# Patient Record
Sex: Female | Born: 1955
Health system: Southern US, Community
[De-identification: ages and names within clinical notes are randomized; demographics above are authoritative.]

## PROBLEM LIST (undated history)

## (undated) DIAGNOSIS — N870 Mild cervical dysplasia: Secondary | ICD-10-CM

## (undated) DIAGNOSIS — I10 Essential (primary) hypertension: Secondary | ICD-10-CM

## (undated) DIAGNOSIS — R7303 Prediabetes: Secondary | ICD-10-CM

## (undated) DIAGNOSIS — M7989 Other specified soft tissue disorders: Secondary | ICD-10-CM

## (undated) DIAGNOSIS — F32A Depression, unspecified: Secondary | ICD-10-CM

## (undated) DIAGNOSIS — M549 Dorsalgia, unspecified: Secondary | ICD-10-CM

## (undated) DIAGNOSIS — E785 Hyperlipidemia, unspecified: Secondary | ICD-10-CM

## (undated) DIAGNOSIS — M2550 Pain in unspecified joint: Secondary | ICD-10-CM

## (undated) DIAGNOSIS — F3281 Premenstrual dysphoric disorder: Secondary | ICD-10-CM

## (undated) DIAGNOSIS — M722 Plantar fascial fibromatosis: Secondary | ICD-10-CM

## (undated) DIAGNOSIS — E669 Obesity, unspecified: Secondary | ICD-10-CM

## (undated) DIAGNOSIS — K219 Gastro-esophageal reflux disease without esophagitis: Secondary | ICD-10-CM

## (undated) DIAGNOSIS — E7800 Pure hypercholesterolemia, unspecified: Secondary | ICD-10-CM

## (undated) DIAGNOSIS — M25569 Pain in unspecified knee: Secondary | ICD-10-CM

## (undated) DIAGNOSIS — M1990 Unspecified osteoarthritis, unspecified site: Secondary | ICD-10-CM

## (undated) DIAGNOSIS — F329 Major depressive disorder, single episode, unspecified: Secondary | ICD-10-CM

## (undated) DIAGNOSIS — K5900 Constipation, unspecified: Secondary | ICD-10-CM

## (undated) DIAGNOSIS — K589 Irritable bowel syndrome without diarrhea: Secondary | ICD-10-CM

## (undated) HISTORY — DX: Other specified soft tissue disorders: M79.89

## (undated) HISTORY — PX: ANAL FISSURE REPAIR: SHX2312

## (undated) HISTORY — DX: Irritable bowel syndrome, unspecified: K58.9

## (undated) HISTORY — DX: Hypercalcemia: E83.52

## (undated) HISTORY — PX: OTHER SURGICAL HISTORY: SHX169

## (undated) HISTORY — DX: Unspecified osteoarthritis, unspecified site: M19.90

## (undated) HISTORY — PX: TUBAL LIGATION: SHX77

## (undated) HISTORY — DX: Essential (primary) hypertension: I10

## (undated) HISTORY — DX: Plantar fascial fibromatosis: M72.2

## (undated) HISTORY — DX: Prediabetes: R73.03

## (undated) HISTORY — DX: Pain in unspecified knee: M25.569

## (undated) HISTORY — PX: ANKLE SURGERY: SHX546

## (undated) HISTORY — DX: Constipation, unspecified: K59.00

## (undated) HISTORY — DX: Mild cervical dysplasia: N87.0

## (undated) HISTORY — DX: Dorsalgia, unspecified: M54.9

## (undated) HISTORY — DX: Obesity, unspecified: E66.9

## (undated) HISTORY — DX: Depression, unspecified: F32.A

## (undated) HISTORY — DX: Pure hypercholesterolemia, unspecified: E78.00

## (undated) HISTORY — DX: Pain in unspecified joint: M25.50

## (undated) HISTORY — DX: Hyperlipidemia, unspecified: E78.5

## (undated) HISTORY — DX: Gastro-esophageal reflux disease without esophagitis: K21.9

## (undated) HISTORY — DX: Premenstrual dysphoric disorder: F32.81

## (undated) HISTORY — PX: COLPOSCOPY: SHX161

---
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
## 1998-03-22 ENCOUNTER — Other Ambulatory Visit: Admission: RE | Admit: 1998-03-22 | Discharge: 1998-03-22 | Payer: Self-pay | Admitting: Obstetrics and Gynecology

## 1999-03-21 ENCOUNTER — Inpatient Hospital Stay (HOSPITAL_COMMUNITY): Admission: AD | Admit: 1999-03-21 | Discharge: 1999-03-26 | Payer: Self-pay | Admitting: *Deleted

## 1999-05-24 ENCOUNTER — Other Ambulatory Visit: Admission: RE | Admit: 1999-05-24 | Discharge: 1999-05-24 | Payer: Self-pay | Admitting: Obstetrics and Gynecology

## 2000-06-04 ENCOUNTER — Other Ambulatory Visit: Admission: RE | Admit: 2000-06-04 | Discharge: 2000-06-04 | Payer: Self-pay | Admitting: Obstetrics and Gynecology

## 2001-08-20 ENCOUNTER — Encounter (INDEPENDENT_AMBULATORY_CARE_PROVIDER_SITE_OTHER): Payer: Self-pay | Admitting: Specialist

## 2001-08-20 ENCOUNTER — Ambulatory Visit (HOSPITAL_COMMUNITY): Admission: RE | Admit: 2001-08-20 | Discharge: 2001-08-20 | Payer: Self-pay | Admitting: Gastroenterology

## 2001-09-02 ENCOUNTER — Encounter: Payer: Self-pay | Admitting: *Deleted

## 2001-09-02 ENCOUNTER — Encounter: Admission: RE | Admit: 2001-09-02 | Discharge: 2001-09-02 | Payer: Self-pay | Admitting: *Deleted

## 2001-09-11 ENCOUNTER — Other Ambulatory Visit: Admission: RE | Admit: 2001-09-11 | Discharge: 2001-09-11 | Payer: Self-pay | Admitting: Obstetrics and Gynecology

## 2003-03-03 ENCOUNTER — Other Ambulatory Visit: Admission: RE | Admit: 2003-03-03 | Discharge: 2003-03-03 | Payer: Self-pay | Admitting: Obstetrics and Gynecology

## 2003-05-04 ENCOUNTER — Encounter: Payer: Self-pay | Admitting: Emergency Medicine

## 2003-05-04 ENCOUNTER — Emergency Department (HOSPITAL_COMMUNITY): Admission: EM | Admit: 2003-05-04 | Discharge: 2003-05-04 | Payer: Self-pay | Admitting: Emergency Medicine

## 2004-04-27 ENCOUNTER — Other Ambulatory Visit: Admission: RE | Admit: 2004-04-27 | Discharge: 2004-04-27 | Payer: Self-pay | Admitting: Obstetrics and Gynecology

## 2004-08-19 HISTORY — PX: CERVICAL BIOPSY  W/ LOOP ELECTRODE EXCISION: SUR135

## 2004-11-02 ENCOUNTER — Ambulatory Visit (HOSPITAL_COMMUNITY): Admission: RE | Admit: 2004-11-02 | Discharge: 2004-11-02 | Payer: Self-pay | Admitting: Obstetrics and Gynecology

## 2004-11-02 ENCOUNTER — Ambulatory Visit (HOSPITAL_BASED_OUTPATIENT_CLINIC_OR_DEPARTMENT_OTHER): Admission: RE | Admit: 2004-11-02 | Discharge: 2004-11-02 | Payer: Self-pay | Admitting: Obstetrics and Gynecology

## 2004-11-02 ENCOUNTER — Encounter (INDEPENDENT_AMBULATORY_CARE_PROVIDER_SITE_OTHER): Payer: Self-pay | Admitting: *Deleted

## 2005-03-04 ENCOUNTER — Other Ambulatory Visit: Admission: RE | Admit: 2005-03-04 | Discharge: 2005-03-04 | Payer: Self-pay | Admitting: Obstetrics and Gynecology

## 2005-05-09 ENCOUNTER — Ambulatory Visit (HOSPITAL_BASED_OUTPATIENT_CLINIC_OR_DEPARTMENT_OTHER): Admission: RE | Admit: 2005-05-09 | Discharge: 2005-05-09 | Payer: Self-pay | Admitting: General Surgery

## 2005-05-09 ENCOUNTER — Ambulatory Visit (HOSPITAL_COMMUNITY): Admission: RE | Admit: 2005-05-09 | Discharge: 2005-05-09 | Payer: Self-pay | Admitting: General Surgery

## 2005-06-20 ENCOUNTER — Other Ambulatory Visit: Admission: RE | Admit: 2005-06-20 | Discharge: 2005-06-20 | Payer: Self-pay | Admitting: Obstetrics and Gynecology

## 2005-12-06 ENCOUNTER — Other Ambulatory Visit: Admission: RE | Admit: 2005-12-06 | Discharge: 2005-12-06 | Payer: Self-pay | Admitting: Obstetrics and Gynecology

## 2006-07-03 ENCOUNTER — Other Ambulatory Visit: Admission: RE | Admit: 2006-07-03 | Discharge: 2006-07-03 | Payer: Self-pay | Admitting: Obstetrics and Gynecology

## 2007-11-02 ENCOUNTER — Other Ambulatory Visit: Admission: RE | Admit: 2007-11-02 | Discharge: 2007-11-02 | Payer: Self-pay | Admitting: Obstetrics and Gynecology

## 2007-12-10 ENCOUNTER — Other Ambulatory Visit: Admission: RE | Admit: 2007-12-10 | Discharge: 2007-12-10 | Payer: Self-pay | Admitting: Obstetrics and Gynecology

## 2008-06-16 ENCOUNTER — Other Ambulatory Visit: Admission: RE | Admit: 2008-06-16 | Discharge: 2008-06-16 | Payer: Self-pay | Admitting: Obstetrics and Gynecology

## 2008-06-16 ENCOUNTER — Encounter: Payer: Self-pay | Admitting: Obstetrics and Gynecology

## 2008-06-16 ENCOUNTER — Ambulatory Visit: Payer: Self-pay | Admitting: Obstetrics and Gynecology

## 2008-11-03 ENCOUNTER — Other Ambulatory Visit: Admission: RE | Admit: 2008-11-03 | Discharge: 2008-11-03 | Payer: Self-pay | Admitting: Obstetrics and Gynecology

## 2008-11-03 ENCOUNTER — Ambulatory Visit: Payer: Self-pay | Admitting: Obstetrics and Gynecology

## 2008-11-03 ENCOUNTER — Encounter: Payer: Self-pay | Admitting: Obstetrics and Gynecology

## 2009-08-07 ENCOUNTER — Ambulatory Visit: Payer: Self-pay | Admitting: Obstetrics and Gynecology

## 2009-08-16 ENCOUNTER — Ambulatory Visit: Payer: Self-pay | Admitting: Obstetrics and Gynecology

## 2009-11-09 ENCOUNTER — Other Ambulatory Visit: Admission: RE | Admit: 2009-11-09 | Discharge: 2009-11-09 | Payer: Self-pay | Admitting: Obstetrics and Gynecology

## 2009-11-09 ENCOUNTER — Ambulatory Visit: Payer: Self-pay | Admitting: Obstetrics and Gynecology

## 2009-12-14 ENCOUNTER — Ambulatory Visit: Payer: Self-pay | Admitting: Obstetrics and Gynecology

## 2010-06-15 ENCOUNTER — Ambulatory Visit: Payer: Self-pay | Admitting: Obstetrics and Gynecology

## 2010-11-14 ENCOUNTER — Other Ambulatory Visit (HOSPITAL_COMMUNITY)
Admission: RE | Admit: 2010-11-14 | Discharge: 2010-11-14 | Disposition: A | Discharge status: Home / Self Care | Payer: Managed Care, Other (non HMO) | Source: Ambulatory Visit | Attending: Obstetrics and Gynecology | Admitting: Obstetrics and Gynecology

## 2010-11-14 ENCOUNTER — Other Ambulatory Visit: Payer: Self-pay | Admitting: Obstetrics and Gynecology

## 2010-11-14 ENCOUNTER — Encounter (INDEPENDENT_AMBULATORY_CARE_PROVIDER_SITE_OTHER): Payer: Managed Care, Other (non HMO) | Admitting: Obstetrics and Gynecology

## 2010-11-14 DIAGNOSIS — Z124 Encounter for screening for malignant neoplasm of cervix: Secondary | ICD-10-CM | POA: Insufficient documentation

## 2010-11-14 DIAGNOSIS — R82998 Other abnormal findings in urine: Secondary | ICD-10-CM

## 2010-11-14 DIAGNOSIS — Z01419 Encounter for gynecological examination (general) (routine) without abnormal findings: Secondary | ICD-10-CM

## 2011-01-04 NOTE — Op Note (Signed)
[name], [name]           ACCOUNT NO.:  [id number]   MEDICAL RECORD NO.:  [id number]          PATIENT TYPE:  AMB   LOCATION:  [facility]                         FACILITY:  [facility], [place]   PHYSICIAN:  Daniel L. Gottsegen, M.D.DATE OF BIRTH:  1956/07/27   DATE OF PROCEDURE:  11/02/2004  DATE OF DISCHARGE:                                 OPERATIVE REPORT   PREOPERATIVE DIAGNOSIS:  CIN.   POSTOPERATIVE DIAGNOSIS:  CIN.   OPERATIONS:  Cervical LEEP.   SURGEON:  Dr. [name].   ANESTHESIA:  General.   INDICATIONS:  The patient is a 54 year old female, who presented to the  office with abnormal Pap smears consistent with CIN; colposcopy and biopsy  confirmed CIN.  We had initially elected to try the LEEP the area of CIN in  the office but because of her close approximation of the vaginal walls to  the cervix and not being able to get adequate separation without anesthesia  and concern about injuring the vaginal wall, it was elected to do it here  with general.   FINDINGS:  At the time of surgery today, the patient underwent a colposcopy  as she had in the office.  Colposcopic findings were the same as in the  office.  There was white epithelium from 9 to 5 o'clock.  It was in a fairly  focal area.  The squamocolumnar junction was well-visualized.  All of the  transformation zone could be visualized.   PROCEDURE:  After adequate general anesthesia, the patient was placed in the  dorsal supine position, prepped and draped in the usual sterile manner.  The  patient was colposcoped with 4% acetic acid, and then a LEEP procedure was  done with a 20 x 12 loop.  Settings were 60 coag, 60 cutting, blend one.  An  adequate loop was done using a coated specimen to keep the loop away from  the vaginal walls, even though it was felt that we had gone lateral enough  on the first swipe, it appeared that we needed to take more tissue at the 9  o'clock position, so a second pass was done there, and  both specimens were  sent to pathology for tissue diagnosis.  The main specimen was cut at 12  o'clock for orientation.  A ball tip was then placed after the LEEP had been  done, and the entire raw area was cauterized.  At the termination of the  procedure, there was no bleeding noted.  The patient tolerated the procedure  well and left the operating room in satisfactory condition.  Estimated blood  loss was 10 mL with none replaced.      DLG/MEDQ  D:  11/02/2004  T:  11/02/2004  Job:  [id number]

## 2011-01-04 NOTE — Op Note (Signed)
Tiffany Norton, Tiffany Norton           ACCOUNT NO.:  [id number]   MEDICAL RECORD NO.:  [id number]          PATIENT TYPE:  AMB   LOCATION:  [facility]                          FACILITY:  [facility]   PHYSICIAN:  Gabrielle Dare. Janee Morn, M.D.DATE OF BIRTH:  1955-10-14   DATE OF PROCEDURE:  05/09/2005  DATE OF DISCHARGE:                                 OPERATIVE REPORT   PREOPERATIVE DIAGNOSIS:  Anal fissure.   POSTOPERATIVE DIAGNOSIS:  Anal fissure.   PROCEDURES:  1.  Lateral internal sphincterotomy.  2.  Repair anal fissure.   SURGEON:  Gabrielle Dare. Janee Morn, M.D.   ANESTHESIA:  General.   HISTORY OF PRESENT ILLNESS:  The patient is a 55 year old female who I saw  in the office for a posterior midline anal fissure. This was initially  treated with diltiazem ointment topically, and it worsened despite this  treatment. She now presents for a lateral internal sphincterotomy and repair  for a fissure.   PROCEDURE IN DETAIL:  Informed consent was obtained; the patient was  identified. She received intravenous antibiotics. She was brought to the  operating room. General anesthesia was administered. She was placed in  lithotomy position. Her perianal area was prepped and draped in sterile  fashion.  Marcaine 0.5% with epinephrine was injected in her perianal region  for local anesthetic.  Inspection revealed a posterior midline anal fissure  that has not healed, and a relatively tight sphincter.  Gentle digital  dilatation was then done up to three fingers, using generous lubricant.  Subsequently her intersphincteric groove was palpated on the left side.  A  15-blade scalpel was inserted into the intersphincteric groove, and then the  blade was turned towards the anal canal.  Using palpation, the fibers of the  internal sphincter were divided.  This resulted in good loosening of the  sphincter area. There was no bleeding from that side. Subsequently the anal  fissure was cleaned off, debrided, and then closed  with a running 3-0  chromic suture.  No other indirect abnormalities were noted.  Some pressure  was held for a minute or so.  There was good hemostasis. A Gelfoam sponge  was placed in the anal canal for postoperative dressing, and an additional  sterile dressing was placed on top of that.  Sponge, needle and instrument  counts were correct. The patient tolerated procedure well without apparent  complication; was taken to the recovery room in stable condition.      Gabrielle Dare Janee Morn, M.D.  Electronically Signed     BET/MEDQ  D:  05/09/2005  T:  05/10/2005  Job:  [id number]

## 2011-01-04 NOTE — Procedures (Signed)
[facility]  Patient:    Tiffany Norton, Tiffany Norton Visit Number: [id number] MRN: [id number]          Service Type: END Location: ENDO Attending Physician:  Nelda Marseille Dictated by:   Petra Kuba, M.D. Proc. Date: 08/20/01 Admit Date:  08/20/2001   CC:         Vanita Panda, M.D.   Procedure Report  PROCEDURE:  Colonoscopy with biopsy.  INDICATIONS FOR PROCEDURE:  Bright red blood per rectum, increasing diarrhea, family history of colon cancer.  Consent was signed after risks, benefits, methods, and options were thoroughly discussed in the office.  MEDICINES USED:  Demerol 100, Versed 10.  DESCRIPTION OF PROCEDURE:  Rectal inspection was pertinent for external hemorrhoids. Digital exam was negative. The pediatric adjustable video colonoscope was inserted, fairly easily advanced around the colon to the cecum. This did not require any position changes but some mild abdominal pressure. No obvious abnormality was seen on insertion. The scope was inserted a short ways into the terminal ileum which was normal. Photo documentation and scattered biopsies were obtained and put in the first container. The scope was slowly withdrawn. On slow withdrawal through the colon, the prep was adequate. There was some liquid stool that required washing and suctioning. Random biopsies were obtained a few from all areas as we slowly withdrew back to the rectum, but no polypoid lesions, masses, or other abnormalities were seen. Once back in the rectum, the scope was retroflexed pertinent for some internal hemorrhoids. The scope was straightened, air was suctioned, scope removed. The patient tolerated the procedure adequately and there was no obvious immediate complication.  ENDOSCOPIC DIAGNOSIS:  1. Internal/external hemorrhoids most likely cause of bleeding.  2. Otherwise within normal limits to the terminal ileum status post random     biopsies  throughout.  PLAN:  Follow-up p.r.n. or in two months. Consider an upper GI small bowel follow-through. Will hold off for now and wait on her biopsies. Recommend repeat colon screening at age 23 and would treat hemorrhoids at the time of follow-up. Have her call me sooner p.r.n. Dictated by:   Petra Kuba, M.D. Attending Physician:  Nelda Marseille DD:  08/20/01 TD:  08/21/01 Job: [id number]

## 2011-04-29 ENCOUNTER — Other Ambulatory Visit: Payer: Self-pay | Admitting: Gastroenterology

## 2011-04-29 DIAGNOSIS — R197 Diarrhea, unspecified: Secondary | ICD-10-CM

## 2011-04-30 ENCOUNTER — Ambulatory Visit
Admission: RE | Admit: 2011-04-30 | Discharge: 2011-04-30 | Disposition: A | Discharge status: Home / Self Care | Payer: Managed Care, Other (non HMO) | Source: Ambulatory Visit | Attending: Gastroenterology | Admitting: Gastroenterology

## 2011-04-30 DIAGNOSIS — R197 Diarrhea, unspecified: Secondary | ICD-10-CM

## 2011-04-30 MED ORDER — IOHEXOL 300 MG/ML  SOLN
125.0000 mL | Freq: Once | INTRAMUSCULAR | Status: AC | PRN
  Administered 2011-04-30: 125 mL via INTRAVENOUS

## 2011-06-07 ENCOUNTER — Other Ambulatory Visit: Payer: Self-pay | Admitting: Gastroenterology

## 2011-11-20 ENCOUNTER — Other Ambulatory Visit: Payer: Self-pay | Admitting: Obstetrics and Gynecology

## 2011-11-25 ENCOUNTER — Encounter: Payer: Self-pay | Admitting: Gynecology

## 2011-11-25 DIAGNOSIS — F3281 Premenstrual dysphoric disorder: Secondary | ICD-10-CM | POA: Insufficient documentation

## 2011-11-25 DIAGNOSIS — N870 Mild cervical dysplasia: Secondary | ICD-10-CM | POA: Insufficient documentation

## 2011-11-25 DIAGNOSIS — K589 Irritable bowel syndrome without diarrhea: Secondary | ICD-10-CM | POA: Insufficient documentation

## 2011-11-28 ENCOUNTER — Encounter: Payer: Managed Care, Other (non HMO) | Admitting: Obstetrics and Gynecology

## 2011-11-28 ENCOUNTER — Other Ambulatory Visit: Payer: Self-pay | Admitting: Obstetrics and Gynecology

## 2011-12-09 ENCOUNTER — Encounter: Payer: Self-pay | Admitting: Obstetrics and Gynecology

## 2011-12-31 ENCOUNTER — Other Ambulatory Visit: Payer: Self-pay | Admitting: Obstetrics and Gynecology

## 2012-01-21 ENCOUNTER — Ambulatory Visit (INDEPENDENT_AMBULATORY_CARE_PROVIDER_SITE_OTHER): Payer: Managed Care, Other (non HMO) | Admitting: Obstetrics and Gynecology

## 2012-01-21 ENCOUNTER — Encounter: Payer: Self-pay | Admitting: Obstetrics and Gynecology

## 2012-01-21 VITALS — BP 120/74 | Ht 66.0 in | Wt 280.0 lb

## 2012-01-21 DIAGNOSIS — E7800 Pure hypercholesterolemia, unspecified: Secondary | ICD-10-CM | POA: Insufficient documentation

## 2012-01-21 DIAGNOSIS — E7849 Other hyperlipidemia: Secondary | ICD-10-CM | POA: Insufficient documentation

## 2012-01-21 DIAGNOSIS — Z01419 Encounter for gynecological examination (general) (routine) without abnormal findings: Secondary | ICD-10-CM

## 2012-01-21 MED ORDER — VENLAFAXINE HCL 75 MG PO TABS: ORAL_TABLET | ORAL | Status: DC

## 2012-01-21 NOTE — Progress Notes (Signed)
Patient came to see me today for her annual GYN exam. She has been off her HRT for one month and so far is having no symptoms. She does take Effexor and I suspect that maybe masking menopausal symptoms. She did have withdrawal bleeding when she was on her HRT but has had none since she stopped it. She has had trouble with depression and this has helped it. She is having no pelvic pain. She had a LEEP in March of 2006 for her persistent CIN. She has had 9 normal Pap smear since then. She did have one Pap smear that showed ASCUS with high-risk HPV typing was negative. She had a normal bone density in 2009. She does her lab work through her PCP. She is having no bladder symptoms. She is up-to-date on her mammograms.  Physical examination: Kennon Portela present. HEENT within normal limits. Neck: Thyroid not large. No masses. Supraclavicular nodes: not enlarged. Breasts: Examined in both sitting and lying  position. No skin changes and no masses. Abdomen: Soft no guarding rebound or masses or hernia. Pelvic: External: Within normal limits. BUS: Within normal limits. Vaginal:within normal limits. Good estrogen effect. No evidence of cystocele rectocele or enterocele. Cervix: clean. Uterus: Normal size and shape. Adnexa: No masses. Rectovaginal exam: Confirmatory and negative. Extremities: Within normal limits.  Assessment: Menopausal symptoms now resolved. Depression-doing well on Effexor. CIN-1.  Plan: Continue yearly mammograms. Continue Effexor. Do not restart estrogen. Assuming she is still off estrogen next year bone density.

## 2013-02-19 ENCOUNTER — Other Ambulatory Visit: Payer: Self-pay | Admitting: Obstetrics and Gynecology

## 2013-03-18 ENCOUNTER — Encounter: Payer: Self-pay | Admitting: Women's Health

## 2013-04-01 ENCOUNTER — Other Ambulatory Visit: Payer: Self-pay | Admitting: Gynecology

## 2013-04-21 ENCOUNTER — Encounter: Payer: Self-pay | Admitting: Women's Health

## 2013-05-06 ENCOUNTER — Encounter: Payer: Self-pay | Admitting: Women's Health

## 2013-05-06 ENCOUNTER — Ambulatory Visit (INDEPENDENT_AMBULATORY_CARE_PROVIDER_SITE_OTHER): Payer: Managed Care, Other (non HMO) | Admitting: Women's Health

## 2013-05-06 ENCOUNTER — Other Ambulatory Visit (HOSPITAL_COMMUNITY)
Admission: RE | Admit: 2013-05-06 | Discharge: 2013-05-06 | Disposition: A | Discharge status: Home / Self Care | Payer: Managed Care, Other (non HMO) | Source: Ambulatory Visit | Attending: Gynecology | Admitting: Gynecology

## 2013-05-06 VITALS — BP 124/78 | Ht 66.0 in | Wt 275.0 lb

## 2013-05-06 DIAGNOSIS — Z01419 Encounter for gynecological examination (general) (routine) without abnormal findings: Secondary | ICD-10-CM | POA: Insufficient documentation

## 2013-05-06 DIAGNOSIS — Z780 Asymptomatic menopausal state: Secondary | ICD-10-CM

## 2013-05-06 NOTE — Patient Instructions (Addendum)

## 2013-05-06 NOTE — Progress Notes (Signed)
Tiffany Norton 1956-04-19 [id number]    History:    The patient presents for annual exam. Postmenopause/no bleeding/stopped HRT one year ago doing fine symptom wise. 2006 LEEP for a persistent CIN1, normal Pap history since. Normal mammogram history, last mammogram 11/2011. Normal 2009 DEXA T score total left forearm -.4. 2013 colonoscopy, negative. Depression stable  on Effexor 75 mg daily. Per primary care, scheduled to see nutritionist regarding diet and weight loss.  Past medical history, past surgical history, family history and social history were all reviewed and documented in the EPIC chart. Depression, hypercholesterolemia, IBS. Mother: Ovarian cancer, colon cancer, hypertension. Father: Aortic aneurysm. 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 One daughter, one grandson 3, both doing well.   ROS:  A  ROS was performed and pertinent positives and negatives are included in the history.  Exam:  Filed Vitals:   05/06/13 1359  BP: 124/78    General appearance:  Normal Head/Neck:  Normal, without cervical or supraclavicular adenopathy. Thyroid:  Symmetrical, normal in size, without palpable masses or nodularity. Respiratory  Effort:  Normal  Auscultation:  Clear without wheezing or rhonchi Cardiovascular  Auscultation:  Regular rate, without rubs, murmurs or gallops  Edema/varicosities:  Not grossly evident Abdominal  Soft,nontender, without masses, guarding or rebound.  Liver/spleen:  No organomegaly noted  Hernia:  None appreciated  Skin  Inspection:  Grossly normal  Palpation:  Grossly normal Neurologic/psychiatric  Orientation:  Normal with appropriate conversation.  Mood/affect:  Normal  Genitourinary    Breasts: Examined lying and sitting.     Right: Without masses, retractions, discharge or axillary adenopathy.     Left: Without masses, retractions, discharge or axillary adenopathy.   Inguinal/mons:  Normal without inguinal adenopathy  External genitalia:   Normal  BUS/Urethra/Skene's glands:  Normal  Bladder:  Normal  Vagina:  Normal  Cervix:  Normal  Uterus: normal in size, shape and contour.  Midline and mobile  Adnexa/parametria:     Rt: Without masses or tenderness.   Lt: Without masses or tenderness.  Anus and perineum: Normal  Digital rectal exam: Normal sphincter tone without palpated masses or tenderness  Assessment/Plan:  57 y.o. G1P1 MWF for annual exam.    Postmenopausal on no HRT/no bleeding History of CIN-1, LEEP 2006, normal Pap history since Hypercholesterolemia/Depression labs and meds managed primary care Morbid obesity   Plan: Pap, 2012 pap normal, new guidelines reviewed, UA, DEXA scan, will schedule here. Schedule and reviewed importance of annual mammogram, SBE's, vitamin D 2000mg  daily. Follow diet  given by nutritionist for weight loss, regular exercise encouraged.       Harrington Challenger [facility], 2:36 PM 05/06/2013

## 2013-05-07 ENCOUNTER — Encounter: Payer: Managed Care, Other (non HMO) | Attending: Family Medicine | Admitting: Dietician

## 2013-05-07 VITALS — Ht 65.0 in | Wt 275.7 lb

## 2013-05-07 DIAGNOSIS — Z713 Dietary counseling and surveillance: Secondary | ICD-10-CM | POA: Insufficient documentation

## 2013-05-07 DIAGNOSIS — E669 Obesity, unspecified: Secondary | ICD-10-CM

## 2013-05-07 LAB — URINALYSIS W MICROSCOPIC + REFLEX CULTURE
Casts: NONE SEEN
Crystals: NONE SEEN
Glucose, UA: NEGATIVE mg/dL
Hgb urine dipstick: NEGATIVE
Ketones, ur: NEGATIVE mg/dL
Leukocytes, UA: NEGATIVE
Nitrite: NEGATIVE
Specific Gravity, Urine: 1.023 (ref 1.005–1.030)
pH: 5.5 (ref 5.0–8.0)

## 2013-05-07 NOTE — Progress Notes (Signed)
  Medical Nutrition Therapy:  Appt start time: 0830 end time:  0930.   Assessment:  Primary concerns today: Tiffany Norton is here today since she has pre-diabetes.     Becky lives with her husband and works as a Public relations account executive. 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 Was on a prepared diet food plan (like Doylene Bode) for 5 months and lost 20 pounds and gained some back recently. Concerned that husband needs to eat different foods than she does when she is "on a diet".   Will eat meals out 4-5 days a week now that children are out of the home.   MEDICATIONS: see list    DIETARY INTAKE:  24-hr recall:  B ( AM): skips 2-3 x week or bowl of cereal such as Early [place] or Special K with 2% milk with coffee with creamer Snk ( AM): none  L ( PM): sandwich with chips and a diet drink OR salad  Snk ( PM): none D ( PM): chicken with a lot of pasta and vegetables with water or wine  Snk ( PM): popcorn Beverages: mostly water   Usual physical activity: none  Estimated energy needs: 1600 calories 180 g carbohydrates 120 g protein 44 g fat  Progress Towards Goal(s):  In progress.   Nutritional Diagnosis:  NB-1.1 Food and nutrition-related knowledge deficit As related to frequent restaurant meals and excessive CHO intake.  As evidenced by Hgb A1c of 6.2% and BMI of 45.88.    Intervention:  Nutrition counseling provided. Discussed the pathophysiology of insulin resistance and factors that affect blood sugar such as carbohydrate intake, weight gain/loss, stress and exercise. Recommended spreading carbohydrates out throughout the day, watching portion sizes, and adding exercise to her routine. Discussed mindful eating techniques to help with weight control.   Handouts given during visit include:  Living Well with Diabetes  15g CHO Snacks  MyPlate  Yellow Card  Monitoring/Evaluation:  Dietary intake, exercise, mindful eating, and body weight in 6 week(s).

## 2013-05-07 NOTE — Patient Instructions (Addendum)
Choose cereal less than 10 g of sugar per serving and 4 g fiber or more.  Consider switching to 1% or skim milk. Consider adding 2-3 snacks with protein per day.  Add frozen vegetables or ready to eat vegetables to meals to fill half of your plate. Limit starches to a quarter of the plate. Consider eating all meals at a table with no distractions (TV) and try to take 20 minutes to eat. Serve smaller portions of food and eat more if you are still hungry after 20 minutes. Aim to get 30 minutes of exercise 5 x week.  Use Calorie Brooke Dare to check out restaurant meals ahead of time.

## 2013-05-10 ENCOUNTER — Encounter: Payer: Self-pay | Admitting: Obstetrics and Gynecology

## 2013-05-11 ENCOUNTER — Ambulatory Visit (INDEPENDENT_AMBULATORY_CARE_PROVIDER_SITE_OTHER): Payer: Managed Care, Other (non HMO)

## 2013-05-11 ENCOUNTER — Other Ambulatory Visit: Payer: Self-pay | Admitting: Gynecology

## 2013-05-11 ENCOUNTER — Encounter: Payer: Self-pay | Admitting: Obstetrics and Gynecology

## 2013-05-11 DIAGNOSIS — Z780 Asymptomatic menopausal state: Secondary | ICD-10-CM

## 2013-05-11 DIAGNOSIS — Z13820 Encounter for screening for osteoporosis: Secondary | ICD-10-CM

## 2013-06-18 ENCOUNTER — Ambulatory Visit: Payer: Managed Care, Other (non HMO) | Admitting: Dietician

## 2013-06-24 ENCOUNTER — Other Ambulatory Visit: Payer: Self-pay

## 2013-06-28 ENCOUNTER — Encounter: Payer: Self-pay | Admitting: Women's Health

## 2013-07-31 IMAGING — CT CT ABD-PELV W/ CM
3 of 5 series · 13 of 36 positions shown, 19 images · IV contrast (omnipaque)
Comparison: None.

CLINICAL DATA: Abdominal pain, diarrhea

CT ABDOMEN AND PELVIS WITH CONTRAST
TECHNIQUE: Multidetector CT imaging of the abdomen and pelvis was
performed following the standard protocol during bolus
administration of intravenous contrast.
Contrast: 125mL OMNIPAQUE IOHEXOL 300 MG/ML IV SOLN

[Series 3: abd/pelvis with · axial · 0.89mm/px · z∈[-423,-93]mm · 7 of 90 slices shown, 12 images]
[im 12/90  soft-tissue]
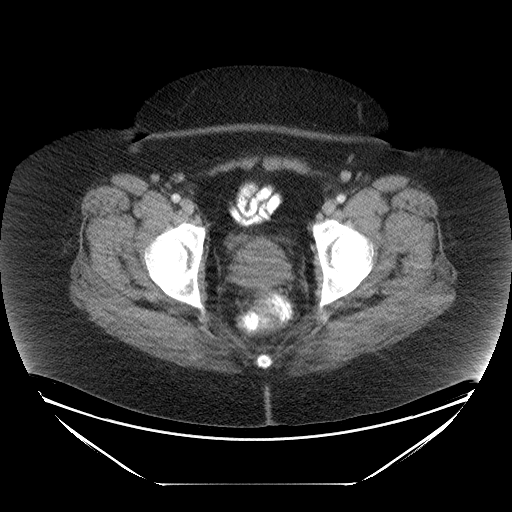
[im 12/90  bone]
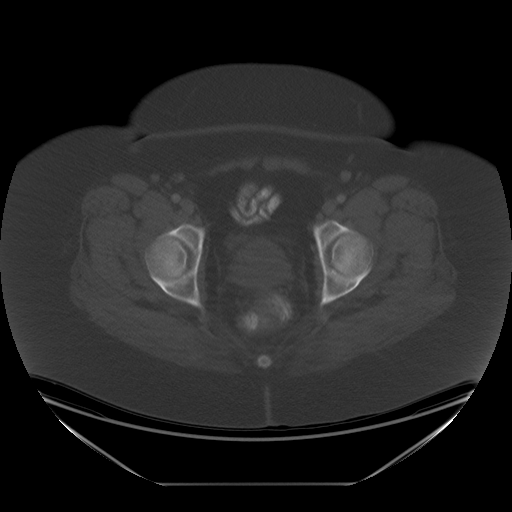
[im 23/90  soft-tissue]
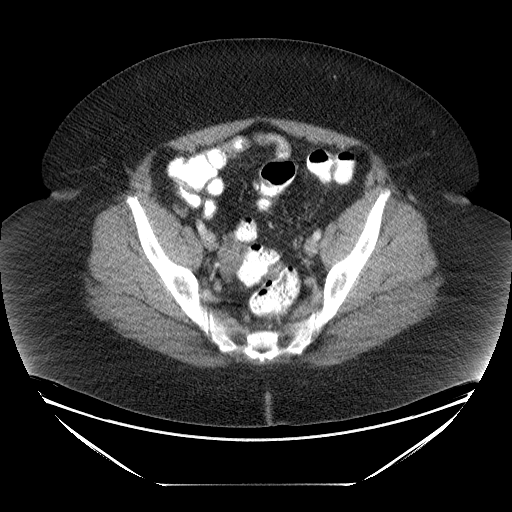
[im 34/90  soft-tissue]
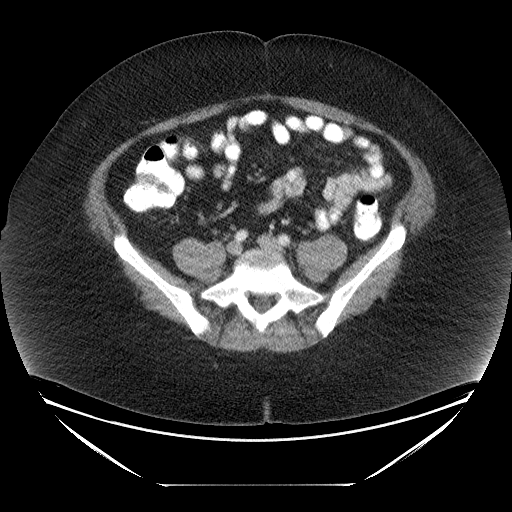
[im 45/90  soft-tissue]
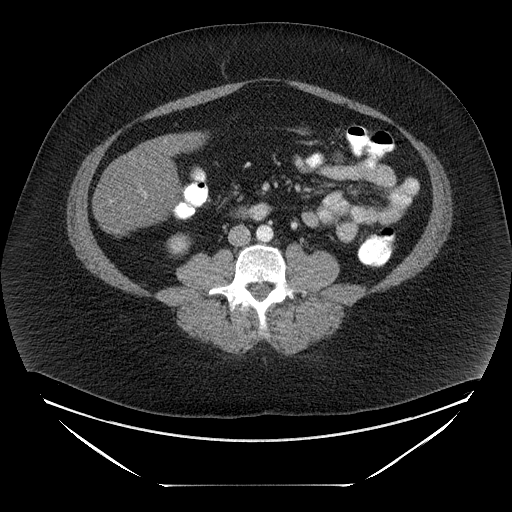
[im 45/90  lung]
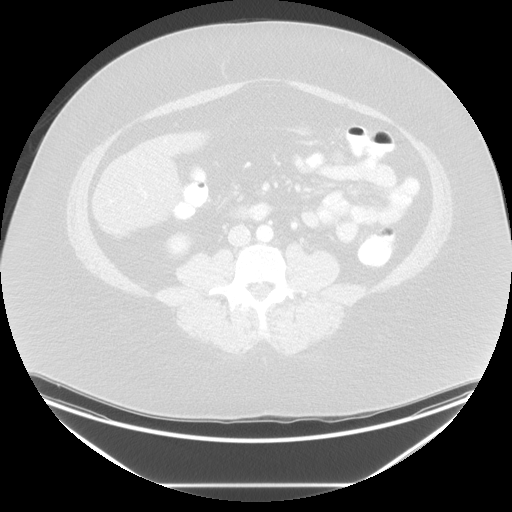
[im 56/90  soft-tissue]
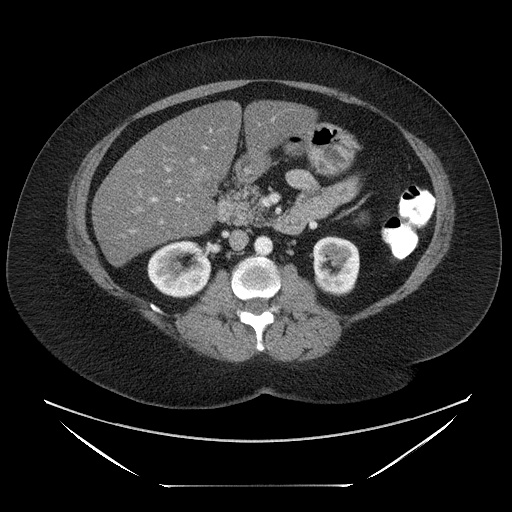
[im 56/90  lung]
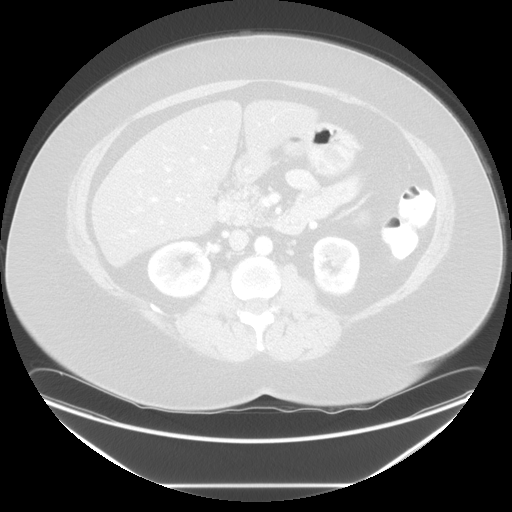
[im 67/90  soft-tissue]
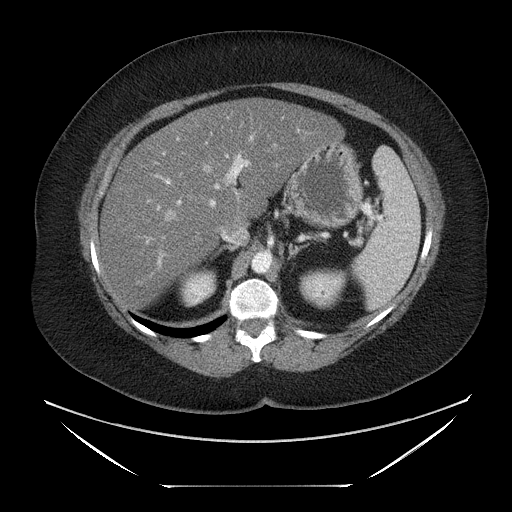
[im 67/90  lung]
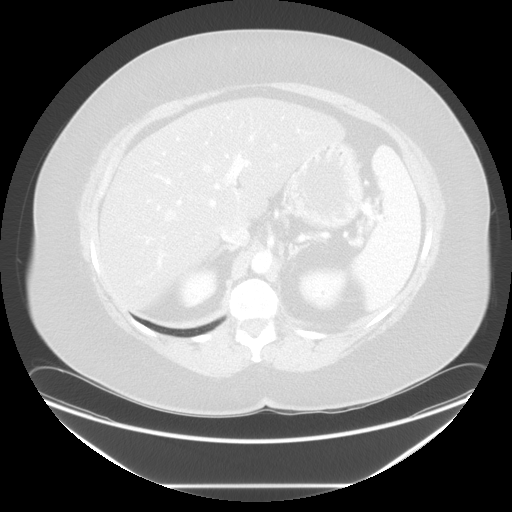
[im 78/90  soft-tissue]
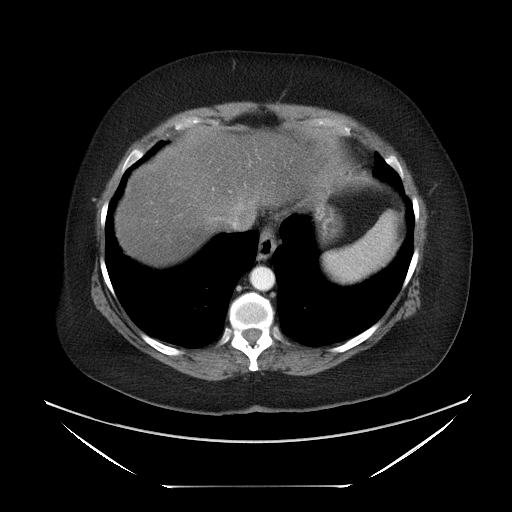
[im 78/90  lung]
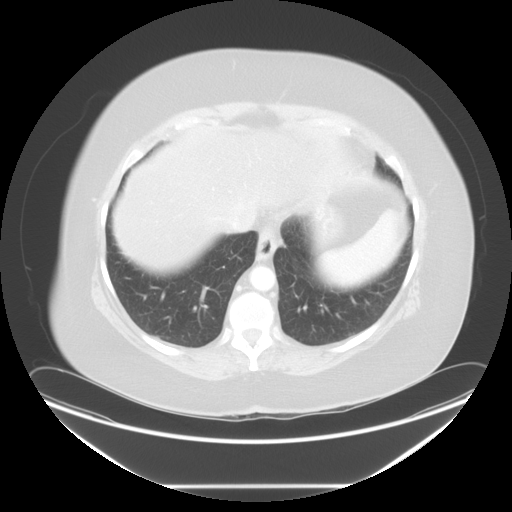

[Series 601: coronal body · coronal · 0.91mm/px · 1 of 147 slices shown, 2 images]
[im 49/147  soft-tissue]
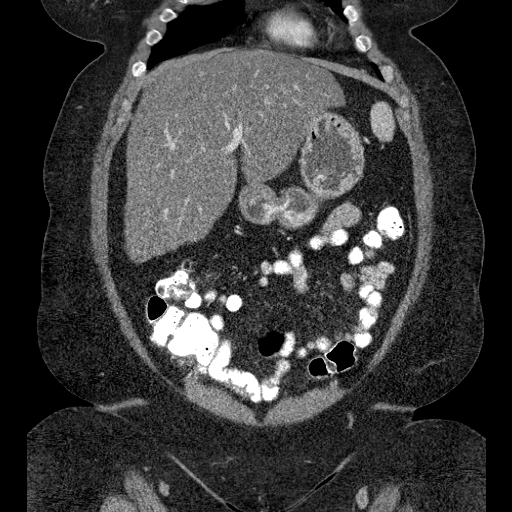
[im 49/147  bone]
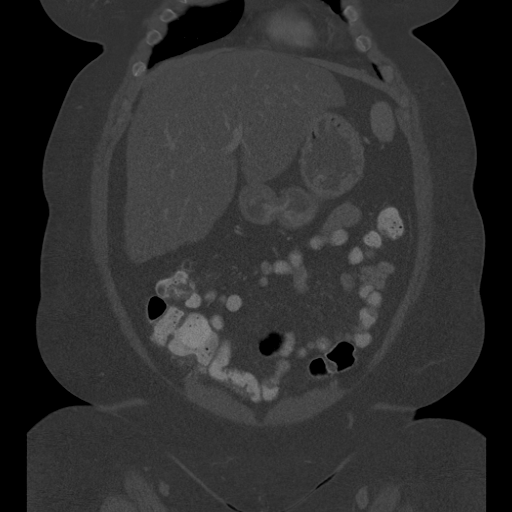

[Series 602: sagittal body · sagittal · 0.91mm/px · 5 of 184 slices shown]
[im 22/184  soft-tissue]
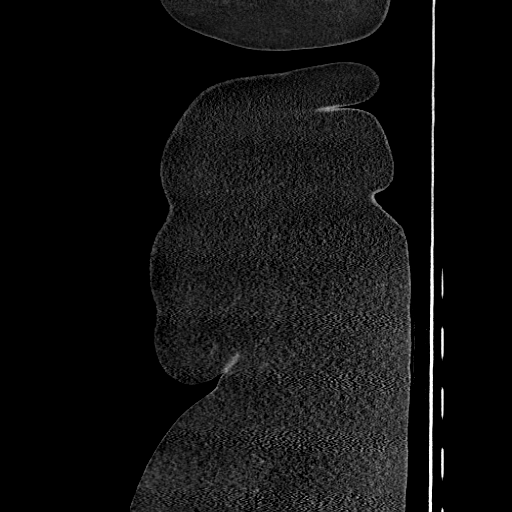
[im 44/184  soft-tissue]
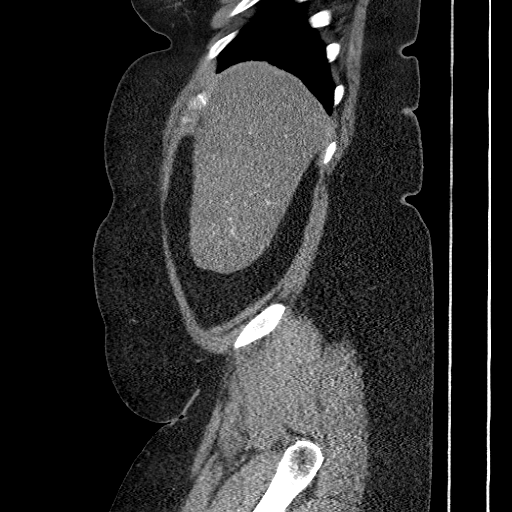
[im 65/184  soft-tissue]
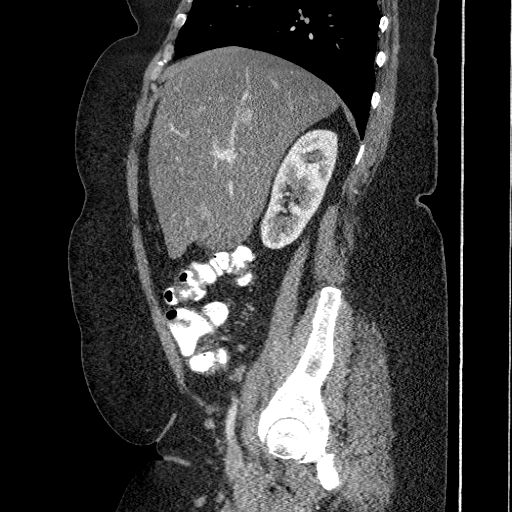
[im 87/184  soft-tissue]
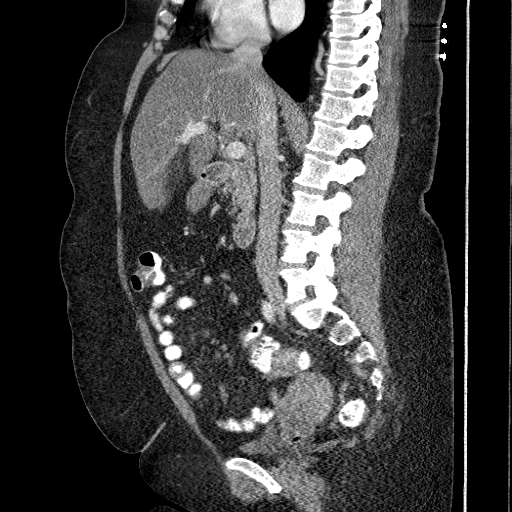
[im 108/184  soft-tissue]
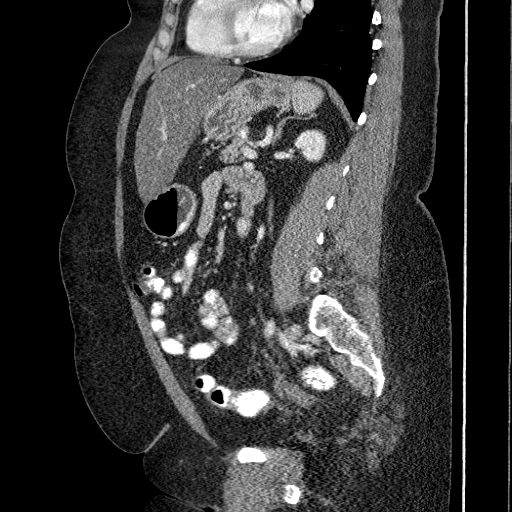

[13 of 36 positions shown; findings below may reference images not displayed]

FINDINGS: The lung bases are clear.  The liver is diffusely lower
in attenuation consistent with fatty infiltration with some sparing
near the gallbladder.  No ductal dilatation is seen.  No calcified
gallstones are noted.  The pancreas is normal in size and the
pancreatic duct is not dilated.  The adrenal glands and spleen are
unremarkable.  The stomach is not well distended but is
unremarkable.  The kidneys enhance with no calculus or mass and no
hydronephrosis is seen.  The abdominal aorta is normal in caliber.
Small retroperitoneal lymph nodes are noted.  The uterus is normal
in size.  The urinary bladder is decompressed.  No adnexal lesion
is seen.  No fluid is noted within the pelvis.  The appendix and
terminal ileum appear normal.  No abnormality of the colon is seen,
but the colon is not well distended. No bony abnormality is noted.
IMPRESSION: 1.  Fatty infiltration of the liver with areas of sparing near the
gallbladder.  No calcified gallstones.
2.  The appendix and terminal ileum are unremarkable.
3.  No abdominal or pelvic mass or adenopathy.

## 2014-06-03 ENCOUNTER — Other Ambulatory Visit: Payer: Self-pay

## 2014-06-20 ENCOUNTER — Encounter: Payer: Self-pay | Admitting: Women's Health

## 2014-10-27 ENCOUNTER — Ambulatory Visit (INDEPENDENT_AMBULATORY_CARE_PROVIDER_SITE_OTHER): Payer: Managed Care, Other (non HMO) | Admitting: Women's Health

## 2014-10-27 ENCOUNTER — Encounter: Payer: Self-pay | Admitting: Women's Health

## 2014-10-27 ENCOUNTER — Other Ambulatory Visit (HOSPITAL_COMMUNITY)
Admission: RE | Admit: 2014-10-27 | Discharge: 2014-10-27 | Disposition: A | Discharge status: Home / Self Care | Payer: Managed Care, Other (non HMO) | Source: Ambulatory Visit | Attending: Gynecology | Admitting: Gynecology

## 2014-10-27 VITALS — BP 136/80 | Ht 66.0 in | Wt 284.0 lb

## 2014-10-27 DIAGNOSIS — Z01419 Encounter for gynecological examination (general) (routine) without abnormal findings: Secondary | ICD-10-CM | POA: Diagnosis not present

## 2014-10-27 NOTE — Progress Notes (Signed)
Governor SpeckingRebecca G Garciagarcia 08/24/1955 [id number]    History:    Presents for annual exam.  Postmenopausal/no HRT/no bleeding. 2006 LEEP for persistent CIN-1 with normal Paps after. Normal mammogram history. 04/2013 normal DEXA hip average +0.7. Mother died of colon cancer 2970 with metastasis to ovaries. 2013 negative colonoscopy. Hypertension/hypercholesterolemia/depression-primary care manages.  Past medical history, past surgical history, family history and social history were all reviewed and documented in the EPIC chart. 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 One daughter and grandson is 5.  ROS:  A ROS was performed and pertinent positives and negatives are included.  Exam:  Filed Vitals:   10/27/14 1555  BP: 136/80    General appearance:  Normal Thyroid:  Symmetrical, normal in size, without palpable masses or nodularity. Respiratory  Auscultation:  Clear without wheezing or rhonchi Cardiovascular  Auscultation:  Regular rate, without rubs, murmurs or gallops  Edema/varicosities:  Not grossly evident Abdominal  Soft,nontender, without masses, guarding or rebound.  Liver/spleen:  No organomegaly noted  Hernia:  None appreciated  Skin  Inspection:  Grossly normal   Breasts: Examined lying and sitting.     Right: Without masses, retractions, discharge or axillary adenopathy.     Left: Without masses, retractions, discharge or axillary adenopathy. Gentitourinary   Inguinal/mons:  Normal without inguinal adenopathy  External genitalia:  Normal  BUS/Urethra/Skene's glands:  Normal  Vagina:  Normal  Cervix:  Normal  Uterus:  normal in size, shape and contour.  Midline and mobile  Adnexa/parametria:     Rt: Without masses or tenderness.   Lt: Without masses or tenderness.  Anus and perineum: Normal  Digital rectal exam: Normal sphincter tone without palpated masses or tenderness  Assessment/Plan:  59 y.o. M WF G1 P1  for annual exam with no complaints other than weight..    Morbid  obesity Postmenopausal/no HRT/no bleeding 2006 LEEP for CIN-1 normal Paps after. Hypertension/hypercholesterolemia/depression-primary care manages labs and meds  Plan: Reviewed importance of decreasing calories and increasing exercise for weight loss. Brief discussion on gastric sleeve. SBE's, annual screening mammogram overdue instructed to schedule, 3-D tomography encouraged history of dense breasts. Vitamin D 1000 daily encouraged. Normal DEXA 2014, will repeat in 2 years. Home safety, fall prevention and importance of regular exercise reviewed. UA, Pap with HR HPV typing, new screening guidelines reviewed. If normal will repeat in 3 years. Harrington ChallengerYOUNG,NANCY J Watertown Regional Medical CtrWHNP, 4:48 PM 10/27/2014

## 2014-10-27 NOTE — Patient Instructions (Signed)
Health Recommendations for Postmenopausal Women Respected and ongoing research has looked at the most common causes of death, disability, and poor quality of life in postmenopausal women. The causes include heart disease, diseases of blood vessels, diabetes, depression, cancer, and bone loss (osteoporosis). Many things can be done to help lower the chances of developing these and other common problems. CARDIOVASCULAR DISEASE Heart Disease: A heart attack is a medical emergency. Know the signs and symptoms of a heart attack. Below are things women can do to reduce their risk for heart disease.   Do not smoke. If you smoke, quit.  Aim for a healthy weight. Being overweight causes many preventable deaths. Eat a healthy and balanced diet and drink an adequate amount of liquids.  Get moving. Make a commitment to be more physically active. Aim for 30 minutes of activity on most, if not all days of the week.  Eat for heart health. Choose a diet that is low in saturated fat and cholesterol and eliminate trans fat. Include whole grains, vegetables, and fruits. Read and understand the labels on food containers before buying.  Know your numbers. Ask your caregiver to check your blood pressure, cholesterol (total, HDL, LDL, triglycerides) and blood glucose. Work with your caregiver on improving your entire clinical picture.  High blood pressure. Limit or stop your table salt intake (try salt substitute and food seasonings). Avoid salty foods and drinks. Read labels on food containers before buying. Eating well and exercising can help control high blood pressure. STROKE  Stroke is a medical emergency. Stroke may be the result of a blood clot in a blood vessel in the brain or by a brain hemorrhage (bleeding). Know the signs and symptoms of a stroke. To lower the risk of developing a stroke:  Avoid fatty foods.  Quit smoking.  Control your diabetes, blood pressure, and irregular heart rate. THROMBOPHLEBITIS  (BLOOD CLOT) OF THE LEG  Becoming overweight and leading a stationary lifestyle may also contribute to developing blood clots. Controlling your diet and exercising will help lower the risk of developing blood clots. CANCER SCREENING  Breast Cancer: Take steps to reduce your risk of breast cancer.  You should practice "breast self-awareness." This means understanding the normal appearance and feel of your breasts and should include breast self-examination. Any changes detected, no matter how small, should be reported to your caregiver.  After age 4, you should have a clinical breast exam (CBE) every year.  Starting at age 48, you should consider having a mammogram (breast X-ray) every year.  If you have a family history of breast cancer, talk to your caregiver about genetic screening.  If you are at high risk for breast cancer, talk to your caregiver about having an MRI and a mammogram every year.  Intestinal or Stomach Cancer: Tests to consider are a rectal exam, fecal occult blood, sigmoidoscopy, and colonoscopy. Women who are high risk may need to be screened at an earlier age and more often.  Cervical Cancer:  Beginning at age 72, you should have a Pap test every 3 years as long as the past 3 Pap tests have been normal.  If you have had past treatment for cervical cancer or a condition that could lead to cancer, you need Pap tests and screening for cancer for at least 20 years after your treatment.  If you had a hysterectomy for a problem that was not cancer or a condition that could lead to cancer, then you no longer need Pap tests.  If you are between ages 65 and 70, and you have had normal Pap tests going back 10 years, you no longer need Pap tests.  If Pap tests have been discontinued, risk factors (such as a new sexual partner) need to be reassessed to determine if screening should be resumed.  Some medical problems can increase the chance of getting cervical cancer. In these  cases, your caregiver may recommend more frequent screening and Pap tests.  Uterine Cancer: If you have vaginal bleeding after reaching menopause, you should notify your caregiver.  Ovarian Cancer: Other than yearly pelvic exams, there are no reliable tests available to screen for ovarian cancer at this time except for yearly pelvic exams.  Lung Cancer: Yearly chest X-rays can detect lung cancer and should be done on high risk women, such as cigarette smokers and women with chronic lung disease (emphysema).  Skin Cancer: A complete body skin exam should be done at your yearly examination. Avoid overexposure to the sun and ultraviolet light lamps. Use a strong sun block cream when in the sun. All of these things are important for lowering the risk of skin cancer. MENOPAUSE Menopause Symptoms: Hormone therapy products are effective for treating symptoms associated with menopause:  Moderate to severe hot flashes.  Night sweats.  Mood swings.  Headaches.  Tiredness.  Loss of sex drive.  Insomnia.  Other symptoms. Hormone replacement carries certain risks, especially in older women. Women who use or are thinking about using estrogen or estrogen with progestin treatments should discuss that with their caregiver. Your caregiver will help you understand the benefits and risks. The ideal dose of hormone replacement therapy is not known. The Food and Drug Administration (FDA) has concluded that hormone therapy should be used only at the lowest doses and for the shortest amount of time to reach treatment goals.  OSTEOPOROSIS Protecting Against Bone Loss and Preventing Fracture If you use hormone therapy for prevention of bone loss (osteoporosis), the risks for bone loss must outweigh the risk of the therapy. Ask your caregiver about other medications known to be safe and effective for preventing bone loss and fractures. To guard against bone loss or fractures, the following is recommended:  If  you are younger than age 50, take 1000 mg of calcium and at least 600 mg of Vitamin D per day.  If you are older than age 50 but younger than age 70, take 1200 mg of calcium and at least 600 mg of Vitamin D per day.  If you are older than age 70, take 1200 mg of calcium and at least 800 mg of Vitamin D per day. Smoking and excessive alcohol intake increases the risk of osteoporosis. Eat foods rich in calcium and vitamin D and do weight bearing exercises several times a week as your caregiver suggests. DIABETES Diabetes Mellitus: If you have type I or type 2 diabetes, you should keep your blood sugar under control with diet, exercise, and recommended medication. Avoid starchy and fatty foods, and too many sweets. Being overweight can make diabetes control more difficult. COGNITION AND MEMORY Cognition and Memory: Menopausal hormone therapy is not recommended for the prevention of cognitive disorders such as Alzheimer's disease or memory loss.  DEPRESSION  Depression may occur at any age, but it is common in elderly women. This may be because of physical, medical, social (loneliness), or financial problems and needs. If you are experiencing depression because of medical problems and control of symptoms, talk to your caregiver about this. Physical   activity and exercise may help with mood and sleep. Community and volunteer involvement may improve your sense of value and worth. If you have depression and you feel that the problem is getting worse or becoming severe, talk to your caregiver about which treatment options are best for you. ACCIDENTS  Accidents are common and can be serious in elderly woman. Prepare your house to prevent accidents. Eliminate throw rugs, place hand bars in bath, shower, and toilet areas. Avoid wearing high heeled shoes or walking on wet, snowy, and icy areas. Limit or stop driving if you have vision or hearing problems, or if you feel you are unsteady with your movements and  reflexes. HEPATITIS C Hepatitis C is a type of viral infection affecting the liver. It is spread mainly through contact with blood from an infected person. It can be treated, but if left untreated, it can lead to severe liver damage over the years. Many people who are infected do not know that the virus is in their blood. If you are a "baby-boomer", it is recommended that you have one screening test for Hepatitis C. IMMUNIZATIONS  Several immunizations are important to consider having during your senior years, including:   Tetanus, diphtheria, and pertussis booster shot.  Influenza every year before the flu season begins.  Pneumonia vaccine.  Shingles vaccine.  Others, as indicated based on your specific needs. Talk to your caregiver about these. Document Released: 09/27/2005 Document Revised: 12/20/2013 Document Reviewed: 05/23/2008 Exit[employer] Patient Information 2015 [employer]. This information is not intended to replace advice given to you by your health care provider. Make sure you discuss any questions you have with your health care provider. Exercise to Stay Healthy Exercise helps you become and stay healthy. EXERCISE IDEAS AND TIPS Choose exercises that:  You enjoy.  Fit into your day. You do not need to exercise really hard to be healthy. You can do exercises at a slow or medium level and stay healthy. You can:  Stretch before and after working out.  Try yoga, Pilates, or tai chi.  Lift weights.  Walk fast, swim, jog, run, climb stairs, bicycle, dance, or rollerskate.  Take aerobic classes. Exercises that burn about 150 calories:  Running 1  miles in 15 minutes.  Playing volleyball for 45 to 60 minutes.  Washing and waxing a car for 45 to 60 minutes.  Playing touch football for 45 minutes.  Walking 1  miles in 35 minutes.  Pushing a stroller 1  miles in 30 minutes.  Playing basketball for 30 minutes.  Raking leaves for 30 minutes.  Bicycling 5  miles in 30 minutes.  Walking 2 miles in 30 minutes.  Dancing for 30 minutes.  Shoveling snow for 15 minutes.  Swimming laps for 20 minutes.  Walking up stairs for 15 minutes.  Bicycling 4 miles in 15 minutes.  Gardening for 30 to 45 minutes.  Jumping rope for 15 minutes.  Washing windows or floors for 45 to 60 minutes. Document Released: 09/07/2010 Document Revised: 10/28/2011 Document Reviewed: 09/07/2010 [employer] Patient Information 2015 [employer]. This information is not intended to replace advice given to you by your health care provider. Make sure you discuss any questions you have with your health care provider.  

## 2014-10-29 LAB — URINALYSIS W MICROSCOPIC + REFLEX CULTURE
Bilirubin Urine: NEGATIVE
CASTS: NONE SEEN
Glucose, UA: NEGATIVE mg/dL
Hgb urine dipstick: NEGATIVE
KETONES UR: NEGATIVE mg/dL
LEUKOCYTES UA: NEGATIVE
NITRITE: NEGATIVE
PH: 6.5 (ref 5.0–8.0)
PROTEIN: NEGATIVE mg/dL
SPECIFIC GRAVITY, URINE: 1.025 (ref 1.005–1.030)
Urobilinogen, UA: 1 mg/dL (ref 0.0–1.0)

## 2014-10-31 LAB — CYTOLOGY - PAP

## 2014-11-11 ENCOUNTER — Encounter: Payer: Self-pay | Admitting: Women's Health

## 2015-03-17 ENCOUNTER — Encounter: Payer: Self-pay | Admitting: Women's Health

## 2016-04-04 ENCOUNTER — Encounter: Payer: Managed Care, Other (non HMO) | Admitting: Women's Health

## 2016-04-18 ENCOUNTER — Ambulatory Visit (INDEPENDENT_AMBULATORY_CARE_PROVIDER_SITE_OTHER): Payer: 59 | Admitting: Women's Health

## 2016-04-18 ENCOUNTER — Encounter: Payer: Self-pay | Admitting: Women's Health

## 2016-04-18 VITALS — BP 129/82 | Ht 66.0 in | Wt 293.2 lb

## 2016-04-18 DIAGNOSIS — Z01419 Encounter for gynecological examination (general) (routine) without abnormal findings: Secondary | ICD-10-CM

## 2016-04-18 DIAGNOSIS — I10 Essential (primary) hypertension: Secondary | ICD-10-CM

## 2016-04-18 NOTE — Patient Instructions (Signed)

## 2016-04-18 NOTE — Progress Notes (Signed)
Governor SpeckingRebecca G Rodman 11/01/1955 [id number]    History:    Presents for annual exam.  Postmenopausal on no HRT with no bleeding. Continues to have hot flushes. Hypertension, hypercholesteremia, primary care manages labs and meds. 2006 LEEP for persistent CIN-1 with normal Paps after. Normal mammogram history. 2014 normal DEXA hip average +0.7. Mother died of colon cancer at age 59 with metastasis. Negative colonoscopy 2008 was to have a 5 year follow-up.  Past medical history, past surgical history, family history and social history were all reviewed and documented in the EPIC chart. 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 Has one daughter age 59 and 59-year-old grandson.  ROS:  A ROS was performed and pertinent positives and negatives are included.  Exam:  Vitals:   04/18/16 1503  BP: 129/82  Weight: 293 lb 3.2 oz (133 kg)  Height: 5\' 6"  (1.676 m)   Body mass index is 47.32 kg/m.   General appearance:  Normal Thyroid:  Symmetrical, normal in size, without palpable masses or nodularity. Respiratory  Auscultation:  Clear without wheezing or rhonchi Cardiovascular  Auscultation:  Regular rate, without rubs, murmurs or gallops  Edema/varicosities:  Not grossly evident Abdominal  Soft,nontender, without masses, guarding or rebound.  Liver/spleen:  No organomegaly noted  Hernia:  None appreciated  Skin  Inspection:  Grossly normal   Breasts: Examined lying and sitting.     Right: Without masses, retractions, discharge or axillary adenopathy.     Left: Without masses, retractions, discharge or axillary adenopathy. Gentitourinary   Inguinal/mons:  Normal without inguinal adenopathy  External genitalia:  Normal  BUS/Urethra/Skene's glands:  Normal  Vagina:  Normal  Cervix:  Normal  Uterus:   normal in size, shape and contour.  Midline and mobile  Adnexa/parametria:     Rt: Without masses or tenderness.   Lt: Without masses or tenderness.  Anus and perineum: Normal  Digital rectal  exam: Normal sphincter tone without palpated masses or tenderness  Assessment/Plan:  60 y.o. MWF G1 P1 for annual exam with no complaints.  Postmenopausal/no HRT/no bleeding Hot flushes 2006 LEEP for persistent CIN-1 Hypertension/hypercholesterolemia-primary care manages labs and meds Obesity  Plan: Options for hot flushes reviewed will try vitamin E twice daily. SBE's, continue annual screening mammogram, instructed to schedule. Exercise, calcium rich diet, vitamin D 1000 daily encouraged. Reviewed importance of decreasing calories and increasing exercise for weight loss. Home safety, fall prevention and importance of weightbearing exercise reviewed. Instructed to schedule screening colonoscopy. Repeat DEXA next year. UA, Pap with HR HPV typing, new screening guidelines reviewed.   Harrington ChallengerYOUNG,NANCY J Valley Regional Medical CenterWHNP, 3:42 PM 04/18/2016

## 2016-04-19 LAB — PAP, TP IMAGING W/ HPV RNA, RFLX HPV TYPE 16,18/45: HPV MRNA, HIGH RISK: NOT DETECTED

## 2016-06-22 ENCOUNTER — Encounter: Payer: Self-pay | Admitting: Women's Health

## 2017-08-13 DIAGNOSIS — M76821 Posterior tibial tendinitis, right leg: Secondary | ICD-10-CM | POA: Diagnosis not present

## 2017-08-13 DIAGNOSIS — M76822 Posterior tibial tendinitis, left leg: Secondary | ICD-10-CM | POA: Diagnosis not present

## 2017-08-13 DIAGNOSIS — M722 Plantar fascial fibromatosis: Secondary | ICD-10-CM | POA: Diagnosis not present

## 2017-08-18 DIAGNOSIS — R7301 Impaired fasting glucose: Secondary | ICD-10-CM | POA: Diagnosis not present

## 2017-08-18 DIAGNOSIS — E782 Mixed hyperlipidemia: Secondary | ICD-10-CM | POA: Diagnosis not present

## 2017-08-20 DIAGNOSIS — M76829 Posterior tibial tendinitis, unspecified leg: Secondary | ICD-10-CM | POA: Diagnosis not present

## 2017-08-20 DIAGNOSIS — M722 Plantar fascial fibromatosis: Secondary | ICD-10-CM | POA: Diagnosis not present

## 2017-08-20 DIAGNOSIS — M71572 Other bursitis, not elsewhere classified, left ankle and foot: Secondary | ICD-10-CM | POA: Diagnosis not present

## 2017-08-28 DIAGNOSIS — M722 Plantar fascial fibromatosis: Secondary | ICD-10-CM | POA: Diagnosis not present

## 2017-08-28 DIAGNOSIS — M71571 Other bursitis, not elsewhere classified, right ankle and foot: Secondary | ICD-10-CM | POA: Diagnosis not present

## 2017-09-04 DIAGNOSIS — M71571 Other bursitis, not elsewhere classified, right ankle and foot: Secondary | ICD-10-CM | POA: Diagnosis not present

## 2017-09-04 DIAGNOSIS — M722 Plantar fascial fibromatosis: Secondary | ICD-10-CM | POA: Diagnosis not present

## 2017-09-16 DIAGNOSIS — M71571 Other bursitis, not elsewhere classified, right ankle and foot: Secondary | ICD-10-CM | POA: Diagnosis not present

## 2017-09-16 DIAGNOSIS — R51 Headache: Secondary | ICD-10-CM | POA: Diagnosis not present

## 2017-09-16 DIAGNOSIS — M722 Plantar fascial fibromatosis: Secondary | ICD-10-CM | POA: Diagnosis not present

## 2017-09-20 DIAGNOSIS — M79601 Pain in right arm: Secondary | ICD-10-CM | POA: Diagnosis not present

## 2017-09-20 DIAGNOSIS — M7521 Bicipital tendinitis, right shoulder: Secondary | ICD-10-CM | POA: Diagnosis not present

## 2017-09-22 DIAGNOSIS — Z1231 Encounter for screening mammogram for malignant neoplasm of breast: Secondary | ICD-10-CM | POA: Diagnosis not present

## 2017-10-06 DIAGNOSIS — R21 Rash and other nonspecific skin eruption: Secondary | ICD-10-CM | POA: Diagnosis not present

## 2017-10-28 ENCOUNTER — Encounter: Payer: Self-pay | Admitting: Women's Health

## 2017-10-28 ENCOUNTER — Ambulatory Visit: Payer: 59 | Admitting: Women's Health

## 2017-10-28 VITALS — BP 128/80 | Ht 66.0 in | Wt 287.0 lb

## 2017-10-28 DIAGNOSIS — Z13820 Encounter for screening for osteoporosis: Secondary | ICD-10-CM

## 2017-10-28 DIAGNOSIS — N898 Other specified noninflammatory disorders of vagina: Secondary | ICD-10-CM

## 2017-10-28 DIAGNOSIS — Z01419 Encounter for gynecological examination (general) (routine) without abnormal findings: Secondary | ICD-10-CM

## 2017-10-28 LAB — WET PREP FOR TRICH, YEAST, CLUE

## 2017-10-28 NOTE — Patient Instructions (Signed)
Health Maintenance for Postmenopausal Women Menopause is a normal process in which your reproductive ability comes to an end. This process happens gradually over a span of months to years, usually between the ages of 22 and 9. Menopause is complete when you have missed 12 consecutive menstrual periods. It is important to talk with your health care provider about some of the most common conditions that affect postmenopausal women, such as heart disease, cancer, and bone loss (osteoporosis). Adopting a healthy lifestyle and getting preventive care can help to promote your health and wellness. Those actions can also lower your chances of developing some of these common conditions. What should I know about menopause? During menopause, you may experience a number of symptoms, such as:  Moderate-to-severe hot flashes.  Night sweats.  Decrease in sex drive.  Mood swings.  Headaches.  Tiredness.  Irritability.  Memory problems.  Insomnia.  Choosing to treat or not to treat menopausal changes is an individual decision that you make with your health care provider. What should I know about hormone replacement therapy and supplements? Hormone therapy products are effective for treating symptoms that are associated with menopause, such as hot flashes and night sweats. Hormone replacement carries certain risks, especially as you become older. If you are thinking about using estrogen or estrogen with progestin treatments, discuss the benefits and risks with your health care provider. What should I know about heart disease and stroke? Heart disease, heart attack, and stroke become more likely as you age. This may be due, in part, to the hormonal changes that your body experiences during menopause. These can affect how your body processes dietary fats, triglycerides, and cholesterol. Heart attack and stroke are both medical emergencies. There are many things that you can do to help prevent heart disease  and stroke:  Have your blood pressure checked at least every 1-2 years. High blood pressure causes heart disease and increases the risk of stroke.  If you are 53-22 years old, ask your health care provider if you should take aspirin to prevent a heart attack or a stroke.  Do not use any tobacco products, including cigarettes, chewing tobacco, or electronic cigarettes. If you need help quitting, ask your health care provider.  It is important to eat a healthy diet and maintain a healthy weight. ? Be sure to include plenty of vegetables, fruits, low-fat dairy products, and lean protein. ? Avoid eating foods that are high in solid fats, added sugars, or salt (sodium).  Get regular exercise. This is one of the most important things that you can do for your health. ? Try to exercise for at least 150 minutes each week. The type of exercise that you do should increase your heart rate and make you sweat. This is known as moderate-intensity exercise. ? Try to do strengthening exercises at least twice each week. Do these in addition to the moderate-intensity exercise.  Know your numbers.Ask your health care provider to check your cholesterol and your blood glucose. Continue to have your blood tested as directed by your health care provider.  What should I know about cancer screening? There are several types of cancer. Take the following steps to reduce your risk and to catch any cancer development as early as possible. Breast Cancer  Practice breast self-awareness. ? This means understanding how your breasts normally appear and feel. ? It also means doing regular breast self-exams. Let your health care provider know about any changes, no matter how small.  If you are 40  or older, have a clinician do a breast exam (clinical breast exam or CBE) every year. Depending on your age, family history, and medical history, it may be recommended that you also have a yearly breast X-ray (mammogram).  If you  have a family history of breast cancer, talk with your health care provider about genetic screening.  If you are at high risk for breast cancer, talk with your health care provider about having an MRI and a mammogram every year.  Breast cancer (BRCA) gene test is recommended for women who have family members with BRCA-related cancers. Results of the assessment will determine the need for genetic counseling and BRCA1 and for BRCA2 testing. BRCA-related cancers include these types: ? Breast. This occurs in males or females. ? Ovarian. ? Tubal. This may also be called fallopian tube cancer. ? Cancer of the abdominal or pelvic lining (peritoneal cancer). ? Prostate. ? Pancreatic.  Cervical, Uterine, and Ovarian Cancer Your health care provider may recommend that you be screened regularly for cancer of the pelvic organs. These include your ovaries, uterus, and vagina. This screening involves a pelvic exam, which includes checking for microscopic changes to the surface of your cervix (Pap test).  For women ages 21-65, health care providers may recommend a pelvic exam and a Pap test every three years. For women ages 79-65, they may recommend the Pap test and pelvic exam, combined with testing for human papilloma virus (HPV), every five years. Some types of HPV increase your risk of cervical cancer. Testing for HPV may also be done on women of any age who have unclear Pap test results.  Other health care providers may not recommend any screening for nonpregnant women who are considered low risk for pelvic cancer and have no symptoms. Ask your health care provider if a screening pelvic exam is right for you.  If you have had past treatment for cervical cancer or a condition that could lead to cancer, you need Pap tests and screening for cancer for at least 20 years after your treatment. If Pap tests have been discontinued for you, your risk factors (such as having a new sexual partner) need to be  reassessed to determine if you should start having screenings again. Some women have medical problems that increase the chance of getting cervical cancer. In these cases, your health care provider may recommend that you have screening and Pap tests more often.  If you have a family history of uterine cancer or ovarian cancer, talk with your health care provider about genetic screening.  If you have vaginal bleeding after reaching menopause, tell your health care provider.  There are currently no reliable tests available to screen for ovarian cancer.  Lung Cancer Lung cancer screening is recommended for adults 69-62 years old who are at high risk for lung cancer because of a history of smoking. A yearly low-dose CT scan of the lungs is recommended if you:  Currently smoke.  Have a history of at least 30 pack-years of smoking and you currently smoke or have quit within the past 15 years. A pack-year is smoking an average of one pack of cigarettes per day for one year.  Yearly screening should:  Continue until it has been 15 years since you quit.  Stop if you develop a health problem that would prevent you from having lung cancer treatment.  Colorectal Cancer  This type of cancer can be detected and can often be prevented.  Routine colorectal cancer screening usually begins at  age 42 and continues through age 45.  If you have risk factors for colon cancer, your health care provider may recommend that you be screened at an earlier age.  If you have a family history of colorectal cancer, talk with your health care provider about genetic screening.  Your health care provider may also recommend using home test kits to check for hidden blood in your stool.  A small camera at the end of a tube can be used to examine your colon directly (sigmoidoscopy or colonoscopy). This is done to check for the earliest forms of colorectal cancer.  Direct examination of the colon should be repeated every  5-10 years until age 71. However, if early forms of precancerous polyps or small growths are found or if you have a family history or genetic risk for colorectal cancer, you may need to be screened more often.  Skin Cancer  Check your skin from head to toe regularly.  Monitor any moles. Be sure to tell your health care provider: ? About any new moles or changes in moles, especially if there is a change in a mole's shape or color. ? If you have a mole that is larger than the size of a pencil eraser.  If any of your family members has a history of skin cancer, especially at a young age, talk with your health care provider about genetic screening.  Always use sunscreen. Apply sunscreen liberally and repeatedly throughout the day.  Whenever you are outside, protect yourself by wearing long sleeves, pants, a wide-brimmed hat, and sunglasses.  What should I know about osteoporosis? Osteoporosis is a condition in which bone destruction happens more quickly than new bone creation. After menopause, you may be at an increased risk for osteoporosis. To help prevent osteoporosis or the bone fractures that can happen because of osteoporosis, the following is recommended:  If you are 46-71 years old, get at least 1,000 mg of calcium and at least 600 mg of vitamin D per day.  If you are older than age 55 but younger than age 65, get at least 1,200 mg of calcium and at least 600 mg of vitamin D per day.  If you are older than age 54, get at least 1,200 mg of calcium and at least 800 mg of vitamin D per day.  Smoking and excessive alcohol intake increase the risk of osteoporosis. Eat foods that are rich in calcium and vitamin D, and do weight-bearing exercises several times each week as directed by your health care provider. What should I know about how menopause affects my mental health? Depression may occur at any age, but it is more common as you become older. Common symptoms of depression  include:  Low or sad mood.  Changes in sleep patterns.  Changes in appetite or eating patterns.  Feeling an overall lack of motivation or enjoyment of activities that you previously enjoyed.  Frequent crying spells.  Talk with your health care provider if you think that you are experiencing depression. What should I know about immunizations? It is important that you get and maintain your immunizations. These include:  Tetanus, diphtheria, and pertussis (Tdap) booster vaccine.  Influenza every year before the flu season begins.  Pneumonia vaccine.  Shingles vaccine.  Your health care provider may also recommend other immunizations. This information is not intended to replace advice given to you by your health care provider. Make sure you discuss any questions you have with your health care provider. Document Released: 09/27/2005  Document Revised: 02/23/2016 Document Reviewed: 05/09/2015 Elsevier Interactive Patient Education  2018 Else[employer] Inc. Carbohydrate Counting for Diabetes Mellitus, Adult Carbohydrate counting is a method for keeping track of how many carbohydrates you eat. Eating carbohydrates naturally increases the amount of sugar (glucose) in the blood. Counting how many carbohydrates you eat helps keep your blood glucose within normal limits, which helps you manage your diabetes (diabetes mellitus). It is important to know how many carbohydrates you can safely have in each meal. This is different for every person. A diet and nutrition specialist (registered dietitian) can help you make a meal plan and calculate how many carbohydrates you should have at each meal and snack. Carbohydrates are found in the following foods:  Grains, such as breads and cereals.  Dried beans and soy products.  Starchy vegetables, such as potatoes, peas, and corn.  Fruit and fruit juices.  Milk and yogurt.  Sweets and snack foods, such as cake, cookies, candy, chips, and soft  drinks.  How do I count carbohydrates? There are two ways to count carbohydrates in food. You can use either of the methods or a combination of both. Reading "Nutrition Facts" on packaged food The "Nutrition Facts" list is included on the labels of almost all packaged foods and beverages in the [place] It includes:  The serving size.  Information about nutrients in each serving, including the grams (g) of carbohydrate per serving.  To use the "Nutrition Facts":  Decide how many servings you will have.  Multiply the number of servings by the number of carbohydrates per serving.  The resulting number is the total amount of carbohydrates that you will be having.  Learning standard serving sizes of other foods When you eat foods containing carbohydrates that are not packaged or do not include "Nutrition Facts" on the label, you need to measure the servings in order to count the amount of carbohydrates:  Measure the foods that you will eat with a food scale or measuring cup, if needed.  Decide how many standard-size servings you will eat.  Multiply the number of servings by 15. Most carbohydrate-rich foods have about 15 g of carbohydrates per serving. ? For example, if you eat 8 oz (170 g) of strawberries, you will have eaten 2 servings and 30 g of carbohydrates (2 servings x 15 g = 30 g).  For foods that have more than one food mixed, such as soups and casseroles, you must count the carbohydrates in each food that is included.  The following list contains standard serving sizes of common carbohydrate-rich foods. Each of these servings has about 15 g of carbohydrates:   hamburger bun or  English muffin.   oz (15 mL) syrup.   oz (14 g) jelly.  1 slice of bread.  1 six-inch tortilla.  3 oz (85 g) cooked rice or pasta.  4 oz (113 g) cooked dried beans.  4 oz (113 g) starchy vegetable, such as peas, corn, or potatoes.  4 oz (113 g) hot cereal.  4 oz (113 g) mashed potatoes  or  of a large baked potato.  4 oz (113 g) canned or frozen fruit.  4 oz (120 mL) fruit juice.  4-6 crackers.  6 chicken nuggets.  6 oz (170 g) unsweetened dry cereal.  6 oz (170 g) plain fat-free yogurt or yogurt sweetened with artificial sweeteners.  8 oz (240 mL) milk.  8 oz (170 g) fresh fruit or one small piece of fruit.  24 oz (680 g) popped   popcorn.  Example of carbohydrate counting Sample meal  3 oz (85 g) chicken breast.  6 oz (170 g) brown rice.  4 oz (113 g) corn.  8 oz (240 mL) milk.  8 oz (170 g) strawberries with sugar-free whipped topping. Carbohydrate calculation 1. Identify the foods that contain carbohydrates: ? Rice. ? Corn. ? Milk. ? Strawberries. 2. Calculate how many servings you have of each food: ? 2 servings rice. ? 1 serving corn. ? 1 serving milk. ? 1 serving strawberries. 3. Multiply each number of servings by 15 g: ? 2 servings rice x 15 g = 30 g. ? 1 serving corn x 15 g = 15 g. ? 1 serving milk x 15 g = 15 g. ? 1 serving strawberries x 15 g = 15 g. 4. Add together all of the amounts to find the total grams of carbohydrates eaten: ? 30 g + 15 g + 15 g + 15 g = 75 g of carbohydrates total. This information is not intended to replace advice given to you by your health care provider. Make sure you discuss any questions you have with your health care provider. Document Released: 08/05/2005 Document Revised: 02/23/2016 Document Reviewed: 01/17/2016 Elsevier Interactive Patient Education  2018 Elsevier Inc.  

## 2017-10-28 NOTE — Progress Notes (Signed)
[name] 12/24/1955 [id number]    History:    Presents for annual exam. Post menopausa on no HRT 2006 CIN1 with normal Paps after. Normal mammogram history. 2018 negative colonoscopy, mother died from colon cancer at age 61. 172014 T score +0.1.Primary care manages hypertension, hypercholesterolemia and anxiety.  Past medical history, past surgical history, family history and social history were all reviewed and documented in the EPIC chart. Office work for [employer].One daughter and 768-year-old grandson both doing well.  ROS:  A ROS was performed and pertinent positives and negatives are included.  Exam:  Vitals:   10/28/17 1439  BP: 128/80  Weight: 287 lb (130.2 kg)  Height: 5\' 6"  (1.676 m)   Body mass index is 46.32 kg/m.   General appearance:  Normal Thyroid:  Symmetrical, normal in size, without palpable masses or nodularity. Respiratory  Auscultation:  Clear without wheezing or rhonchi Cardiovascular  Auscultation:  Regular rate, without rubs, murmurs or gallops  Edema/varicosities:  Not grossly evident Abdominal  Soft,nontender, without masses, guarding or rebound.  Liver/spleen:  No organomegaly noted  Hernia:  None appreciated  Skin  Inspection:  Grossly normal   Breasts: Examined lying and sitting.     Right: Without masses, retractions, discharge or axillary adenopathy.     Left: Without masses, retractions, discharge or axillary adenopathy. Gentitourinary   Inguinal/mons:  Normal without inguinal adenopathy  External genitalia:  Normal  BUS/Urethra/Skene's glands:  Normal  Vagina:  Normal  Cervix:  Normal  Uterus:  normal in size, shape and contour.  Midline and mobile  Adnexa/parametria:     Rt: Without masses or tenderness.   Lt: Without masses or tenderness.  Anus and perineum: Normal  Digital rectal exam: Normal sphincter tone without palpated masses or tenderness  Assessment/Plan:  62 y.o. M WF G1 P1 for annual exam with no  complaints..  Postmenopausal/no HRT/no bleeding Hypertension, hypercholesteremia,  anxiety-primary care manages labs and meds Morbid obesity 2006 CIN-1 LEEP normal Paps after.  Plan: SBE's, overdue for annual mammogram instructed to schedule, increase regular exercise and decrease calories for weight loss encouraged. Vitamin D 2000 daily. Repeat DEXA. Home safety fall and fall prevention discussed. Pap normal with negative HR HPV 2018, will repeat next year.    Harrington Challengerancy J Iram Astorino [facility] CenterWHNP, 3:28 PM 10/28/2017

## 2018-04-21 DIAGNOSIS — M722 Plantar fascial fibromatosis: Secondary | ICD-10-CM | POA: Diagnosis not present

## 2018-08-13 DIAGNOSIS — R7301 Impaired fasting glucose: Secondary | ICD-10-CM | POA: Diagnosis not present

## 2018-08-13 DIAGNOSIS — Z86010 Personal history of colonic polyps: Secondary | ICD-10-CM | POA: Diagnosis not present

## 2018-08-13 DIAGNOSIS — I10 Essential (primary) hypertension: Secondary | ICD-10-CM | POA: Diagnosis not present

## 2018-08-13 DIAGNOSIS — Z23 Encounter for immunization: Secondary | ICD-10-CM | POA: Diagnosis not present

## 2019-03-29 ENCOUNTER — Other Ambulatory Visit: Payer: Self-pay | Admitting: Family Medicine

## 2019-03-29 DIAGNOSIS — R1013 Epigastric pain: Secondary | ICD-10-CM

## 2019-03-30 ENCOUNTER — Ambulatory Visit
Admission: RE | Admit: 2019-03-30 | Discharge: 2019-03-30 | Disposition: A | Discharge status: Home / Self Care | Payer: 59 | Source: Ambulatory Visit | Attending: Family Medicine | Admitting: Family Medicine

## 2019-03-30 DIAGNOSIS — R1013 Epigastric pain: Secondary | ICD-10-CM

## 2019-08-11 ENCOUNTER — Encounter: Payer: Self-pay | Admitting: Women's Health

## 2019-08-16 ENCOUNTER — Other Ambulatory Visit: Payer: Self-pay

## 2019-08-17 ENCOUNTER — Encounter: Payer: Self-pay | Admitting: Women's Health

## 2019-08-17 ENCOUNTER — Ambulatory Visit (INDEPENDENT_AMBULATORY_CARE_PROVIDER_SITE_OTHER): Payer: 59 | Admitting: Women's Health

## 2019-08-17 ENCOUNTER — Telehealth: Payer: Self-pay

## 2019-08-17 VITALS — BP 124/83 | Ht 65.0 in | Wt 292.8 lb

## 2019-08-17 DIAGNOSIS — Z13820 Encounter for screening for osteoporosis: Secondary | ICD-10-CM | POA: Diagnosis not present

## 2019-08-17 DIAGNOSIS — Z01419 Encounter for gynecological examination (general) (routine) without abnormal findings: Secondary | ICD-10-CM

## 2019-08-17 MED ORDER — NYSTATIN 100000 UNIT/GM EX OINT: 1.0000 "application " | TOPICAL_OINTMENT | Freq: Two times a day (BID) | CUTANEOUS | 1 refills | Status: DC

## 2019-08-17 MED ORDER — NYSTATIN-TRIAMCINOLONE 100000-0.1 UNIT/GM-% EX OINT: 1.0000 "application " | TOPICAL_OINTMENT | Freq: Two times a day (BID) | CUTANEOUS | 6 refills | Status: DC

## 2019-08-17 MED ORDER — TRIAMCINOLONE ACETONIDE 0.1 % EX OINT: 1.0000 "application " | TOPICAL_OINTMENT | Freq: Two times a day (BID) | CUTANEOUS | 1 refills | Status: DC

## 2019-08-17 NOTE — Progress Notes (Signed)
RUWAYDA CURET [birth year] [id number]    History:    Presents for annual exam.  Postmenopausal on no HRT with no bleeding.  2006 CIN-1 with normal Paps after.  2018 - colonoscopy colonoscopy every 5 years.  2014 normal DEXA.  Normal mammograms annually at [facility].  Primary care manages hypertension, hypercholesteremia, anxiety/depression.  History of IBS.  Struggling with chronic knee pain has scheduled follow-up, was contemplating bariatric surgery insurance does not cover.  Past medical history, past surgical history, family history and social history were all reviewed and documented in the EPIC chart.  Works at a Aeronautical engineer job.  1 daughter and 1 grandson age 29.  Mother colon cancer at age 42.  ROS:  A ROS was performed and pertinent positives and negatives are included.  Exam:  Vitals:   08/17/19 0852  BP: 124/83  Weight: 292 lb 12.8 oz (132.8 kg)  Height: 5\' 5"  (1.651 m)   Body mass index is 48.72 kg/m.   General appearance:  Normal Thyroid:  Symmetrical, normal in size, without palpable masses or nodularity. Respiratory  Auscultation:  Clear without wheezing or rhonchi Cardiovascular  Auscultation:  Regular rate, without rubs, murmurs or gallops  Edema/varicosities:  Not grossly evident Abdominal  Soft,nontender, without masses, guarding or rebound.  Liver/spleen:  No organomegaly noted  Hernia:  None appreciated  Skin  Inspection:  Grossly normal erythemic at pannus   Breasts: Examined lying and sitting.     Right: Without masses, retractions, discharge or axillary adenopathy.     Left: Without masses, retractions, discharge or axillary adenopathy. Gentitourinary   Inguinal/mons:  Normal without inguinal adenopathy  External genitalia:  Normal  BUS/Urethra/Skene's glands:  Normal  Vagina:  Normal  Cervix:  Normal  Uterus: Difficult to palpate/girth nontender  Adnexa/parametria:     Rt: Without masses or tenderness.   Lt: Without masses or  tenderness.  Anus and perineum: Normal  Digital rectal exam: Normal sphincter tone without palpated masses or tenderness  Assessment/Plan:  63 y.o. MWF G1P1 for annual exam with no GYN complaints.  Postmenopausal/no HRT/no bleeding Chronic knee pain/follow-up scheduled Hypertension, hypercholesteremia, anxiety/depression-primary care manages labs and meds Erythema at pannus Obesity  Plan: Mycolog apply small amount twice daily to pannus, reviewed importance of keeping dry.  SBEs, continue annual 3D screening mammogram at [facility], instructed to have report sent to our office.  Aware of need to lose weight, encouraged low calorie/carb diet, weight watchers.  Repeat DEXA, water aerobics encouraged once [employer] open for classes due to knee pain.  Vitamin D 2000 IUs daily.  Self-care, leisure activities encouraged.  Pap normal with negative HR HPV 2017, new screening guidelines reviewed.   Huel [facility], 9:20 AM 08/17/2019

## 2019-08-17 NOTE — Patient Instructions (Addendum)
It was good to see you today Vitamin D 2000 IUs daily Schedule bone density Health Maintenance for Postmenopausal Women Menopause is a normal process in which your ability to get pregnant comes to an end. This process happens slowly over many months or years, usually between the ages of 59 and 69. Menopause is complete when you have missed your menstrual periods for 12 months. It is important to talk with your health care provider about some of the most common conditions that affect women after menopause (postmenopausal women). These include heart disease, cancer, and bone loss (osteoporosis). Adopting a healthy lifestyle and getting preventive care can help to promote your health and wellness. The actions you take can also lower your chances of developing some of these common conditions. What should I know about menopause? During menopause, you may get a number of symptoms, such as:  Hot flashes. These can be moderate or severe.  Night sweats.  Decrease in sex drive.  Mood swings.  Headaches.  Tiredness.  Irritability.  Memory problems.  Insomnia. Choosing to treat or not to treat these symptoms is a decision that you make with your health care provider. Do I need hormone replacement therapy?  Hormone replacement therapy is effective in treating symptoms that are caused by menopause, such as hot flashes and night sweats.  Hormone replacement carries certain risks, especially as you become older. If you are thinking about using estrogen or estrogen with progestin, discuss the benefits and risks with your health care provider. What is my risk for heart disease and stroke? The risk of heart disease, heart attack, and stroke increases as you age. One of the causes may be a change in the body's hormones during menopause. This can affect how your body uses dietary fats, triglycerides, and cholesterol. Heart attack and stroke are medical emergencies. There are many things that you can do  to help prevent heart disease and stroke. Watch your blood pressure  High blood pressure causes heart disease and increases the risk of stroke. This is more likely to develop in people who have high blood pressure readings, are of African descent, or are overweight.  Have your blood pressure checked: ? Every 3-5 years if you are 52-79 years of age. ? Every year if you are 14 years old or older. Eat a healthy diet   Eat a diet that includes plenty of vegetables, fruits, low-fat dairy products, and lean protein.  Do not eat a lot of foods that are high in solid fats, added sugars, or sodium. Get regular exercise Get regular exercise. This is one of the most important things you can do for your health. Most adults should:  Try to exercise for at least 150 minutes each week. The exercise should increase your heart rate and make you sweat (moderate-intensity exercise).  Try to do strengthening exercises at least twice each week. Do these in addition to the moderate-intensity exercise.  Spend less time sitting. Even light physical activity can be beneficial. Other tips  Work with your health care provider to achieve or maintain a healthy weight.  Do not use any products that contain nicotine or tobacco, such as cigarettes, e-cigarettes, and chewing tobacco. If you need help quitting, ask your health care provider.  Know your numbers. Ask your health care provider to check your cholesterol and your blood sugar (glucose). Continue to have your blood tested as directed by your health care provider. Do I need screening for cancer? Depending on your health history and family  history, you may need to have cancer screening at different stages of your life. This may include screening for:  Breast cancer.  Cervical cancer.  Lung cancer.  Colorectal cancer. What is my risk for osteoporosis? After menopause, you may be at increased risk for osteoporosis. Osteoporosis is a condition in which  bone destruction happens more quickly than new bone creation. To help prevent osteoporosis or the bone fractures that can happen because of osteoporosis, you may take the following actions:  If you are 68-25 years old, get at least 1,000 mg of calcium and at least 600 mg of vitamin D per day.  If you are older than age 33 but younger than age 30, get at least 1,200 mg of calcium and at least 600 mg of vitamin D per day.  If you are older than age 24, get at least 1,200 mg of calcium and at least 800 mg of vitamin D per day. Smoking and drinking excessive alcohol increase the risk of osteoporosis. Eat foods that are rich in calcium and vitamin D, and do weight-bearing exercises several times each week as directed by your health care provider. How does menopause affect my mental health? Depression may occur at any age, but it is more common as you become older. Common symptoms of depression include:  Low or sad mood.  Changes in sleep patterns.  Changes in appetite or eating patterns.  Feeling an overall lack of motivation or enjoyment of activities that you previously enjoyed.  Frequent crying spells. Talk with your health care provider if you think that you are experiencing depression. General instructions See your health care provider for regular wellness exams and vaccines. This may include:  Scheduling regular health, dental, and eye exams.  Getting and maintaining your vaccines. These include: ? Influenza vaccine. Get this vaccine each year before the flu season begins. ? Pneumonia vaccine. ? Shingles vaccine. ? Tetanus, diphtheria, and pertussis (Tdap) booster vaccine. Your health care provider may also recommend other immunizations. Tell your health care provider if you have ever been abused or do not feel safe at home. Summary  Menopause is a normal process in which your ability to get pregnant comes to an end.  This condition causes hot flashes, night sweats, decreased  interest in sex, mood swings, headaches, or lack of sleep.  Treatment for this condition may include hormone replacement therapy.  Take actions to keep yourself healthy, including exercising regularly, eating a healthy diet, watching your weight, and checking your blood pressure and blood sugar levels.  Get screened for cancer and depression. Make sure that you are up to date with all your vaccines. This information is not intended to replace advice given to you by your health care provider. Make sure you discuss any questions you have with your health care provider. Document Released: 09/27/2005 Document Revised: 07/29/2018 Document Reviewed: 07/29/2018 Elsevier Patient Education  2020 [employer]  Carbohydrate Counting for Diabetes Mellitus, Adult  Carbohydrate counting is a method of keeping track of how many carbohydrates you eat. Eating carbohydrates naturally increases the amount of sugar (glucose) in the blood. Counting how many carbohydrates you eat helps keep your blood glucose within normal limits, which helps you manage your diabetes (diabetes mellitus). It is important to know how many carbohydrates you can safely have in each meal. This is different for every person. A diet and nutrition specialist (registered dietitian) can help you make a meal plan and calculate how many carbohydrates you should have at each meal  and snack. Carbohydrates are found in the following foods:  Grains, such as breads and cereals.  Dried beans and soy products.  Starchy vegetables, such as potatoes, peas, and corn.  Fruit and fruit juices.  Milk and yogurt.  Sweets and snack foods, such as cake, cookies, candy, chips, and soft drinks. How do I count carbohydrates? There are two ways to count carbohydrates in food. You can use either of the methods or a combination of both. Reading "Nutrition Facts" on packaged food The "Nutrition Facts" list is included on the labels of almost all packaged  foods and beverages in the [place] It includes:  The serving size.  Information about nutrients in each serving, including the grams (g) of carbohydrate per serving. To use the "Nutrition Facts":  Decide how many servings you will have.  Multiply the number of servings by the number of carbohydrates per serving.  The resulting number is the total amount of carbohydrates that you will be having. Learning standard serving sizes of other foods When you eat carbohydrate foods that are not packaged or do not include "Nutrition Facts" on the label, you need to measure the servings in order to count the amount of carbohydrates:  Measure the foods that you will eat with a food scale or measuring cup, if needed.  Decide how many standard-size servings you will eat.  Multiply the number of servings by 15. Most carbohydrate-rich foods have about 15 g of carbohydrates per serving. ? For example, if you eat 8 oz (170 g) of strawberries, you will have eaten 2 servings and 30 g of carbohydrates (2 servings x 15 g = 30 g).  For foods that have more than one food mixed, such as soups and casseroles, you must count the carbohydrates in each food that is included. The following list contains standard serving sizes of common carbohydrate-rich foods. Each of these servings has about 15 g of carbohydrates:   hamburger bun or  English muffin.   oz (15 mL) syrup.   oz (14 g) jelly.  1 slice of bread.  1 six-inch tortilla.  3 oz (85 g) cooked rice or pasta.  4 oz (113 g) cooked dried beans.  4 oz (113 g) starchy vegetable, such as peas, corn, or potatoes.  4 oz (113 g) hot cereal.  4 oz (113 g) mashed potatoes or  of a large baked potato.  4 oz (113 g) canned or frozen fruit.  4 oz (120 mL) fruit juice.  4-6 crackers.  6 chicken nuggets.  6 oz (170 g) unsweetened dry cereal.  6 oz (170 g) plain fat-free yogurt or yogurt sweetened with artificial sweeteners.  8 oz (240 mL) milk.  8  oz (170 g) fresh fruit or one small piece of fruit.  24 oz (680 g) popped popcorn. Example of carbohydrate counting Sample meal  3 oz (85 g) chicken breast.  6 oz (170 g) brown rice.  4 oz (113 g) corn.  8 oz (240 mL) milk.  8 oz (170 g) strawberries with sugar-free whipped topping. Carbohydrate calculation 1. Identify the foods that contain carbohydrates: ? Rice. ? Corn. ? Milk. ? Strawberries. 2. Calculate how many servings you have of each food: ? 2 servings rice. ? 1 serving corn. ? 1 serving milk. ? 1 serving strawberries. 3. Multiply each number of servings by 15 g: ? 2 servings rice x 15 g = 30 g. ? 1 serving corn x 15 g = 15 g. ? 1 serving milk  x 15 g = 15 g. ? 1 serving strawberries x 15 g = 15 g. 4. Add together all of the amounts to find the total grams of carbohydrates eaten: ? 30 g + 15 g + 15 g + 15 g = 75 g of carbohydrates total. Summary  Carbohydrate counting is a method of keeping track of how many carbohydrates you eat.  Eating carbohydrates naturally increases the amount of sugar (glucose) in the blood.  Counting how many carbohydrates you eat helps keep your blood glucose within normal limits, which helps you manage your diabetes.  A diet and nutrition specialist (registered dietitian) can help you make a meal plan and calculate how many carbohydrates you should have at each meal and snack. This information is not intended to replace advice given to you by your health care provider. Make sure you discuss any questions you have with your health care provider. Document Released: 08/05/2005 Document Revised: 02/27/2017 Document Reviewed: 01/17/2016 Elsevier Patient Education  2020 [employer].

## 2019-08-17 NOTE — Telephone Encounter (Signed)
Generic Mycolog is non-formulary and very expensive for patient. Pharmacy wants to see if you will send two separate Rx's, one for Nystatin and one for Triamcinolone.  I am happy to send them for you if okay.

## 2019-08-17 NOTE — Telephone Encounter (Signed)
Original Rx discontinued. New Rx's sent.

## 2019-08-17 NOTE — Telephone Encounter (Signed)
Yes please do thank you with a refill

## 2019-08-17 NOTE — Addendum Note (Signed)
Addended by: Ramond Craver on: 08/17/2019 02:13 PM   Modules accepted: Orders

## 2019-09-08 ENCOUNTER — Ambulatory Visit (INDEPENDENT_AMBULATORY_CARE_PROVIDER_SITE_OTHER): Payer: 59 | Admitting: Family Medicine

## 2019-09-08 ENCOUNTER — Encounter (INDEPENDENT_AMBULATORY_CARE_PROVIDER_SITE_OTHER): Payer: Self-pay | Admitting: Family Medicine

## 2019-09-08 ENCOUNTER — Other Ambulatory Visit: Payer: Self-pay

## 2019-09-08 VITALS — BP 130/76 | HR 96 | Temp 98.2°F | Ht 66.0 in | Wt 290.0 lb

## 2019-09-08 DIAGNOSIS — F3289 Other specified depressive episodes: Secondary | ICD-10-CM | POA: Diagnosis not present

## 2019-09-08 DIAGNOSIS — Z6841 Body Mass Index (BMI) 40.0 and over, adult: Secondary | ICD-10-CM

## 2019-09-08 DIAGNOSIS — Z9189 Other specified personal risk factors, not elsewhere classified: Secondary | ICD-10-CM

## 2019-09-08 DIAGNOSIS — R7303 Prediabetes: Secondary | ICD-10-CM

## 2019-09-08 DIAGNOSIS — E7849 Other hyperlipidemia: Secondary | ICD-10-CM

## 2019-09-08 DIAGNOSIS — R5383 Other fatigue: Secondary | ICD-10-CM | POA: Diagnosis not present

## 2019-09-08 DIAGNOSIS — Z0289 Encounter for other administrative examinations: Secondary | ICD-10-CM

## 2019-09-08 DIAGNOSIS — I10 Essential (primary) hypertension: Secondary | ICD-10-CM | POA: Diagnosis not present

## 2019-09-08 DIAGNOSIS — R0602 Shortness of breath: Secondary | ICD-10-CM | POA: Diagnosis not present

## 2019-09-09 LAB — VITAMIN B12: Vitamin B-12: 1962 pg/mL — ABNORMAL HIGH (ref 232–1245)

## 2019-09-09 LAB — TSH: TSH: 1.57 u[IU]/mL (ref 0.450–4.500)

## 2019-09-09 LAB — COMPREHENSIVE METABOLIC PANEL
ALT: 51 IU/L — ABNORMAL HIGH (ref 0–32)
AST: 31 IU/L (ref 0–40)
Albumin/Globulin Ratio: 2.5 — ABNORMAL HIGH (ref 1.2–2.2)
Albumin: 4.8 g/dL (ref 3.8–4.8)
Alkaline Phosphatase: 167 IU/L — ABNORMAL HIGH (ref 39–117)
BUN/Creatinine Ratio: 18 (ref 12–28)
BUN: 16 mg/dL (ref 8–27)
Bilirubin Total: 0.5 mg/dL (ref 0.0–1.2)
CO2: 24 mmol/L (ref 20–29)
Calcium: 10.5 mg/dL — ABNORMAL HIGH (ref 8.7–10.3)
Chloride: 104 mmol/L (ref 96–106)
Creatinine, Ser: 0.89 mg/dL (ref 0.57–1.00)
GFR calc Af Amer: 80 mL/min/{1.73_m2} (ref >59)
GFR calc non Af Amer: 70 mL/min/{1.73_m2} (ref >59)
Globulin, Total: 1.9 g/dL (ref 1.5–4.5)
Glucose: 110 mg/dL — ABNORMAL HIGH (ref 65–99)
Potassium: 5 mmol/L (ref 3.5–5.2)
Sodium: 141 mmol/L (ref 134–144)
Total Protein: 6.7 g/dL (ref 6.0–8.5)

## 2019-09-09 LAB — VITAMIN D 25 HYDROXY (VIT D DEFICIENCY, FRACTURES): Vit D, 25-Hydroxy: 23.9 ng/mL — ABNORMAL LOW (ref 30.0–100.0)

## 2019-09-09 LAB — CBC WITH DIFFERENTIAL/PLATELET
Basophils Absolute: 0 10*3/uL (ref 0.0–0.2)
Basos: 0 %
EOS (ABSOLUTE): 0 10*3/uL (ref 0.0–0.4)
Eos: 0 %
Hematocrit: 43.8 % (ref 34.0–46.6)
Hemoglobin: 15.1 g/dL (ref 11.1–15.9)
Immature Grans (Abs): 0 10*3/uL (ref 0.0–0.1)
Immature Granulocytes: 0 %
Lymphocytes Absolute: 2.1 10*3/uL (ref 0.7–3.1)
Lymphs: 21 %
MCH: 30.1 pg (ref 26.6–33.0)
MCHC: 34.5 g/dL (ref 31.5–35.7)
MCV: 87 fL (ref 79–97)
Monocytes Absolute: 0.7 10*3/uL (ref 0.1–0.9)
Monocytes: 7 %
Neutrophils Absolute: 7.2 10*3/uL — ABNORMAL HIGH (ref 1.4–7.0)
Neutrophils: 72 %
Platelets: 320 10*3/uL (ref 150–450)
RBC: 5.01 x10E6/uL (ref 3.77–5.28)
RDW: 14.4 % (ref 11.7–15.4)
WBC: 10.1 10*3/uL (ref 3.4–10.8)

## 2019-09-09 LAB — LIPID PANEL WITH LDL/HDL RATIO
Cholesterol, Total: 153 mg/dL (ref 100–199)
HDL: 45 mg/dL (ref >39)
LDL Chol Calc (NIH): 89 mg/dL (ref 0–99)
LDL/HDL Ratio: 2 ratio (ref 0.0–3.2)
Triglycerides: 102 mg/dL (ref 0–149)
VLDL Cholesterol Cal: 19 mg/dL (ref 5–40)

## 2019-09-09 LAB — T3: T3, Total: 102 ng/dL (ref 71–180)

## 2019-09-09 LAB — HEMOGLOBIN A1C
Est. average glucose Bld gHb Est-mCnc: 120 mg/dL
Hgb A1c MFr Bld: 5.8 % — ABNORMAL HIGH (ref 4.8–5.6)

## 2019-09-09 LAB — INSULIN, RANDOM: INSULIN: 21.1 u[IU]/mL (ref 2.6–24.9)

## 2019-09-09 LAB — T4, FREE: Free T4: 1.27 ng/dL (ref 0.82–1.77)

## 2019-09-09 LAB — FOLATE: Folate: 13.3 ng/mL (ref >3.0)

## 2019-09-09 NOTE — Progress Notes (Addendum)
Chief Complaint:   OBESITY Tiffany Norton (MR# [id number]) is a 64 y.o. female who presents for evaluation and treatment of obesity and related comorbidities. Current BMI is Body mass index is 46.81 kg/m.Marland Kitchen Tiffany Norton has been struggling with her weight for many years and has been unsuccessful in either losing weight, maintaining weight loss, or reaching her healthy weight goal. Tiffany Norton found out about our clinic through [facility].  Tiffany Norton needs bilateral knee replacement. She has one packet of maple brown sugar oatmeal and Activa yogurt for breakfast (feels full). For lunch she has soups to go (yes! Sweet potato and Ginger). Between 2:00 and 3:00 PM she may have a snack of banana, apple or nabs. Tiffany Norton has [employer], and she eats half of a burrito (chicken). She eats chips and salsa.  Tiffany is currently in the action stage of change and ready to dedicate time achieving and maintaining a healthier weight. Tiffany Norton is interested in becoming our patient and working on intensive lifestyle modifications including (but not limited to) diet and exercise for weight loss.  Tiffany Norton's habits were reviewed today and are as follows: Her family eats meals together, she thinks her family will eat healthier with her, she struggles with family and or coworkers weight loss sabotage, her desired weight loss is 155 pounds, she has been heavy most of her life, she started gaining weight approximately 40 years ago, her heaviest weight ever was 295 pounds, she has significant food cravings issues, she skips meals frequently, she is frequently drinking liquids with calories, she frequently makes poor food choices, she has problems with excessive hunger, she frequently eats larger portions than normal, she has binge eating behaviors and she struggles with emotional eating.  Depression Screen Tiffany Norton Food and Mood (modified PHQ-9) score was strongly positive.  Depression screen PHQ 2/9 09/08/2019    Decreased Interest 3  Down, Depressed, Hopeless 2  PHQ - 2 Score 5  Altered sleeping 0  Tired, decreased energy 3  Change in appetite 3  Feeling bad or failure about yourself  2  Trouble concentrating 3  Moving slowly or fidgety/restless 0  Suicidal thoughts 1  PHQ-9 Score 17   Subjective:   Other fatigue - Plan: EKG 12-Lead, Vitamin B12, CBC with Differential/Platelet, Folate, T3, T4, free, TSH, VITAMIN D 25 Hydroxy (Vit-D Deficiency, Fractures) Tiffany Norton admits to daytime somnolence and denies waking up still tired. Patent has a history of symptoms of daytime fatigue, morning headache and hypertension. [name] generally gets 8 or 9 hours of sleep per night, and states that she has generally restful sleep. Snoring is present. Apneic episodes are not present. Epworth Sleepiness Score is 4. Labs, EKG and indirect calorimetry will be ordered today.  Shortness of breath on exertion Tiffany Norton notes increasing shortness of breath with exercising and seems to be worsening over time with weight gain. She notes getting out of breath sooner with activity than she used to. This has not gotten worse recently. Tiffany Norton denies shortness of breath at rest or orthopnea. Labs, EKG and indirect calorimetry will be ordered today.  Essential hypertension Tiffany Norton's blood pressure is controlled today. She has no chest pain, chest pressure or headache.  BP Readings from Last 3 Encounters:  09/08/19 130/76  08/17/19 124/83  10/28/17 128/80   Lab Results  Component Value Date   CREATININE 0.89 09/08/2019   Other hyperlipidemia - Plan: Lipid Panel With LDL/HDL Ratio Tiffany Norton has hyperlipidemia and she is on Pravastatin. She is attempting to improve her cholesterol  levels with intensive lifestyle modification including a low saturated fat diet, exercise and weight loss. She denies myalgias.  Lab Results  Component Value Date   ALT 51 (H) 09/08/2019   AST 31 09/08/2019   ALKPHOS 167 (H) 09/08/2019   BILITOT  0.5 09/08/2019   Lab Results  Component Value Date   CHOL 153 09/08/2019   HDL 45 09/08/2019   LDLCALC 89 09/08/2019   TRIG 102 09/08/2019   Prediabetes - Plan: Comprehensive metabolic panel, Hemoglobin A1c, Insulin, random Tiffany Norton has a diagnosis of prediabetes. Hgb A1c result is not in Epic. Patient states this is a diagnosis for many years. She was informed this puts her at greater risk of developing diabetes. She is attempting to work on diet and exercise to decrease her risk of diabetes. She denies nausea or hypoglycemia.  Lab Results  Component Value Date   HGBA1C 5.8 (H) 09/08/2019   Lab Results  Component Value Date   INSULIN WILL FOLLOW 09/08/2019   Other depression, with emotional eating (new) Tiffany Norton shows no sign of suicidal or homicidal ideations. She does endorse almost daily emotional eating (never treated). She is struggling with emotional eating and using food for comfort to the extent that it is negatively impacting her health. She is attempting to work on behavior modification techniques to help reduce her emotional eating.    At risk for diabetes mellitus Tiffany Norton is at higher than average risk for developing diabetes due to her obesity and prediabetes.   Assessment/Plan:   Other fatigue - Plan: EKG 12-Lead, Vitamin B12, CBC with Differential/Platelet, Folate, T3, T4, free, TSH, VITAMIN D 25 Hydroxy (Vit-D Deficiency, Fractures) Tiffany Norton does feel that her weight is causing her energy to be lower than it should be. Fatigue may be related to obesity, depression or many other causes. Labs and indirect calorimetry will be ordered, and in the meanwhile, Tiffany Norton will focus on self care including making healthy food choices, increasing physical activity and focusing on stress reduction. EKG ordered today shows normal sinus rhythm at 99 BPM.  Shortness of breath on exertion Tiffany Norton does feel that she gets out of breath more easily that she used to when she exercises.  Tiffany Norton's shortness of breath appears to be obesity related and exercise induced. She has agreed to work on weight loss and gradually increase exercise to treat her exercise induced shortness of breath. Will continue to monitor closely.  Essential hypertension Tiffany Norton is working on healthy weight loss and exercise to improve blood pressure control. We will watch for signs of hypotension as she continues her lifestyle modifications. We will order CMP and EKG today.  Other hyperlipidemia - Plan: Lipid Panel With LDL/HDL Ratio Cardiovascular risk and specific lipid/LDL goals reviewed.  We discussed several lifestyle modifications today and Tiffany Norton will begin to work on diet, exercise and weight loss efforts. We will check fasting lipid panel today. Orders and follow up as documented in patient record.   Counseling Intensive lifestyle modifications are the first line treatment for this issue. . Dietary changes: Increase soluble fiber. Decrease simple carbohydrates. . Exercise changes: Moderate to vigorous-intensity aerobic activity 150 minutes per week if tolerated. . Lipid-lowering medications: see documented in medical record.  Prediabetes - Plan: Comprehensive metabolic panel, Hemoglobin A1c, Insulin, random Tiffany Norton will begin to work on weight loss, exercise, and decreasing simple carbohydrates to help decrease the risk of diabetes. We will check Hgb A1c and insulin level today.  Other depression, with emotional eating (new) Behavior modification techniques were discussed  today to help Tiffany Norton deal with her emotional/non-hunger eating behaviors. We will refer patient to Dr. Mallie Mussel, our bariatric psychologist. Orders and follow up as documented in patient record.   At risk for diabetes mellitus Tiffany Norton was given approximately 15 minutes of diabetes education and counseling today. We discussed intensive lifestyle modifications today with an emphasis on weight loss as well as increasing exercise  and decreasing simple carbohydrates in her diet. We also reviewed medication options with an emphasis on risk versus benefit of those discussed.   Repetitive spaced learning was employed today to elicit superior memory formation and behavioral change.  Class 3 severe obesity with serious comorbidity and body mass index (BMI) of 45.0 to 49.9 in adult, unspecified obesity type (HCC) Tiffany Norton is currently in the action stage of change and her goal is to continue with weight loss efforts. I recommend Tiffany Norton begin the structured treatment plan as follows:  She has agreed to the Category 3 Plan.  Exercise goals: For substantial health benefits, adults should do at least 150 minutes (2 hours and 30 minutes) a week of moderate-intensity, or 75 minutes (1 hour and 15 minutes) a week of vigorous-intensity aerobic physical activity, or an equivalent combination of moderate- and vigorous-intensity aerobic activity. Aerobic activity should be performed in episodes of at least 10 minutes, and preferably, it should be spread throughout the week. Adults should also include muscle-strengthening activities that involve all major muscle groups on 2 or more days a week.   Behavioral modification strategies: increasing lean protein intake, increasing vegetables, meal planning and cooking strategies, keeping healthy foods in the home and planning for success.  She was informed of the importance of frequent follow-up visits to maximize her success with intensive lifestyle modifications for her multiple health conditions. She was informed we would discuss her lab results at her next visit unless there is a critical issue that needs to be addressed sooner. Tiffany Norton agreed to keep her next visit at the agreed upon time to discuss these results.  Objective:   Blood pressure 130/76, pulse 96, temperature 98.2 F (36.8 C), temperature source Oral, height 5\' 6"  (1.676 m), weight 290 lb (131.5 kg), SpO2 96 %. Body mass index is  46.81 kg/m.  EKG: Normal sinus rhythm, rate 99 BPM.  Indirect Calorimeter completed today shows a VO2 of 306 and a REE of 2136.  Her calculated basal metabolic rate is 2993 thus her basal metabolic rate is better than expected.  General: Cooperative, alert, well developed, in no acute distress. HEENT: Conjunctivae and lids unremarkable. Cardiovascular: Regular rhythm.  Lungs: Normal work of breathing. Neurologic: No focal deficits.   Lab Results  Component Value Date   CREATININE 0.89 09/08/2019   BUN 16 09/08/2019   NA 141 09/08/2019   K 5.0 09/08/2019   CL 104 09/08/2019   CO2 24 09/08/2019   Lab Results  Component Value Date   ALT 51 (H) 09/08/2019   AST 31 09/08/2019   ALKPHOS 167 (H) 09/08/2019   BILITOT 0.5 09/08/2019   Lab Results  Component Value Date   HGBA1C 5.8 (H) 09/08/2019   Lab Results  Component Value Date   INSULIN WILL FOLLOW 09/08/2019   Lab Results  Component Value Date   TSH 1.570 09/08/2019   Lab Results  Component Value Date   CHOL 153 09/08/2019   HDL 45 09/08/2019   LDLCALC 89 09/08/2019   TRIG 102 09/08/2019   Lab Results  Component Value Date   WBC 10.1 09/08/2019  HGB 15.1 09/08/2019   HCT 43.8 09/08/2019   MCV 87 09/08/2019   PLT 320 09/08/2019   No results found for: IRON, TIBC, FERRITIN  Vitamin D There are no recent results  Attestation Statements:   Reviewed by clinician on day of visit: allergies, medications, problem list, medical history, surgical history, family history, social history, and previous encounter notes.  I, Nevada Crane, am acting as transcriptionist for Filbert Schilder, MD.  I have reviewed the above documentation for accuracy and completeness, and I agree with the above. Filbert Schilder, MD

## 2019-09-19 ENCOUNTER — Encounter (INDEPENDENT_AMBULATORY_CARE_PROVIDER_SITE_OTHER): Payer: Self-pay | Admitting: Family Medicine

## 2019-09-20 NOTE — Telephone Encounter (Signed)
Please advise 

## 2019-09-21 MED ORDER — VENLAFAXINE HCL 75 MG PO TABS: 75.0000 mg | ORAL_TABLET | Freq: Every day | ORAL | 11 refills | Status: DC

## 2019-09-21 NOTE — Telephone Encounter (Signed)
Okay for refill?  

## 2019-09-22 ENCOUNTER — Ambulatory Visit (INDEPENDENT_AMBULATORY_CARE_PROVIDER_SITE_OTHER): Payer: 59 | Admitting: Family Medicine

## 2019-09-23 ENCOUNTER — Ambulatory Visit (INDEPENDENT_AMBULATORY_CARE_PROVIDER_SITE_OTHER): Payer: 59 | Admitting: Family Medicine

## 2019-09-23 ENCOUNTER — Encounter (INDEPENDENT_AMBULATORY_CARE_PROVIDER_SITE_OTHER): Payer: Self-pay | Admitting: Family Medicine

## 2019-09-23 ENCOUNTER — Other Ambulatory Visit: Payer: Self-pay | Admitting: Women's Health

## 2019-09-23 ENCOUNTER — Other Ambulatory Visit: Payer: Self-pay

## 2019-09-23 VITALS — BP 146/84 | HR 104 | Temp 98.4°F | Ht 66.0 in | Wt 286.0 lb

## 2019-09-23 DIAGNOSIS — I10 Essential (primary) hypertension: Secondary | ICD-10-CM | POA: Diagnosis not present

## 2019-09-23 DIAGNOSIS — K760 Fatty (change of) liver, not elsewhere classified: Secondary | ICD-10-CM

## 2019-09-23 DIAGNOSIS — E559 Vitamin D deficiency, unspecified: Secondary | ICD-10-CM | POA: Diagnosis not present

## 2019-09-23 DIAGNOSIS — Z6841 Body Mass Index (BMI) 40.0 and over, adult: Secondary | ICD-10-CM

## 2019-09-23 DIAGNOSIS — R7303 Prediabetes: Secondary | ICD-10-CM

## 2019-09-23 DIAGNOSIS — Z9189 Other specified personal risk factors, not elsewhere classified: Secondary | ICD-10-CM | POA: Diagnosis not present

## 2019-09-23 MED ORDER — VITAMIN D (ERGOCALCIFEROL) 1.25 MG (50000 UNIT) PO CAPS: 50000.0000 [IU] | ORAL_CAPSULE | ORAL | 0 refills | Status: DC

## 2019-09-23 MED ORDER — METFORMIN HCL 500 MG PO TABS: 500.0000 mg | ORAL_TABLET | Freq: Every day | ORAL | 0 refills | Status: DC

## 2019-09-23 NOTE — Telephone Encounter (Signed)
It looks like she just had filled?  Office visit if continued problems.  Okay for refill

## 2019-09-27 NOTE — Progress Notes (Signed)
Chief Complaint:   OBESITY Tiffany Norton is here to discuss her progress with her obesity treatment plan along with follow-up of her obesity related diagnoses. Tiffany Norton is on the Category 3 Plan and states she is following her eating plan approximately 100% of the time. Tiffany Norton states she is exercising for 0 minutes 0 times per week.  Today's visit was #: 2 Starting weight: 290 lbs Starting date: 09/08/2019 Today's weight: 286 lbs Today's date: 09/23/2019 Total lbs lost to date: 4 lbs Total lbs lost since last in-office visit: 4 lbs  Interim History: Tiffany Norton reports that the first few weeks initially were overwhelming.  She had no issues with staying on plan.  No hunger.  She is doing breakfast with yogurt options due to time, but cannot find whipped peanut butter.  She is eating a mini Kind bar for a snack, coffee creamer.  She has been weighing her meat and getting in around 10 ounces at dinner.  Subjective:   1. Vitamin D deficiency Tiffany Norton's Vitamin D level was 23.9 on 09/08/2019. She is not currently taking vit D. She endorses fatigue.  2. Prediabetes Tiffany Norton has a diagnosis of prediabetes based on her elevated HgA1c and was informed this puts her at greater risk of developing diabetes. She continues to work on diet and exercise to decrease her risk of diabetes. She denies nausea or hypoglycemia.  She is not on medications.  She had this diagnosis previously.  Lab Results  Component Value Date   HGBA1C 5.8 (H) 09/08/2019   Lab Results  Component Value Date   INSULIN 21.1 09/08/2019   3. Essential hypertension Blood pressure is slightly elevated.  No chest pain, chest pressure, headache.  On Micardis 40 mg daily.   BP Readings from Last 3 Encounters:  09/23/19 (!) 146/84  09/08/19 130/76  08/17/19 124/83   4. NAFLD (nonalcoholic fatty liver disease) LFTs were elevated for awhile, per patient.  Ultrasound form 03/2019 shows fatty liver.  Lab Results  Component Value Date   ALT 51 (H) 09/08/2019   AST 31 09/08/2019   ALKPHOS 167 (H) 09/08/2019   BILITOT 0.5 09/08/2019   5. At risk for deficient knowledge of diabetes mellitus Tiffany Norton is at higher than average risk for developing diabetes due to her obesity.   Assessment/Plan:   1. Vitamin D deficiency Low Vitamin D level contributes to fatigue and are associated with obesity, breast, and colon cancer. She agrees to continue to take prescription Vitamin D @50 ,000 IU every week and will follow-up for routine testing of Vitamin D, at least 2-3 times per year to avoid over-replacement. - Vitamin D, Ergocalciferol, (DRISDOL) 1.25 MG (50000 UNIT) CAPS capsule; Take 1 capsule (50,000 Units total) by mouth every 7 (seven) days.  Dispense: 4 capsule; Refill: 0  2. Prediabetes Tiffany Norton will continue to work on weight loss, exercise, and decreasing simple carbohydrates to help decrease the risk of diabetes.  - metFORMIN (GLUCOPHAGE) 500 MG tablet; Take 1 tablet (500 mg total) by mouth daily with breakfast.  Dispense: 30 tablet; Refill: 0  3. Essential hypertension Tiffany Norton is working on healthy weight loss and exercise to improve blood pressure control. We will watch for signs of hypotension as she continues her lifestyle modifications.  4. NAFLD (nonalcoholic fatty liver disease) Will check CMP at next lab draw.  5. At risk for deficient knowledge of diabetes mellitus Tiffany Norton was given approximately 15 minutes of diabetes education and counseling today. We discussed intensive lifestyle modifications today with an emphasis  on weight loss as well as increasing exercise and decreasing simple carbohydrates in her diet. We also reviewed medication options with an emphasis on risk versus benefit of those discussed.   6. Class 3 severe obesity with serious comorbidity and body mass index (BMI) of 45.0 to 49.9 in adult, unspecified obesity type (HCC) Tiffany Norton is currently in the action stage of change. As such, her goal is to  continue with weight loss efforts. She has agreed to the Category 3 Plan.   Exercise goals: For substantial health benefits, adults should do at least 150 minutes (2 hours and 30 minutes) a week of moderate-intensity, or 75 minutes (1 hour and 15 minutes) a week of vigorous-intensity aerobic physical activity, or an equivalent combination of moderate- and vigorous-intensity aerobic activity. Aerobic activity should be performed in episodes of at least 10 minutes, and preferably, it should be spread throughout the week. Adults should also include muscle-strengthening activities that involve all major muscle groups on 2 or more days a week.  Behavioral modification strategies: increasing lean protein intake, increasing vegetables, meal planning and cooking strategies, keeping healthy foods in the home and planning for success.  Tiffany Norton has agreed to follow-up with our clinic in 2 weeks. She was informed of the importance of frequent follow-up visits to maximize her success with intensive lifestyle modifications for her multiple health conditions.   Objective:   Blood pressure (!) 146/84, pulse (!) 104, temperature 98.4 F (36.9 C), temperature source Oral, height 5\' 6"  (1.676 m), weight 286 lb (129.7 kg), SpO2 97 %. Body mass index is 46.16 kg/m.  General: Cooperative, alert, well developed, in no acute distress. HEENT: Conjunctivae and lids unremarkable. Cardiovascular: Regular rhythm.  Lungs: Normal work of breathing. Neurologic: No focal deficits.   Lab Results  Component Value Date   CREATININE 0.89 09/08/2019   BUN 16 09/08/2019   NA 141 09/08/2019   K 5.0 09/08/2019   CL 104 09/08/2019   CO2 24 09/08/2019   Lab Results  Component Value Date   ALT 51 (H) 09/08/2019   AST 31 09/08/2019   ALKPHOS 167 (H) 09/08/2019   BILITOT 0.5 09/08/2019   Lab Results  Component Value Date   HGBA1C 5.8 (H) 09/08/2019   Lab Results  Component Value Date   INSULIN 21.1 09/08/2019   Lab  Results  Component Value Date   TSH 1.570 09/08/2019   Lab Results  Component Value Date   CHOL 153 09/08/2019   HDL 45 09/08/2019   LDLCALC 89 09/08/2019   TRIG 102 09/08/2019   Lab Results  Component Value Date   WBC 10.1 09/08/2019   HGB 15.1 09/08/2019   HCT 43.8 09/08/2019   MCV 87 09/08/2019   PLT 320 09/08/2019   Attestation Statements:   Reviewed by clinician on day of visit: allergies, medications, problem list, medical history, surgical history, family history, social history, and previous encounter notes.  I, 09/10/2019, CMA, am acting as transcriptionist for Insurance claims handler, MD.  I have reviewed the above documentation for accuracy and completeness, and I agree with the above. - Debbra Riding, MD

## 2019-10-06 ENCOUNTER — Encounter (INDEPENDENT_AMBULATORY_CARE_PROVIDER_SITE_OTHER): Payer: Self-pay | Admitting: Family Medicine

## 2019-10-06 ENCOUNTER — Ambulatory Visit (INDEPENDENT_AMBULATORY_CARE_PROVIDER_SITE_OTHER): Payer: 59 | Admitting: Family Medicine

## 2019-10-06 ENCOUNTER — Other Ambulatory Visit: Payer: Self-pay

## 2019-10-06 VITALS — BP 113/77 | HR 98 | Temp 97.7°F | Ht 66.0 in | Wt 287.0 lb

## 2019-10-06 DIAGNOSIS — Z6841 Body Mass Index (BMI) 40.0 and over, adult: Secondary | ICD-10-CM

## 2019-10-06 DIAGNOSIS — I10 Essential (primary) hypertension: Secondary | ICD-10-CM

## 2019-10-06 DIAGNOSIS — R7303 Prediabetes: Secondary | ICD-10-CM

## 2019-10-07 ENCOUNTER — Ambulatory Visit (INDEPENDENT_AMBULATORY_CARE_PROVIDER_SITE_OTHER): Payer: 59 | Admitting: Family Medicine

## 2019-10-11 NOTE — Progress Notes (Signed)
Chief Complaint:   OBESITY Tiffany Norton is here to discuss her progress with her obesity treatment plan along with follow-up of her obesity related diagnoses. Tiffany Norton is on the Category 3 Plan and states she is following her eating plan approximately 90% of the time. Tiffany Norton states she is exercising 0 minutes 0 times per week.  Today's visit was #: 3 Starting weight: 290 lbs Starting date: 09/08/2019 Today's weight: 287 lbs Today's date: 10/06/2019 Total lbs lost to date: 3 Total lbs lost since last in-office visit: 0  Interim History: Tiffany Norton voices she is going out to eat quite a bit, 3 to 4 times per week at places like a favorite [employer]. Patient voices that she tries to make healthy nutritious choices when eating out, but she does not weigh meat, or take oils into account.  Subjective:   Essential hypertension Tiffany Norton's blood pressure is controlled. She denies chest pain, chest pressure or headache.  BP Readings from Last 3 Encounters:  10/06/19 113/77  09/23/19 (!) 146/84  09/08/19 130/76   Lab Results  Component Value Date   CREATININE 0.89 09/08/2019   Prediabetes Tiffany Norton has a diagnosis of prediabetes and her last A1c was 5.8 and last insulin level was 21.1 (09/08/19). She is on Metformin without any GI side effects. She continues to work on diet and exercise to decrease her risk of diabetes.  Lab Results  Component Value Date   HGBA1C 5.8 (H) 09/08/2019   Lab Results  Component Value Date   INSULIN 21.1 09/08/2019    Assessment/Plan:   Essential hypertension Tiffany Norton is working on healthy weight loss and exercise to improve blood pressure control. Tiffany Norton will continue taking Micardis. We will watch for signs of hypotension as she continues her lifestyle modifications.  Prediabetes Tiffany Norton will continue metformin (no refill needed) and she will continue to work on weight loss, exercise, and decreasing simple carbohydrates to help decrease  the risk of diabetes.   Class 3 severe obesity with serious comorbidity and body mass index (BMI) of 45.0 to 49.9 in adult, unspecified obesity type (HCC) Tiffany Norton is currently in the action stage of change. As such, her goal is to continue with weight loss efforts. She has agreed to the Category 4 Plan and keeping a food journal and adhering to recommended goals of 500 to 650 calories and 45+ grams of protein at supper daily.   Behavioral modification strategies: increasing lean protein intake, increasing vegetables, meal planning and cooking strategies, keeping healthy foods in the home and planning for success.  Tiffany Norton has agreed to follow-up with our clinic in 2 weeks. She was informed of the importance of frequent follow-up visits to maximize her success with intensive lifestyle modifications for her multiple health conditions.   Objective:   Blood pressure 113/77, pulse 98, temperature 97.7 F (36.5 C), temperature source Oral, height 5\' 6"  (1.676 m), weight 287 lb (130.2 kg), SpO2 98 %. Body mass index is 46.32 kg/m.  General: Cooperative, alert, well developed, in no acute distress. HEENT: Conjunctivae and lids unremarkable. Cardiovascular: Regular rhythm.  Lungs: Normal work of breathing. Neurologic: No focal deficits.   Lab Results  Component Value Date   CREATININE 0.89 09/08/2019   BUN 16 09/08/2019   NA 141 09/08/2019   K 5.0 09/08/2019   CL 104 09/08/2019   CO2 24 09/08/2019   Lab Results  Component Value Date   ALT 51 (H) 09/08/2019   AST 31 09/08/2019   ALKPHOS 167 (H)  09/08/2019   BILITOT 0.5 09/08/2019   Lab Results  Component Value Date   HGBA1C 5.8 (H) 09/08/2019   Lab Results  Component Value Date   INSULIN 21.1 09/08/2019   Lab Results  Component Value Date   TSH 1.570 09/08/2019   Lab Results  Component Value Date   CHOL 153 09/08/2019   HDL 45 09/08/2019   LDLCALC 89 09/08/2019   TRIG 102 09/08/2019   Lab Results  Component Value Date     WBC 10.1 09/08/2019   HGB 15.1 09/08/2019   HCT 43.8 09/08/2019   MCV 87 09/08/2019   PLT 320 09/08/2019   No results found for: IRON, TIBC, FERRITIN   Ref. Range 09/08/2019 14:12  Vitamin D, 25-Hydroxy Latest Ref Range: 30.0 - 100.0 ng/mL 23.9 (L)    Attestation Statements:   Reviewed by clinician on day of visit: allergies, medications, problem list, medical history, surgical history, family history, social history, and previous encounter notes.  Time spent on visit including pre-visit chart review and post-visit care was 17 minutes.   I, Doreene Nest, am acting as transcriptionist for Eber Jones, MD.  I have reviewed the above documentation for accuracy and completeness, and I agree with the above. - Ilene Qua, MD

## 2019-10-13 ENCOUNTER — Other Ambulatory Visit (INDEPENDENT_AMBULATORY_CARE_PROVIDER_SITE_OTHER): Payer: Self-pay | Admitting: Family Medicine

## 2019-10-13 DIAGNOSIS — E559 Vitamin D deficiency, unspecified: Secondary | ICD-10-CM

## 2019-10-15 ENCOUNTER — Other Ambulatory Visit (INDEPENDENT_AMBULATORY_CARE_PROVIDER_SITE_OTHER): Payer: Self-pay | Admitting: Family Medicine

## 2019-10-15 ENCOUNTER — Other Ambulatory Visit: Payer: Self-pay | Admitting: Women's Health

## 2019-10-15 DIAGNOSIS — R7303 Prediabetes: Secondary | ICD-10-CM

## 2019-10-21 ENCOUNTER — Encounter (INDEPENDENT_AMBULATORY_CARE_PROVIDER_SITE_OTHER): Payer: Self-pay | Admitting: Family Medicine

## 2019-10-21 ENCOUNTER — Ambulatory Visit (INDEPENDENT_AMBULATORY_CARE_PROVIDER_SITE_OTHER): Payer: 59 | Admitting: Family Medicine

## 2019-10-21 ENCOUNTER — Other Ambulatory Visit: Payer: Self-pay

## 2019-10-21 VITALS — BP 119/76 | HR 97 | Temp 98.0°F | Ht 66.0 in | Wt 279.0 lb

## 2019-10-21 DIAGNOSIS — Z9189 Other specified personal risk factors, not elsewhere classified: Secondary | ICD-10-CM

## 2019-10-21 DIAGNOSIS — R7303 Prediabetes: Secondary | ICD-10-CM | POA: Diagnosis not present

## 2019-10-21 DIAGNOSIS — Z6841 Body Mass Index (BMI) 40.0 and over, adult: Secondary | ICD-10-CM

## 2019-10-21 DIAGNOSIS — E559 Vitamin D deficiency, unspecified: Secondary | ICD-10-CM | POA: Diagnosis not present

## 2019-10-21 MED ORDER — VITAMIN D (ERGOCALCIFEROL) 1.25 MG (50000 UNIT) PO CAPS: 50000.0000 [IU] | ORAL_CAPSULE | ORAL | 0 refills | Status: DC

## 2019-10-21 NOTE — Progress Notes (Signed)
Chief Complaint:   OBESITY Tiffany Norton is here to discuss her progress with her obesity treatment plan along with follow-up of her obesity related diagnoses. Tiffany Norton is on the Category 3 Plan and states she is following her eating plan approximately 90% of the time. Tiffany Norton states she is exercising for 0 minutes 0 times per week.  Today's visit was #: 4 Starting weight: 290 lbs Starting date: 09/08/2019 Today's weight: 279 lbs Today's date: 10/21/2019 Total lbs lost to date: 11 lbs Total lbs lost since last in-office visit: 8 lbs  Interim History: Tiffany Norton felt she worked hard these past few weeks.  She has lmited eating out and tried cooking at home more.  She realizes she has more control if she eats out.  No anticipated challenges in the next 2 weeks.  Subjective:   1. Prediabetes Tiffany Norton has a diagnosis of prediabetes based on her elevated HgA1c and was informed this puts her at greater risk of developing diabetes. She continues to work on diet and exercise to decrease her risk of diabetes. She denies nausea or hypoglycemia.  Tiffany Norton has had significant side effects on metformin with cramping, pain, and diarrhea.  Lab Results  Component Value Date   HGBA1C 5.8 (H) 09/08/2019   Lab Results  Component Value Date   INSULIN 21.1 09/08/2019   2. Vitamin D deficiency Tiffany Norton's Vitamin D level was 23.9 on 09/08/2019. She is currently taking vit D. She denies nausea, vomiting or muscle weakness.  She endorses fatigue.  3. At risk for osteoporosis Tiffany Norton is at higher risk of osteopenia and osteoporosis due to Vitamin D deficiency.   Assessment/Plan:   1. Prediabetes Tiffany Norton will continue to work on weight loss, exercise, and decreasing simple carbohydrates to help decrease the risk of diabetes. Stop metformin and repeat labs in 2 months.  2. Vitamin D deficiency Low Vitamin D level contributes to fatigue and are associated with obesity, breast, and colon cancer. She agrees to  continue to take prescription Vitamin D @50 ,000 IU every week and will follow-up for routine testing of Vitamin D, at least 2-3 times per year to avoid over-replacement. - Vitamin D, Ergocalciferol, (DRISDOL) 1.25 MG (50000 UNIT) CAPS capsule; Take 1 capsule (50,000 Units total) by mouth every 7 (seven) days.  Dispense: 4 capsule; Refill: 0  3. At risk for osteoporosis Tiffany Norton was given approximately 15 minutes of osteoporosis prevention counseling today. Tiffany Norton is at risk for osteopenia and osteoporosis due to her Vitamin D deficiency. She was encouraged to take her Vitamin D and follow her higher calcium diet and increase strengthening exercise to help strengthen her bones and decrease her risk of osteopenia and osteoporosis.  Repetitive spaced learning was employed today to elicit superior memory formation and behavioral change.  4. Class 3 severe obesity with serious comorbidity and body mass index (BMI) of 45.0 to 49.9 in adult, unspecified obesity type (HCC) Tiffany Norton is currently in the action stage of change. As such, her goal is to continue with weight loss efforts. She has agreed to the Category 3 Plan.   Exercise goals: No exercise has been prescribed at this time.  Behavioral modification strategies: increasing lean protein intake, meal planning and cooking strategies, keeping healthy foods in the home and planning for success.  Tiffany Norton has agreed to follow-up with our clinic in 2 weeks. She was informed of the importance of frequent follow-up visits to maximize her success with intensive lifestyle modifications for her multiple health conditions.   Objective:  Blood pressure 119/76, pulse 97, temperature 98 F (36.7 C), temperature source Oral, height 5\' 6"  (1.676 m), weight 279 lb (126.6 kg), SpO2 96 %. Body mass index is 45.03 kg/m.  General: Cooperative, alert, well developed, in no acute distress. HEENT: Conjunctivae and lids unremarkable. Cardiovascular: Regular rhythm.    Lungs: Normal work of breathing. Neurologic: No focal deficits.   Lab Results  Component Value Date   CREATININE 0.89 09/08/2019   BUN 16 09/08/2019   NA 141 09/08/2019   K 5.0 09/08/2019   CL 104 09/08/2019   CO2 24 09/08/2019   Lab Results  Component Value Date   ALT 51 (H) 09/08/2019   AST 31 09/08/2019   ALKPHOS 167 (H) 09/08/2019   BILITOT 0.5 09/08/2019   Lab Results  Component Value Date   HGBA1C 5.8 (H) 09/08/2019   Lab Results  Component Value Date   INSULIN 21.1 09/08/2019   Lab Results  Component Value Date   TSH 1.570 09/08/2019   Lab Results  Component Value Date   CHOL 153 09/08/2019   HDL 45 09/08/2019   LDLCALC 89 09/08/2019   TRIG 102 09/08/2019   Lab Results  Component Value Date   WBC 10.1 09/08/2019   HGB 15.1 09/08/2019   HCT 43.8 09/08/2019   MCV 87 09/08/2019   PLT 320 09/08/2019   Attestation Statements:   Reviewed by clinician on day of visit: allergies, medications, problem list, medical history, surgical history, family history, social history, and previous encounter notes.  I, 09/10/2019, CMA, am acting as transcriptionist for Insurance claims handler, MD.  I have reviewed the above documentation for accuracy and completeness, and I agree with the above. - Reuben Likes, MD

## 2019-10-28 ENCOUNTER — Ambulatory Visit: Payer: Self-pay

## 2019-11-04 ENCOUNTER — Encounter (INDEPENDENT_AMBULATORY_CARE_PROVIDER_SITE_OTHER): Payer: Self-pay | Admitting: Family Medicine

## 2019-11-04 ENCOUNTER — Ambulatory Visit (INDEPENDENT_AMBULATORY_CARE_PROVIDER_SITE_OTHER): Payer: 59 | Admitting: Family Medicine

## 2019-11-04 ENCOUNTER — Other Ambulatory Visit: Payer: Self-pay

## 2019-11-04 VITALS — BP 123/82 | HR 95 | Temp 97.9°F | Ht 66.0 in | Wt 279.0 lb

## 2019-11-04 DIAGNOSIS — Z6841 Body Mass Index (BMI) 40.0 and over, adult: Secondary | ICD-10-CM

## 2019-11-04 DIAGNOSIS — E559 Vitamin D deficiency, unspecified: Secondary | ICD-10-CM | POA: Diagnosis not present

## 2019-11-04 DIAGNOSIS — I10 Essential (primary) hypertension: Secondary | ICD-10-CM | POA: Diagnosis not present

## 2019-11-04 DIAGNOSIS — Z9189 Other specified personal risk factors, not elsewhere classified: Secondary | ICD-10-CM

## 2019-11-04 MED ORDER — TELMISARTAN 20 MG PO TABS: 20.0000 mg | ORAL_TABLET | Freq: Every day | ORAL | 0 refills | Status: DC

## 2019-11-04 NOTE — Progress Notes (Signed)
Chief Complaint:   OBESITY Tiffany Norton is here to discuss her progress with her obesity treatment plan along with follow-up of her obesity related diagnoses. Tiffany Norton is on the Category 3 Plan and states she is following her eating plan approximately 90% of the time. Tiffany Norton states she is exercising for 0 minutes 0 times per week.  Today's visit was #: 5 Starting weight: 290 lbs Starting date: 09/08/2019 Today's weight: 279 lbs Today's date: 11/04/2019 Total lbs lost to date: 11 lbs Total lbs lost since last in-office visit: 0  Interim History: Tiffany Norton is working on staying on plan.  For breakfast she will have toast with peanut butter, yogurt, strung cheese, milk.  For lunch, she is doing [employer], apple, and yogurt or cottage cheese or 2 packs of tuna, V8, and an apple or pear.  For dinner, she is having a protein and a vegetable.  She is not really hungry.  She says she has no obstacles in the next few weeks.  Subjective:   1. Essential hypertension Review: taking medications as instructed, no medication side effects noted, no chest pain on exertion, no dyspnea on exertion, no swelling of ankles.  Blood pressure is well-controlled today on telmisartan.   BP Readings from Last 3 Encounters:  11/04/19 123/82  10/21/19 119/76  10/06/19 113/77   2. Vitamin D deficiency Tiffany Norton's Vitamin D level was 12.9 on 09/08/2019. She is currently taking vit D. She denies nausea, vomiting or muscle weakness.  3. At risk for heart disease Tiffany Norton is at a higher than average risk for cardiovascular disease due to obesity. Reviewed: no chest pain on exertion, no dyspnea on exertion, and no swelling of ankles.  Assessment/Plan:   1. Essential hypertension Tiffany Norton is working on healthy weight loss and exercise to improve blood pressure control. We will watch for signs of hypotension as she continues her lifestyle modifications.  Will decrease telmisartan to 20 mg daily. - telmisartan (MICARDIS)  20 MG tablet; Take 1 tablet (20 mg total) by mouth daily.  Dispense: 30 tablet; Refill: 0  2. Vitamin D deficiency Low Vitamin D level contributes to fatigue and are associated with obesity, breast, and colon cancer. She agrees to continue to take prescription Vitamin D @50 ,000 IU every week and will follow-up for routine testing of Vitamin D, at least 2-3 times per year to avoid over-replacement.  3. At risk for heart disease Tiffany Norton was given approximately 15 minutes of coronary artery disease prevention counseling today. She is 64 y.o. female and has risk factors for heart disease including obesity. We discussed intensive lifestyle modifications today with an emphasis on specific weight loss instructions and strategies.   Repetitive spaced learning was employed today to elicit superior memory formation and behavioral change.  4. Class 3 severe obesity with serious comorbidity and body mass index (BMI) of 45.0 to 49.9 in adult, unspecified obesity type (HCC) Tiffany Norton is currently in the action stage of change. As such, her goal is to continue with weight loss efforts. She has agreed to the Category 4 Plan with 8 ounces at night.   Exercise goals: No exercise has been prescribed at this time.  Behavioral modification strategies: increasing lean protein intake, increasing vegetables, meal planning and cooking strategies, keeping healthy foods in the home and planning for success.  Tiffany Norton has agreed to follow-up with our clinic in 2 weeks. She was informed of the importance of frequent follow-up visits to maximize her success with intensive lifestyle modifications for her multiple  health conditions.   Objective:   Blood pressure 123/82, pulse 95, temperature 97.9 F (36.6 C), temperature source Oral, height 5\' 6"  (1.676 m), weight 279 lb (126.6 kg), SpO2 98 %. Body mass index is 45.03 kg/m.  General: Cooperative, alert, well developed, in no acute distress. HEENT: Conjunctivae and lids  unremarkable. Cardiovascular: Regular rhythm.  Lungs: Normal work of breathing. Neurologic: No focal deficits.   Lab Results  Component Value Date   CREATININE 0.89 09/08/2019   BUN 16 09/08/2019   NA 141 09/08/2019   K 5.0 09/08/2019   CL 104 09/08/2019   CO2 24 09/08/2019   Lab Results  Component Value Date   ALT 51 (H) 09/08/2019   AST 31 09/08/2019   ALKPHOS 167 (H) 09/08/2019   BILITOT 0.5 09/08/2019   Lab Results  Component Value Date   HGBA1C 5.8 (H) 09/08/2019   Lab Results  Component Value Date   INSULIN 21.1 09/08/2019   Lab Results  Component Value Date   TSH 1.570 09/08/2019   Lab Results  Component Value Date   CHOL 153 09/08/2019   HDL 45 09/08/2019   LDLCALC 89 09/08/2019   TRIG 102 09/08/2019   Lab Results  Component Value Date   WBC 10.1 09/08/2019   HGB 15.1 09/08/2019   HCT 43.8 09/08/2019   MCV 87 09/08/2019   PLT 320 09/08/2019   Attestation Statements:   Reviewed by clinician on day of visit: allergies, medications, problem list, medical history, surgical history, family history, social history, and previous encounter notes.  I, 09/10/2019, CMA, am acting as transcriptionist for Insurance claims handler, MD.  I have reviewed the above documentation for accuracy and completeness, and I agree with the above. - Reuben Likes, MD

## 2019-11-13 ENCOUNTER — Other Ambulatory Visit (INDEPENDENT_AMBULATORY_CARE_PROVIDER_SITE_OTHER): Payer: Self-pay | Admitting: Family Medicine

## 2019-11-13 DIAGNOSIS — E559 Vitamin D deficiency, unspecified: Secondary | ICD-10-CM

## 2019-11-14 ENCOUNTER — Other Ambulatory Visit (INDEPENDENT_AMBULATORY_CARE_PROVIDER_SITE_OTHER): Payer: Self-pay | Admitting: Family Medicine

## 2019-11-14 DIAGNOSIS — E559 Vitamin D deficiency, unspecified: Secondary | ICD-10-CM

## 2019-11-27 ENCOUNTER — Other Ambulatory Visit (INDEPENDENT_AMBULATORY_CARE_PROVIDER_SITE_OTHER): Payer: Self-pay | Admitting: Family Medicine

## 2019-11-27 DIAGNOSIS — I10 Essential (primary) hypertension: Secondary | ICD-10-CM

## 2019-11-29 ENCOUNTER — Ambulatory Visit (INDEPENDENT_AMBULATORY_CARE_PROVIDER_SITE_OTHER): Payer: 59 | Admitting: Family Medicine

## 2019-11-29 ENCOUNTER — Other Ambulatory Visit: Payer: Self-pay

## 2019-11-29 VITALS — BP 123/82 | HR 95 | Temp 97.8°F | Ht 66.0 in | Wt 273.0 lb

## 2019-11-29 DIAGNOSIS — I10 Essential (primary) hypertension: Secondary | ICD-10-CM | POA: Diagnosis not present

## 2019-11-29 DIAGNOSIS — E559 Vitamin D deficiency, unspecified: Secondary | ICD-10-CM

## 2019-11-29 DIAGNOSIS — Z6841 Body Mass Index (BMI) 40.0 and over, adult: Secondary | ICD-10-CM

## 2019-11-29 DIAGNOSIS — Z9189 Other specified personal risk factors, not elsewhere classified: Secondary | ICD-10-CM | POA: Diagnosis not present

## 2019-11-30 NOTE — Progress Notes (Signed)
Chief Complaint:   OBESITY Tiffany Norton is here to discuss her progress with her obesity treatment plan along with follow-up of her obesity related diagnoses. Tiffany Norton is on the Category 4 Plan and states she is following her eating plan approximately 95% of the time. Tiffany Norton states she is doing upper body exercises for 15-20 minutes 1 time per week.  Today's visit was #: 6 Starting weight: 290 lbs Starting date: 09/08/2019 Today's weight: 273 lbs Today's date: 11/29/2019 Total lbs lost to date: 17 Total lbs lost since last in-office visit: 6  Interim History: Tiffany Norton went to see her daughter for Easter and she voices she didn't struggle with motivation even with increase in time between appointments. She is occasionally feeling hungry between lunch and dinner in the week. Sometimes she will do extra cheeses or fruit for snack. She has no trips planned for the next few weeks.  Subjective:   1. Essential hypertension Tiffany Norton's blood pressure is controlled today. She denies chest pain, chest pressure, or headaches.  2. Vitamin D deficiency Tiffany Norton denies nausea, vomiting, or muscle weakness, but she notes fatigue. She is on prescription Vit D.  3. At risk for heart disease Tiffany Norton is at a higher than average risk for cardiovascular disease due to obesity.   Assessment/Plan:   1. Essential hypertension Tiffany Norton is working on healthy weight loss and exercise to improve blood pressure control. We will watch for signs of hypotension as she continues her lifestyle modifications. We will refill Micardis for 1 month.  - telmisartan (MICARDIS) 20 MG tablet; Take 1 tablet (20 mg total) by mouth daily.  Dispense: 30 tablet; Refill: 0  2. Vitamin D deficiency Low Vitamin D level contributes to fatigue and are associated with obesity, breast, and colon cancer. We will refill prescription Vitamin D for 1 month. Tiffany Norton will follow-up for routine testing of Vitamin D, at least 2-3 times per year to  avoid over-replacement.  - Vitamin D, Ergocalciferol, (DRISDOL) 1.25 MG (50000 UNIT) CAPS capsule; Take 1 capsule (50,000 Units total) by mouth every 7 (seven) days.  Dispense: 4 capsule; Refill: 0  3. At risk for heart disease Tiffany Norton was given approximately 15 minutes of coronary artery disease prevention counseling today. She is 64 y.o. female and has risk factors for heart disease including obesity. We discussed intensive lifestyle modifications today with an emphasis on specific weight loss instructions and strategies.   Repetitive spaced learning was employed today to elicit superior memory formation and behavioral change.  4. Class 3 severe obesity with serious comorbidity and body mass index (BMI) of 40.0 to 44.9 in adult, unspecified obesity type (HCC) Tiffany Norton is currently in the action stage of change. As such, her goal is to continue with weight loss efforts. She has agreed to the Category 4 Plan.   Exercise goals: No exercise has been prescribed at this time.  Behavioral modification strategies: increasing lean protein intake, increasing vegetables, meal planning and cooking strategies, keeping healthy foods in the home and planning for success.  Early has agreed to follow-up with our clinic in 2 to 3 weeks. She was informed of the importance of frequent follow-up visits to maximize her success with intensive lifestyle modifications for her multiple health conditions.   Objective:   Blood pressure 123/82, pulse 95, temperature 97.8 F (36.6 C), temperature source Oral, height 5\' 6"  (1.676 m), weight 273 lb (123.8 kg), SpO2 97 %. Body mass index is 44.06 kg/m.  General: Cooperative, alert, well developed, in no acute  distress. HEENT: Conjunctivae and lids unremarkable. Cardiovascular: Regular rhythm.  Lungs: Normal work of breathing. Neurologic: No focal deficits.   Lab Results  Component Value Date   CREATININE 0.89 09/08/2019   BUN 16 09/08/2019   NA 141 09/08/2019     K 5.0 09/08/2019   CL 104 09/08/2019   CO2 24 09/08/2019   Lab Results  Component Value Date   ALT 51 (H) 09/08/2019   AST 31 09/08/2019   ALKPHOS 167 (H) 09/08/2019   BILITOT 0.5 09/08/2019   Lab Results  Component Value Date   HGBA1C 5.8 (H) 09/08/2019   Lab Results  Component Value Date   INSULIN 21.1 09/08/2019   Lab Results  Component Value Date   TSH 1.570 09/08/2019   Lab Results  Component Value Date   CHOL 153 09/08/2019   HDL 45 09/08/2019   LDLCALC 89 09/08/2019   TRIG 102 09/08/2019   Lab Results  Component Value Date   WBC 10.1 09/08/2019   HGB 15.1 09/08/2019   HCT 43.8 09/08/2019   MCV 87 09/08/2019   PLT 320 09/08/2019   No results found for: IRON, TIBC, FERRITIN  Attestation Statements:   Reviewed by clinician on day of visit: allergies, medications, problem list, medical history, surgical history, family history, social history, and previous encounter notes.   I, Burt Knack, am acting as transcriptionist for Margarette Asal, MD.  I have reviewed the above documentation for accuracy and completeness, and I agree with the above. - Debbra Riding, MD

## 2019-12-05 ENCOUNTER — Encounter (INDEPENDENT_AMBULATORY_CARE_PROVIDER_SITE_OTHER): Payer: Self-pay | Admitting: Family Medicine

## 2019-12-06 ENCOUNTER — Other Ambulatory Visit (INDEPENDENT_AMBULATORY_CARE_PROVIDER_SITE_OTHER): Payer: Self-pay

## 2019-12-06 DIAGNOSIS — E559 Vitamin D deficiency, unspecified: Secondary | ICD-10-CM

## 2019-12-06 DIAGNOSIS — I10 Essential (primary) hypertension: Secondary | ICD-10-CM

## 2019-12-06 MED ORDER — VITAMIN D (ERGOCALCIFEROL) 1.25 MG (50000 UNIT) PO CAPS: 50000.0000 [IU] | ORAL_CAPSULE | ORAL | 0 refills | Status: DC

## 2019-12-06 MED ORDER — TELMISARTAN 20 MG PO TABS: 20.0000 mg | ORAL_TABLET | Freq: Every day | ORAL | 0 refills | Status: DC

## 2019-12-07 MED ORDER — VITAMIN D (ERGOCALCIFEROL) 1.25 MG (50000 UNIT) PO CAPS: 50000.0000 [IU] | ORAL_CAPSULE | ORAL | 0 refills | Status: DC

## 2019-12-07 MED ORDER — TELMISARTAN 20 MG PO TABS: 20.0000 mg | ORAL_TABLET | Freq: Every day | ORAL | 0 refills | Status: DC

## 2019-12-16 ENCOUNTER — Encounter (INDEPENDENT_AMBULATORY_CARE_PROVIDER_SITE_OTHER): Payer: Self-pay | Admitting: Family Medicine

## 2019-12-16 ENCOUNTER — Other Ambulatory Visit: Payer: Self-pay

## 2019-12-16 ENCOUNTER — Ambulatory Visit (INDEPENDENT_AMBULATORY_CARE_PROVIDER_SITE_OTHER): Payer: 59 | Admitting: Family Medicine

## 2019-12-16 VITALS — BP 140/78 | HR 80 | Temp 98.2°F | Ht 66.0 in | Wt 272.0 lb

## 2019-12-16 DIAGNOSIS — Z6841 Body Mass Index (BMI) 40.0 and over, adult: Secondary | ICD-10-CM

## 2019-12-16 DIAGNOSIS — I10 Essential (primary) hypertension: Secondary | ICD-10-CM | POA: Diagnosis not present

## 2019-12-16 DIAGNOSIS — E7849 Other hyperlipidemia: Secondary | ICD-10-CM

## 2019-12-16 NOTE — Progress Notes (Signed)
Chief Complaint:   OBESITY GENEVRA ORNE is here to discuss her progress with her obesity treatment plan along with follow-up of her obesity related diagnoses. Layali is on the Category 4 Plan and states she is following her eating plan approximately 90% of the time. Shekina states she is doing arm exercises 2 times per week.  Today's visit was #: 7 Starting weight: 290 lbs Starting date: 09/08/2019 Today's weight: 272 lbs Today's date: 12/16/2019 Total lbs lost to date: 18 Total lbs lost since last in-office visit: 1  Interim History: Cecile has been up to "same ole, same ole" in the past few weeks. She is doing okay with meal planning; is sometimes indecisive on the plan. She is eating out frequently and trying to make healthy choices. Scale is showing an increase in 2 lbs of water weight. She states her biggest obstacle is eating enough.  Subjective:   Essential hypertension. Blood pressure is controlled today, but borderline. No chest pain, chest pressure, or headache.  BP Readings from Last 3 Encounters:  12/16/19 140/78  11/29/19 123/82  11/04/19 123/82   Lab Results  Component Value Date   CREATININE 0.89 09/08/2019   Other hyperlipidemia. Shaya is on pravastatin.   Lab Results  Component Value Date   CHOL 153 09/08/2019   HDL 45 09/08/2019   LDLCALC 89 09/08/2019   TRIG 102 09/08/2019   Lab Results  Component Value Date   ALT 51 (H) 09/08/2019   AST 31 09/08/2019   ALKPHOS 167 (H) 09/08/2019   BILITOT 0.5 09/08/2019   The 10-year ASCVD risk score Denman George DC Jr., et al., 2013) is: 6.1%   Values used to calculate the score:     Age: 64 years     Sex: Female     Is Non-Hispanic African American: No     Diabetic: No     Tobacco smoker: No     Systolic Blood Pressure: 140 mmHg     Is BP treated: Yes     HDL Cholesterol: 45 mg/dL     Total Cholesterol: 153 mg/dL  Assessment/Plan:   Essential hypertension. Alannie is working on healthy weight  loss and exercise to improve blood pressure control. We will watch for signs of hypotension as she continues her lifestyle modifications. She will continue Micardis as directed.  Other hyperlipidemia. Cardiovascular risk and specific lipid/LDL goals reviewed.  We discussed several lifestyle modifications today and Tinleigh will continue to work on diet, exercise and weight loss efforts. Orders and follow up as documented in patient record. Labs will be repeated at her next appointment.  Counseling Intensive lifestyle modifications are the first line treatment for this issue. . Dietary changes: Increase soluble fiber. Decrease simple carbohydrates. . Exercise changes: Moderate to vigorous-intensity aerobic activity 150 minutes per week if tolerated. . Lipid-lowering medications: see documented in medical record.  Class 3 severe obesity with serious comorbidity and body mass index (BMI) of 40.0 to 44.9 in adult, unspecified obesity type (HCC).  Elzena is currently in the action stage of change. As such, her goal is to continue with weight loss efforts. She has agreed to the Category 4 Plan.   Exercise goals: Trinetta will increase activity to 10-15 minutes 3 times per week.  Behavioral modification strategies: increasing lean protein intake, increasing vegetables, no skipping meals, meal planning and cooking strategies, keeping healthy foods in the home and planning for success.  Baillie has agreed to follow-up with our clinic in 2 weeks. She  was informed of the importance of frequent follow-up visits to maximize her success with intensive lifestyle modifications for her multiple health conditions.   Objective:   Blood pressure 140/78, pulse 80, temperature 98.2 F (36.8 C), temperature source Oral, height 5\' 6"  (1.676 m), weight 272 lb (123.4 kg), SpO2 94 %. Body mass index is 43.9 kg/m.  General: Cooperative, alert, well developed, in no acute distress. HEENT: Conjunctivae and lids  unremarkable. Cardiovascular: Regular rhythm.  Lungs: Normal work of breathing. Neurologic: No focal deficits.   Lab Results  Component Value Date   CREATININE 0.89 09/08/2019   BUN 16 09/08/2019   NA 141 09/08/2019   K 5.0 09/08/2019   CL 104 09/08/2019   CO2 24 09/08/2019   Lab Results  Component Value Date   ALT 51 (H) 09/08/2019   AST 31 09/08/2019   ALKPHOS 167 (H) 09/08/2019   BILITOT 0.5 09/08/2019   Lab Results  Component Value Date   HGBA1C 5.8 (H) 09/08/2019   Lab Results  Component Value Date   INSULIN 21.1 09/08/2019   Lab Results  Component Value Date   TSH 1.570 09/08/2019   Lab Results  Component Value Date   CHOL 153 09/08/2019   HDL 45 09/08/2019   LDLCALC 89 09/08/2019   TRIG 102 09/08/2019   Lab Results  Component Value Date   WBC 10.1 09/08/2019   HGB 15.1 09/08/2019   HCT 43.8 09/08/2019   MCV 87 09/08/2019   PLT 320 09/08/2019   No results found for: IRON, TIBC, FERRITIN  Attestation Statements:   Reviewed by clinician on day of visit: allergies, medications, problem list, medical history, surgical history, family history, social history, and previous encounter notes.  Time spent on visit including pre-visit chart review and post-visit charting and care was 15 minutes.   I, [name], am acting as transcriptionist for Coralie Common, MD   I have reviewed the above documentation for accuracy and completeness, and I agree with the above. - Ilene Qua, MD

## 2019-12-27 ENCOUNTER — Other Ambulatory Visit (INDEPENDENT_AMBULATORY_CARE_PROVIDER_SITE_OTHER): Payer: Self-pay | Admitting: Family Medicine

## 2019-12-27 DIAGNOSIS — E559 Vitamin D deficiency, unspecified: Secondary | ICD-10-CM

## 2020-01-02 ENCOUNTER — Other Ambulatory Visit (INDEPENDENT_AMBULATORY_CARE_PROVIDER_SITE_OTHER): Payer: Self-pay | Admitting: Family Medicine

## 2020-01-02 DIAGNOSIS — I10 Essential (primary) hypertension: Secondary | ICD-10-CM

## 2020-01-04 ENCOUNTER — Ambulatory Visit (INDEPENDENT_AMBULATORY_CARE_PROVIDER_SITE_OTHER): Payer: 59 | Admitting: Family Medicine

## 2020-01-04 ENCOUNTER — Encounter (INDEPENDENT_AMBULATORY_CARE_PROVIDER_SITE_OTHER): Payer: Self-pay | Admitting: Family Medicine

## 2020-01-04 ENCOUNTER — Other Ambulatory Visit: Payer: Self-pay

## 2020-01-04 VITALS — BP 116/71 | HR 92 | Temp 97.8°F | Ht 66.0 in | Wt 269.0 lb

## 2020-01-04 DIAGNOSIS — I10 Essential (primary) hypertension: Secondary | ICD-10-CM | POA: Diagnosis not present

## 2020-01-04 DIAGNOSIS — E7849 Other hyperlipidemia: Secondary | ICD-10-CM | POA: Diagnosis not present

## 2020-01-04 DIAGNOSIS — Z9189 Other specified personal risk factors, not elsewhere classified: Secondary | ICD-10-CM | POA: Diagnosis not present

## 2020-01-04 DIAGNOSIS — Z6841 Body Mass Index (BMI) 40.0 and over, adult: Secondary | ICD-10-CM

## 2020-01-04 MED ORDER — TELMISARTAN 20 MG PO TABS: 20.0000 mg | ORAL_TABLET | Freq: Every day | ORAL | 0 refills | Status: DC

## 2020-01-04 NOTE — Progress Notes (Signed)
Chief Complaint:   OBESITY Tiffany Norton is here to discuss her progress with her obesity treatment plan along with follow-up of her obesity related diagnoses. Tiffany Norton is on the Category 4 Plan and states she is following her eating plan approximately 90% of the time. Tiffany Norton states she is exercising 0 minutes 0 times per week.  Today's visit was #: 8 Starting weight: 290 lbs Starting date: 09/08/2019 Today's weight: 269 lbs Today's date: 01/04/2020 Total lbs lost to date: 21 Total lbs lost since last in-office visit: 3  Interim History: Tiffany Norton is following the plan most of the time. She occasionally feels hungry if she goes too long between lunch and dinner. She reports getting in at least 8 oz at dinner. She anticipates no changes in the next few weeks in terms of food. Work is very busy at this time of there year. She is doing a  [employer] One bar for snacks.  Subjective:   Essential hypertension. Blood pressure is well controlled. No chest pain, chest pressure, or headache.   BP Readings from Last 3 Encounters:  01/04/20 116/71  12/16/19 140/78  11/29/19 123/82   Lab Results  Component Value Date   CREATININE 0.89 09/08/2019   Other hyperlipidemia. Tiffany Norton is on pravastatin. No myalgias.   Lab Results  Component Value Date   CHOL 153 09/08/2019   HDL 45 09/08/2019   LDLCALC 89 09/08/2019   TRIG 102 09/08/2019   Lab Results  Component Value Date   ALT 51 (H) 09/08/2019   AST 31 09/08/2019   ALKPHOS 167 (H) 09/08/2019   BILITOT 0.5 09/08/2019   The 10-year ASCVD risk score Denman George DC Jr., et al., 2013) is: 4.2%   Values used to calculate the score:     Age: 64 years     Sex: Female     Is Non-Hispanic African American: No     Diabetic: No     Tobacco smoker: No     Systolic Blood Pressure: 116 mmHg     Is BP treated: Yes     HDL Cholesterol: 45 mg/dL     Total Cholesterol: 153 mg/dL  At risk for heart disease. Tiffany Norton is at a higher than average risk  for cardiovascular disease due to obesity.   Assessment/Plan:   Essential hypertension. [name] is working on healthy weight loss and exercise to improve blood pressure control. We will watch for signs of hypotension as she continues her lifestyle modifications. Refill was given for telmisartan (MICARDIS) 20 MG tablet PO daily #30 with 0 refills.  Other hyperlipidemia. Cardiovascular risk and specific lipid/LDL goals reviewed.  We discussed several lifestyle modifications today and [name] will continue to work on diet, exercise and weight loss efforts. Orders and follow up as documented in patient record. She will continue her statin as directed. Will have labs at her next appointment.  Counseling Intensive lifestyle modifications are the first line treatment for this issue. . Dietary changes: Increase soluble fiber. Decrease simple carbohydrates. . Exercise changes: Moderate to vigorous-intensity aerobic activity 150 minutes per week if tolerated. . Lipid-lowering medications: see documented in medical record.  At risk for heart disease. Tiffany Norton was given approximately 15 minutes of coronary artery disease prevention counseling today. She is 64 y.o. female and has risk factors for heart disease including obesity. We discussed intensive lifestyle modifications today with an emphasis on specific weight loss instructions and strategies.   Repetitive spaced learning was employed today to elicit superior memory formation and  behavioral change.  Class 3 severe obesity with serious comorbidity and body mass index (BMI) of 40.0 to 44.9 in adult, unspecified obesity type ([place]).  Tiffany Norton is currently in the action stage of change. As such, her goal is to continue with weight loss efforts. She has agreed to the Category 4 Plan.   Exercise goals: For substantial health benefits, adults should do at least 150 minutes (2 hours and 30 minutes) a week of moderate-intensity, or 75 minutes (1 hour and 15  minutes) a week of vigorous-intensity aerobic physical activity, or an equivalent combination of moderate- and vigorous-intensity aerobic activity. Aerobic activity should be performed in episodes of at least 10 minutes, and preferably, it should be spread throughout the week.  Behavioral modification strategies: increasing lean protein intake, increasing vegetables, meal planning and cooking strategies, keeping healthy foods in the home and planning for success.  Tiffany Norton has agreed to follow-up with our clinic in 2 weeks. She was informed of the importance of frequent follow-up visits to maximize her success with intensive lifestyle modifications for her multiple health conditions.   Objective:   Blood pressure 116/71, pulse 92, temperature 97.8 F (36.6 C), temperature source Oral, height 5\' 6"  (1.676 m), weight 269 lb (122 kg), SpO2 96 %. Body mass index is 43.42 kg/m.  General: Cooperative, alert, well developed, in no acute distress. HEENT: Conjunctivae and lids unremarkable. Cardiovascular: Regular rhythm.  Lungs: Normal work of breathing. Neurologic: No focal deficits.   Lab Results  Component Value Date   CREATININE 0.89 09/08/2019   BUN 16 09/08/2019   NA 141 09/08/2019   K 5.0 09/08/2019   CL 104 09/08/2019   CO2 24 09/08/2019   Lab Results  Component Value Date   ALT 51 (H) 09/08/2019   AST 31 09/08/2019   ALKPHOS 167 (H) 09/08/2019   BILITOT 0.5 09/08/2019   Lab Results  Component Value Date   HGBA1C 5.8 (H) 09/08/2019   Lab Results  Component Value Date   INSULIN 21.1 09/08/2019   Lab Results  Component Value Date   TSH 1.570 09/08/2019   Lab Results  Component Value Date   CHOL 153 09/08/2019   HDL 45 09/08/2019   LDLCALC 89 09/08/2019   TRIG 102 09/08/2019   Lab Results  Component Value Date   WBC 10.1 09/08/2019   HGB 15.1 09/08/2019   HCT 43.8 09/08/2019   MCV 87 09/08/2019   PLT 320 09/08/2019   No results found for: IRON, TIBC,  FERRITIN  Attestation Statements:   Reviewed by clinician on day of visit: allergies, medications, problem list, medical history, surgical history, family history, social history, and previous encounter notes.  I, [name], am acting as transcriptionist for Coralie Common, MD   I have reviewed the above documentation for accuracy and completeness, and I agree with the above. - Jinny Blossom, MD

## 2020-01-21 ENCOUNTER — Other Ambulatory Visit (INDEPENDENT_AMBULATORY_CARE_PROVIDER_SITE_OTHER): Payer: Self-pay | Admitting: Family Medicine

## 2020-01-21 DIAGNOSIS — E559 Vitamin D deficiency, unspecified: Secondary | ICD-10-CM

## 2020-01-27 ENCOUNTER — Encounter (INDEPENDENT_AMBULATORY_CARE_PROVIDER_SITE_OTHER): Payer: Self-pay | Admitting: Family Medicine

## 2020-01-27 ENCOUNTER — Ambulatory Visit (INDEPENDENT_AMBULATORY_CARE_PROVIDER_SITE_OTHER): Payer: 59 | Admitting: Family Medicine

## 2020-01-27 ENCOUNTER — Other Ambulatory Visit: Payer: Self-pay

## 2020-01-27 ENCOUNTER — Other Ambulatory Visit (INDEPENDENT_AMBULATORY_CARE_PROVIDER_SITE_OTHER): Payer: Self-pay | Admitting: Family Medicine

## 2020-01-27 VITALS — BP 122/84 | HR 86 | Temp 98.0°F | Ht 66.0 in | Wt 265.0 lb

## 2020-01-27 DIAGNOSIS — E559 Vitamin D deficiency, unspecified: Secondary | ICD-10-CM | POA: Diagnosis not present

## 2020-01-27 DIAGNOSIS — R7303 Prediabetes: Secondary | ICD-10-CM | POA: Diagnosis not present

## 2020-01-27 DIAGNOSIS — Z9189 Other specified personal risk factors, not elsewhere classified: Secondary | ICD-10-CM | POA: Diagnosis not present

## 2020-01-27 DIAGNOSIS — I10 Essential (primary) hypertension: Secondary | ICD-10-CM | POA: Diagnosis not present

## 2020-01-27 DIAGNOSIS — E538 Deficiency of other specified B group vitamins: Secondary | ICD-10-CM

## 2020-01-27 DIAGNOSIS — E7849 Other hyperlipidemia: Secondary | ICD-10-CM

## 2020-01-27 DIAGNOSIS — Z6841 Body Mass Index (BMI) 40.0 and over, adult: Secondary | ICD-10-CM

## 2020-01-27 MED ORDER — VITAMIN D (ERGOCALCIFEROL) 1.25 MG (50000 UNIT) PO CAPS: 50000.0000 [IU] | ORAL_CAPSULE | ORAL | 0 refills | Status: DC

## 2020-01-27 MED ORDER — TELMISARTAN 20 MG PO TABS: 20.0000 mg | ORAL_TABLET | Freq: Every day | ORAL | 0 refills | Status: DC

## 2020-01-27 NOTE — Progress Notes (Signed)
Chief Complaint:   OBESITY Tiffany Norton is here to discuss her progress with her obesity treatment plan along with follow-up of her obesity related diagnoses. Tiffany Norton is on the Category 4 Plan and states she is following her eating plan approximately 90% of the time. Tiffany Norton states she is doing pool exercising for 60 minutes 2-3 times per week.  Today's visit was #: 9 Starting weight: 290 lbs Starting date: 09/08/2019 Today's weight: 265 lbs Today's date: 01/27/2020 Total lbs lost to date: 25 lbs Total lbs lost since last in-office visit: 4 lbs  Interim History: Tiffany Norton has started doing pool exercising as her community pool is open.  She has been adhering to the meal plan. She has had no issues getting all the food in.  For snacks, she has had Protein One bars and then has been adding extra calories into a meal.  Subjective:   1. Vitamin D deficiency Tiffany Norton's Vitamin D level was 23.9 on 09/08/2019. She is currently taking prescription vitamin D 50,000 IU each week. She denies nausea, vomiting or muscle weakness.  She endorses fatigue.  2. Essential hypertension Review: taking medications as instructed, no medication side effects noted, no chest pain on exertion, no dyspnea on exertion, no swelling of ankles.  Blood pressure is controlled today.  BP Readings from Last 3 Encounters:  01/27/20 122/84  01/04/20 116/71  12/16/19 140/78   3. Other hyperlipidemia Tiffany Norton has hyperlipidemia and has been trying to improve her cholesterol levels with intensive lifestyle modification including a low saturated fat diet, exercise and weight loss. She denies any chest pain, claudication or myalgias.  She is on a statin with some elevation of LFTs.  Lab Results  Component Value Date   ALT 51 (H) 09/08/2019   AST 31 09/08/2019   ALKPHOS 167 (H) 09/08/2019   BILITOT 0.5 09/08/2019   Lab Results  Component Value Date   CHOL 153 09/08/2019   HDL 45 09/08/2019   LDLCALC 89 09/08/2019   TRIG  102 09/08/2019   4. Prediabetes Tiffany Norton has a diagnosis of prediabetes based on her elevated HgA1c and was informed this puts her at greater risk of developing diabetes. She continues to work on diet and exercise to decrease her risk of diabetes. She denies nausea or hypoglycemia.  She is not on metformin.  Lab Results  Component Value Date   HGBA1C 5.8 (H) 09/08/2019   Lab Results  Component Value Date   INSULIN 21.1 09/08/2019   5. B12 deficiency She notes fatigue. She is not a vegetarian.  She does not have a previous diagnosis of pernicious anemia.  She does not have a history of weight loss surgery.  She is taking OTC vitamin B12.  Lab Results  Component Value Date   [id number] 9,381 (H) 09/08/2019   6. At risk for diabetes mellitus Tiffany Norton is at higher than average risk for developing diabetes due to her obesity.   Assessment/Plan:   1. Vitamin D deficiency Low Vitamin D level contributes to fatigue and are associated with obesity, breast, and colon cancer. She agrees to continue to take prescription Vitamin D @50 ,000 IU every week and will follow-up for routine testing of Vitamin D, at least 2-3 times per year to avoid over-replacement. - Vitamin D, Ergocalciferol, (DRISDOL) 1.25 MG (50000 UNIT) CAPS capsule; Take 1 capsule (50,000 Units total) by mouth every 7 (seven) days.  Dispense: 4 capsule; Refill: 0 - VITAMIN D 25 Hydroxy (Vit-D Deficiency, Fractures)  2. Essential hypertension Tiffany Norton is  working on healthy weight loss and exercise to improve blood pressure control. We will watch for signs of hypotension as she continues her lifestyle modifications. - telmisartan (MICARDIS) 20 MG tablet; Take 1 tablet (20 mg total) by mouth daily.  Dispense: 30 tablet; Refill: 0 - Comprehensive metabolic panel  3. Other hyperlipidemia Cardiovascular risk and specific lipid/LDL goals reviewed.  We discussed several lifestyle modifications today and Tiffany Norton will continue to work on  diet, exercise and weight loss efforts. Orders and follow up as documented in patient record.   Counseling Intensive lifestyle modifications are the first line treatment for this issue.  Dietary changes: Increase soluble fiber. Decrease simple carbohydrates.  Exercise changes: Moderate to vigorous-intensity aerobic activity 150 minutes per week if tolerated.  Lipid-lowering medications: see documented in medical record. - Lipid Panel With LDL/HDL Ratio  4. Prediabetes Tiffany Norton will continue to work on weight loss, exercise, and decreasing simple carbohydrates to help decrease the risk of diabetes.  - Hemoglobin A1c - Insulin, random  5. B12 deficiency The diagnosis was reviewed with the patient. Counseling provided today, see below. We will continue to monitor. Orders and follow up as documented in patient record.  Counseling  The body needs vitamin B12: to make red blood cells; to make DNA; and to help the nerves work properly so they can carry messages from the brain to the body.   The main causes of vitamin B12 deficiency include dietary deficiency, digestive diseases, pernicious anemia, and having a surgery in which part of the stomach or small intestine is removed.   Certain medicines can make it harder for the body to absorb vitamin B12. These medicines include: heartburn medications; some antibiotics; some medications used to treat diabetes, gout, and high cholesterol.   In some cases, there are no symptoms of this condition. If the condition leads to anemia or nerve damage, various symptoms can occur, such as weakness or fatigue, shortness of breath, and numbness or tingling in your hands and feet.    Treatment:  o May include taking vitamin B12 supplements.  o Avoid alcohol.  o Eat lots of healthy foods that contain vitamin B12: - Beef, pork, chicken, [place], and organ meats, such as liver.  - Seafood: This includes clams, rainbow trout, salmon, tuna, and haddock. Eggs.   - Cereal and dairy products that are fortified: This means that vitamin B12 has been added to the food.  - Vitamin B12  6. At risk for diabetes mellitus Tiffany Norton was given approximately 15 minutes of diabetes education and counseling today. We discussed intensive lifestyle modifications today with an emphasis on weight loss as well as increasing exercise and decreasing simple carbohydrates in her diet. We also reviewed medication options with an emphasis on risk versus benefit of those discussed.   Repetitive spaced learning was employed today to elicit superior memory formation and behavioral change.  7. Class 3 severe obesity with serious comorbidity and body mass index (BMI) of 40.0 to 44.9 in adult, unspecified obesity type (HCC) Tiffany Norton is currently in the action stage of change. As such, her goal is to continue with weight loss efforts. She has agreed to the Category 4 Plan.   Exercise goals: As is.  Behavioral modification strategies: increasing lean protein intake, meal planning and cooking strategies and keeping healthy foods in the home.  Tiffany Norton has agreed to follow-up with our clinic in 2.5 weeks. She was informed of the importance of frequent follow-up visits to maximize her success with intensive lifestyle modifications for  her multiple health conditions.   Tiffany Norton was informed we would discuss her lab results at her next visit unless there is a critical issue that needs to be addressed sooner. Tiffany Norton agreed to keep her next visit at the agreed upon time to discuss these results.  Objective:   Blood pressure 122/84, pulse 86, temperature 98 F (36.7 C), temperature source Oral, height 5\' 6"  (1.676 m), weight 265 lb (120.2 kg), SpO2 98 %. Body mass index is 42.77 kg/m.  General: Cooperative, alert, well developed, in no acute distress. HEENT: Conjunctivae and lids unremarkable. Cardiovascular: Regular rhythm.  Lungs: Normal work of breathing. Neurologic: No focal  deficits.   Lab Results  Component Value Date   CREATININE 0.89 09/08/2019   BUN 16 09/08/2019   NA 141 09/08/2019   K 5.0 09/08/2019   CL 104 09/08/2019   CO2 24 09/08/2019   Lab Results  Component Value Date   ALT 51 (H) 09/08/2019   AST 31 09/08/2019   ALKPHOS 167 (H) 09/08/2019   BILITOT 0.5 09/08/2019   Lab Results  Component Value Date   HGBA1C 5.8 (H) 09/08/2019   Lab Results  Component Value Date   INSULIN 21.1 09/08/2019   Lab Results  Component Value Date   TSH 1.570 09/08/2019   Lab Results  Component Value Date   CHOL 153 09/08/2019   HDL 45 09/08/2019   LDLCALC 89 09/08/2019   TRIG 102 09/08/2019   Lab Results  Component Value Date   WBC 10.1 09/08/2019   HGB 15.1 09/08/2019   HCT 43.8 09/08/2019   MCV 87 09/08/2019   PLT 320 09/08/2019   Attestation Statements:   Reviewed by clinician on day of visit: allergies, medications, problem list, medical history, surgical history, family history, social history, and previous encounter notes.  I, 09/10/2019, CMA, am acting as transcriptionist for Insurance claims handler, MD.  I have reviewed the above documentation for accuracy and completeness, and I agree with the above. - Reuben Likes, MD

## 2020-01-28 LAB — COMPREHENSIVE METABOLIC PANEL
ALT: 24 IU/L (ref 0–32)
AST: 14 IU/L (ref 0–40)
Albumin/Globulin Ratio: 2.1 (ref 1.2–2.2)
Albumin: 4.5 g/dL (ref 3.8–4.8)
Alkaline Phosphatase: 163 IU/L — ABNORMAL HIGH (ref 48–121)
BUN/Creatinine Ratio: 27 (ref 12–28)
BUN: 26 mg/dL (ref 8–27)
Bilirubin Total: 0.4 mg/dL (ref 0.0–1.2)
CO2: 23 mmol/L (ref 20–29)
Calcium: 10.4 mg/dL — ABNORMAL HIGH (ref 8.7–10.3)
Chloride: 105 mmol/L (ref 96–106)
Creatinine, Ser: 0.97 mg/dL (ref 0.57–1.00)
GFR calc Af Amer: 72 mL/min/{1.73_m2} (ref >59)
GFR calc non Af Amer: 63 mL/min/{1.73_m2} (ref >59)
Globulin, Total: 2.1 g/dL (ref 1.5–4.5)
Glucose: 111 mg/dL — ABNORMAL HIGH (ref 65–99)
Potassium: 5 mmol/L (ref 3.5–5.2)
Sodium: 143 mmol/L (ref 134–144)
Total Protein: 6.6 g/dL (ref 6.0–8.5)

## 2020-01-28 LAB — INSULIN, RANDOM: INSULIN: 22.2 u[IU]/mL (ref 2.6–24.9)

## 2020-01-28 LAB — LIPID PANEL WITH LDL/HDL RATIO
Cholesterol, Total: 174 mg/dL (ref 100–199)
HDL: 43 mg/dL (ref >39)
LDL Chol Calc (NIH): 112 mg/dL — ABNORMAL HIGH (ref 0–99)
LDL/HDL Ratio: 2.6 ratio (ref 0.0–3.2)
Triglycerides: 106 mg/dL (ref 0–149)
VLDL Cholesterol Cal: 19 mg/dL (ref 5–40)

## 2020-01-28 LAB — VITAMIN D 25 HYDROXY (VIT D DEFICIENCY, FRACTURES): Vit D, 25-Hydroxy: 40.9 ng/mL (ref 30.0–100.0)

## 2020-01-28 LAB — VITAMIN B12: Vitamin B-12: 1065 pg/mL (ref 232–1245)

## 2020-01-28 LAB — HEMOGLOBIN A1C
Est. average glucose Bld gHb Est-mCnc: 117 mg/dL
Hgb A1c MFr Bld: 5.7 % — ABNORMAL HIGH (ref 4.8–5.6)

## 2020-02-15 ENCOUNTER — Other Ambulatory Visit: Payer: Self-pay

## 2020-02-15 ENCOUNTER — Encounter (INDEPENDENT_AMBULATORY_CARE_PROVIDER_SITE_OTHER): Payer: Self-pay | Admitting: Adult Health

## 2020-02-15 ENCOUNTER — Ambulatory Visit (INDEPENDENT_AMBULATORY_CARE_PROVIDER_SITE_OTHER): Payer: 59 | Admitting: Adult Health

## 2020-02-15 VITALS — BP 114/78 | HR 97 | Temp 98.1°F | Ht 66.0 in | Wt 266.0 lb

## 2020-02-15 DIAGNOSIS — R7303 Prediabetes: Secondary | ICD-10-CM

## 2020-02-15 DIAGNOSIS — Z9189 Other specified personal risk factors, not elsewhere classified: Secondary | ICD-10-CM | POA: Diagnosis not present

## 2020-02-15 DIAGNOSIS — E559 Vitamin D deficiency, unspecified: Secondary | ICD-10-CM

## 2020-02-15 DIAGNOSIS — E7849 Other hyperlipidemia: Secondary | ICD-10-CM | POA: Diagnosis not present

## 2020-02-15 DIAGNOSIS — Z6841 Body Mass Index (BMI) 40.0 and over, adult: Secondary | ICD-10-CM

## 2020-02-15 DIAGNOSIS — E538 Deficiency of other specified B group vitamins: Secondary | ICD-10-CM

## 2020-02-15 MED ORDER — VITAMIN D (ERGOCALCIFEROL) 1.25 MG (50000 UNIT) PO CAPS: 50000.0000 [IU] | ORAL_CAPSULE | ORAL | 0 refills | Status: DC

## 2020-02-16 DIAGNOSIS — E538 Deficiency of other specified B group vitamins: Secondary | ICD-10-CM | POA: Insufficient documentation

## 2020-02-16 DIAGNOSIS — E559 Vitamin D deficiency, unspecified: Secondary | ICD-10-CM | POA: Insufficient documentation

## 2020-02-16 DIAGNOSIS — R7303 Prediabetes: Secondary | ICD-10-CM | POA: Insufficient documentation

## 2020-02-16 DIAGNOSIS — Z6841 Body Mass Index (BMI) 40.0 and over, adult: Secondary | ICD-10-CM | POA: Insufficient documentation

## 2020-02-16 NOTE — Progress Notes (Signed)
Chief Complaint:   OBESITY Tiffany Norton is here to discuss her progress with her obesity treatment plan along with follow-up of her obesity related diagnoses. Tiffany Norton is on the Category 4 Plan and states she is following her eating plan approximately 80% of the time. Tiffany Norton states she is exercising 0 minutes 0 times per week.  Today's visit was #: 10 Starting weight: 290 lbs Starting date: 09/08/2019 Today's weight: 266 lbs Today's date: 02/15/2020 Total lbs lost to date: 24 Total lbs lost since last in-office visit: 0  Interim History: Tiffany Norton feels that her hunger levels are well controlled throughout the day and denies polyphagia. She reports slight decrease in fatigue. She continues to experience bilateral knee pain due to osteoarthritis. Dr. Charlann Boxer of [facility] is following her for orthopedic care.  Subjective:   Vitamin D deficiency. Tiffany Norton is on prescription strength Vitamin D supplementation and tolerating it well. She denies nausea, vomiting, or muscle weakness.    Ref. Range 01/27/2020 16:34  Vitamin D, 25-Hydroxy Latest Ref Range: 30.0 - 100.0 ng/mL 40.9   Other hyperlipidemia. Tiffany Norton is on pravastatin 40 mg daily and denies myalgias.   Lab Results  Component Value Date   CHOL 174 01/27/2020   HDL 43 01/27/2020   LDLCALC 112 (H) 01/27/2020   TRIG 106 01/27/2020   Lab Results  Component Value Date   ALT 24 01/27/2020   AST 14 01/27/2020   ALKPHOS 163 (H) 01/27/2020   BILITOT 0.4 01/27/2020   The 10-year ASCVD risk score Denman George DC Jr., et al., 2013) is: 4.5%   Values used to calculate the score:     Age: 28 years     Sex: Female     Is Non-Hispanic African American: No     Diabetic: No     Tobacco smoker: No     Systolic Blood Pressure: 114 mmHg     Is BP treated: Yes     HDL Cholesterol: 43 mg/dL     Total Cholesterol: 174 mg/dL  Prediabetes. Tiffany Norton has a diagnosis of prediabetes based on her elevated HgA1c and was informed this puts her at  greater risk of developing diabetes. She continues to work on diet and exercise to decrease her risk of diabetes. She denies nausea or hypoglycemia. Blood glucose 111 on 01/27/2020. Tiffany Norton is not on metformin and denies polyphagia.  Lab Results  Component Value Date   HGBA1C 5.7 (H) 01/27/2020   Lab Results  Component Value Date   INSULIN 22.2 01/27/2020   INSULIN 21.1 09/08/2019   B12 deficiency. Tiffany Norton is on OTC B12 supplementation. She reports a slight decrease in fatigue.   Lab Results  Component Value Date   VITAMINB12 1,065 01/27/2020   At risk for heart disease. Tiffany Norton is at a higher than average risk for cardiovascular disease due to hypertension, hyperlipidemia, and obesity.   Assessment/Plan:   Vitamin D deficiency. Low Vitamin D level contributes to fatigue and are associated with obesity, breast, and colon cancer. She was given a refill on her Vitamin D, Ergocalciferol, (DRISDOL) 1.25 MG (50000 UNIT) CAPS capsule every week #4 with 0 refills and will follow-up for routine testing of Vitamin D every 3 months.   Other hyperlipidemia. Cardiovascular risk and specific lipid/LDL goals reviewed.  We discussed several lifestyle modifications today and Lacretia will continue to work on diet, exercise and weight loss efforts. Orders and follow up as documented in patient record. Tiffany Norton will continue statin therapy as directed. She will continue  to follow the Category 4 meal plan and remain as active as possible.  Counseling Intensive lifestyle modifications are the first line treatment for this issue. . Dietary changes: Increase soluble fiber. Decrease simple carbohydrates. . Exercise changes: Moderate to vigorous-intensity aerobic activity 150 minutes per week if tolerated. . Lipid-lowering medications: see documented in medical record.  Prediabetes. Tiffany Norton will continue to work on weight loss, exercise, and decreasing simple carbohydrates to help decrease the risk of diabetes.  She will continue the Category 4 meal plan. She was previously intolerant to metformin, i.e., GI upset.  B12 deficiency. The diagnosis was reviewed with the patient. Counseling provided today, see below. We will continue to monitor. Orders and follow up as documented in patient record. Tiffany Norton will continue to follow the Category 4 meal plan and OTC B12 supplementation.  Counseling . The body needs vitamin B12: to make red blood cells; to make DNA; and to help the nerves work properly so they can carry messages from the brain to the body.  . The main causes of vitamin B12 deficiency include dietary deficiency, digestive diseases, pernicious anemia, and having a surgery in which part of the stomach or small intestine is removed.  . Certain medicines can make it harder for the body to absorb vitamin B12. These medicines include: heartburn medications; some antibiotics; some medications used to treat diabetes, gout, and high cholesterol.  . In some cases, there are no symptoms of this condition. If the condition leads to anemia or nerve damage, various symptoms can occur, such as weakness or fatigue, shortness of breath, and numbness or tingling in your hands and feet.   . Treatment:  o May include taking vitamin B12 supplements.  o Avoid alcohol.  o Eat lots of healthy foods that contain vitamin B12: - Beef, pork, chicken, [place], and organ meats, such as liver.  - Seafood: This includes clams, rainbow trout, salmon, tuna, and haddock.  - Eggs.  - Cereal and dairy products that are fortified: This means that vitamin B12 has been added to the food.   At risk for heart disease. Tiffany Norton was given approximately 15 minutes of coronary artery disease prevention counseling today. She is 64 y.o. female and has risk factors for heart disease including obesity. We discussed intensive lifestyle modifications today with an emphasis on specific weight loss instructions and strategies.   Repetitive spaced  learning was employed today to elicit superior memory formation and behavioral change.  Class 3 severe obesity with serious comorbidity and body mass index (BMI) of 40.0 to 44.9 in adult, unspecified obesity type (HCC).  Breiona is currently in the action stage of change. As such, her goal is to continue with weight loss efforts. She has agreed to the Category 4 Plan.   Exercise goals: No exercise has been prescribed at this time.  Behavioral modification strategies: increasing lean protein intake, meal planning and cooking strategies, celebration eating strategies and planning for success.  Dreama has agreed to follow-up with our clinic in 2-3 weeks. She was informed of the importance of frequent follow-up visits to maximize her success with intensive lifestyle modifications for her multiple health conditions.   Objective:   Blood pressure 114/78, pulse 97, temperature 98.1 F (36.7 C), temperature source Oral, height 5\' 6"  (1.676 m), weight 266 lb (120.7 kg), SpO2 99 %. Body mass index is 42.93 kg/m.  General: Cooperative, alert, well developed, in no acute distress. HEENT: Conjunctivae and lids unremarkable. Cardiovascular: Regular rhythm.  Lungs: Normal work of breathing. Neurologic:  No focal deficits.   Lab Results  Component Value Date   CREATININE 0.97 01/27/2020   BUN 26 01/27/2020   NA 143 01/27/2020   K 5.0 01/27/2020   CL 105 01/27/2020   CO2 23 01/27/2020   Lab Results  Component Value Date   ALT 24 01/27/2020   AST 14 01/27/2020   ALKPHOS 163 (H) 01/27/2020   BILITOT 0.4 01/27/2020   Lab Results  Component Value Date   HGBA1C 5.7 (H) 01/27/2020   HGBA1C 5.8 (H) 09/08/2019   Lab Results  Component Value Date   INSULIN 22.2 01/27/2020   INSULIN 21.1 09/08/2019   Lab Results  Component Value Date   TSH 1.570 09/08/2019   Lab Results  Component Value Date   CHOL 174 01/27/2020   HDL 43 01/27/2020   LDLCALC 112 (H) 01/27/2020   TRIG 106 01/27/2020     Lab Results  Component Value Date   WBC 10.1 09/08/2019   HGB 15.1 09/08/2019   HCT 43.8 09/08/2019   MCV 87 09/08/2019   PLT 320 09/08/2019   No results found for: IRON, TIBC, FERRITIN  Attestation Statements:   Reviewed by clinician on day of visit: allergies, medications, problem list, medical history, surgical history, family history, social history, and previous encounter notes.  I, [name], am acting as Energy manager for [employer], NP-C   I have reviewed the above documentation for accuracy and completeness, and I agree with the above. -  Julaine Fusi, NP

## 2020-03-07 ENCOUNTER — Encounter (INDEPENDENT_AMBULATORY_CARE_PROVIDER_SITE_OTHER): Payer: Self-pay | Admitting: Family Medicine

## 2020-03-07 ENCOUNTER — Other Ambulatory Visit: Payer: Self-pay

## 2020-03-07 ENCOUNTER — Ambulatory Visit (INDEPENDENT_AMBULATORY_CARE_PROVIDER_SITE_OTHER): Payer: 59 | Admitting: Family Medicine

## 2020-03-07 VITALS — BP 105/74 | HR 85 | Temp 98.2°F | Ht 66.0 in | Wt 260.0 lb

## 2020-03-07 DIAGNOSIS — I10 Essential (primary) hypertension: Secondary | ICD-10-CM | POA: Diagnosis not present

## 2020-03-07 DIAGNOSIS — Z6841 Body Mass Index (BMI) 40.0 and over, adult: Secondary | ICD-10-CM

## 2020-03-07 DIAGNOSIS — E559 Vitamin D deficiency, unspecified: Secondary | ICD-10-CM

## 2020-03-07 MED ORDER — TELMISARTAN 20 MG PO TABS: 20.0000 mg | ORAL_TABLET | Freq: Every day | ORAL | 0 refills | Status: DC

## 2020-03-09 NOTE — Progress Notes (Signed)
Chief Complaint:   OBESITY Genni is here to discuss her progress with her obesity treatment plan along with follow-up of her obesity related diagnoses. Shaneen is on the Category 4 Plan and states she is following her eating plan approximately 90% of the time. Joliyah states she is in the pool for 2-3 hours 2 times per week.  Today's visit was #: 11 Starting weight: 290 lbs Starting date: 09/08/2019 Today's weight: 260 lbs Today's date: 03/07/2020 Total lbs lost to date: 30 Total lbs lost since last in-office visit: 6  Interim History: Matalie has been following the plan as strict as she can. She is going to go on vacation the week of August 16-20th. Next few weeks she wants to stick to the Category 4 plan. She is occasionally hungry but she is using snacks and extra calories to satiate.  Subjective:   1. Essential hypertension Katria's blood pressure is well controlled. She denies chest pain, chest pressure, or headaches.  2. Vitamin D deficiency Perian is on prescription Vit D, and she denies nausea, vomiting, or muscle weakness. She notes fatigue.  Assessment/Plan:   1. Essential hypertension Modelle is working on healthy weight loss and exercise to improve blood pressure control. We will watch for signs of hypotension as she continues her lifestyle modifications. We will refill telmisartan for 1 month.  - telmisartan (MICARDIS) 20 MG tablet; Take 1 tablet (20 mg total) by mouth daily.  Dispense: 30 tablet; Refill: 0  2. Vitamin D deficiency Low Vitamin D level contributes to fatigue and are associated with obesity, breast, and colon cancer. Natasja agreed to continue taking prescription Vitamin D 50,000 IU every week, no refill needed. She will follow-up for routine testing of Vitamin D, at least 2-3 times per year to avoid over-replacement.  3. Class 3 severe obesity with serious comorbidity and body mass index (BMI) of 40.0 to 44.9 in adult, unspecified obesity type  (HCC) Allesha is currently in the action stage of change. As such, her goal is to continue with weight loss efforts. She has agreed to the Category 4 Plan.   Exercise goals: All adults should avoid inactivity. Some physical activity is better than none, and adults who participate in any amount of physical activity gain some health benefits.  Behavioral modification strategies: increasing lean protein intake, meal planning and cooking strategies, keeping healthy foods in the home and planning for success.  Ketsia has agreed to follow-up with our clinic in 3 weeks. She was informed of the importance of frequent follow-up visits to maximize her success with intensive lifestyle modifications for her multiple health conditions.   Objective:   Blood pressure 105/74, pulse 85, temperature 98.2 F (36.8 C), temperature source Oral, height 5\' 6"  (1.676 m), weight 260 lb (117.9 kg), SpO2 97 %. Body mass index is 41.97 kg/m.  General: Cooperative, alert, well developed, in no acute distress. HEENT: Conjunctivae and lids unremarkable. Cardiovascular: Regular rhythm.  Lungs: Normal work of breathing. Neurologic: No focal deficits.   Lab Results  Component Value Date   CREATININE 0.97 01/27/2020   BUN 26 01/27/2020   NA 143 01/27/2020   K 5.0 01/27/2020   CL 105 01/27/2020   CO2 23 01/27/2020   Lab Results  Component Value Date   ALT 24 01/27/2020   AST 14 01/27/2020   ALKPHOS 163 (H) 01/27/2020   BILITOT 0.4 01/27/2020   Lab Results  Component Value Date   HGBA1C 5.7 (H) 01/27/2020   HGBA1C 5.8 (H)  09/08/2019   Lab Results  Component Value Date   INSULIN 22.2 01/27/2020   INSULIN 21.1 09/08/2019   Lab Results  Component Value Date   TSH 1.570 09/08/2019   Lab Results  Component Value Date   CHOL 174 01/27/2020   HDL 43 01/27/2020   LDLCALC 112 (H) 01/27/2020   TRIG 106 01/27/2020   Lab Results  Component Value Date   WBC 10.1 09/08/2019   HGB 15.1 09/08/2019   HCT  43.8 09/08/2019   MCV 87 09/08/2019   PLT 320 09/08/2019   No results found for: IRON, TIBC, FERRITIN  Obesity Behavioral Intervention Documentation for Insurance:   Approximately 15 minutes were spent on the discussion below.  ASK: We discussed the diagnosis of obesity with Alera today and Makenley agreed to give [place] permission to discuss obesity behavioral modification therapy today.  ASSESS: Danahi has the diagnosis of obesity and her BMI today is 41.99. Oretha is in the action stage of change.   ADVISE: Storey was educated on the multiple health risks of obesity as well as the benefit of weight loss to improve her health. She was advised of the need for long term treatment and the importance of lifestyle modifications to improve her current health and to decrease her risk of future health problems.  AGREE: Multiple dietary modification options and treatment options were discussed and Sharnee agreed to follow the recommendations documented in the above note.  ARRANGE: Jullie was educated on the importance of frequent visits to treat obesity as outlined per CMS and USPSTF guidelines and agreed to schedule her next follow up appointment today.  Attestation Statements:   Reviewed by clinician on day of visit: allergies, medications, problem list, medical history, surgical history, family history, social history, and previous encounter notes.   I, Burt Knack, am acting as transcriptionist for Reuben Likes, MD. I have reviewed the above documentation for accuracy and completeness, and I agree with the above. - Katherina Mires, MD

## 2020-03-14 ENCOUNTER — Other Ambulatory Visit (INDEPENDENT_AMBULATORY_CARE_PROVIDER_SITE_OTHER): Payer: Self-pay | Admitting: Adult Health

## 2020-03-14 DIAGNOSIS — E559 Vitamin D deficiency, unspecified: Secondary | ICD-10-CM

## 2020-03-28 ENCOUNTER — Ambulatory Visit (INDEPENDENT_AMBULATORY_CARE_PROVIDER_SITE_OTHER): Payer: 59 | Admitting: Family Medicine

## 2020-03-28 ENCOUNTER — Encounter (INDEPENDENT_AMBULATORY_CARE_PROVIDER_SITE_OTHER): Payer: Self-pay | Admitting: Family Medicine

## 2020-03-28 ENCOUNTER — Other Ambulatory Visit: Payer: Self-pay

## 2020-03-28 VITALS — BP 120/76 | HR 91 | Temp 97.7°F | Ht 66.0 in | Wt 259.0 lb

## 2020-03-28 DIAGNOSIS — Z9189 Other specified personal risk factors, not elsewhere classified: Secondary | ICD-10-CM

## 2020-03-28 DIAGNOSIS — E559 Vitamin D deficiency, unspecified: Secondary | ICD-10-CM

## 2020-03-28 DIAGNOSIS — Z6841 Body Mass Index (BMI) 40.0 and over, adult: Secondary | ICD-10-CM

## 2020-03-28 DIAGNOSIS — M170 Bilateral primary osteoarthritis of knee: Secondary | ICD-10-CM

## 2020-03-28 MED ORDER — VITAMIN D (ERGOCALCIFEROL) 1.25 MG (50000 UNIT) PO CAPS: 50000.0000 [IU] | ORAL_CAPSULE | ORAL | 0 refills | Status: DC

## 2020-03-29 NOTE — Progress Notes (Signed)
Chief Complaint:   OBESITY Tiffany Norton is here to discuss her progress with her obesity treatment plan along with follow-up of her obesity related diagnoses. Tiffany Norton is on the Category 4 Plan and states she is following her eating plan approximately 85% of the time. Tiffany Norton states she is doing 0 minutes 0 times per week.  Today's visit was #: 12 Starting weight: 290 lbs Starting date: 09/08/2019 Today's weight: 259 lbs Today's date: 03/28/2020 Total lbs lost to date: 31 Total lbs lost since last in-office visit: 1  Interim History: Tiffany Norton voices the last two weeks she tried to stay on the plan but occasionally splurges. She does go out for [place] food on Tuesdays and will occasionally opt out of [employer] . She is leaving for 2 weeks at the beach in 5 days. She wants to eat in for most meals while away. She does report she will likely indulge in some alcohol while vacationing.  Subjective:   1. Vitamin D deficiency Tiffany Norton denies nausea, vomiting, or muscle weakness, but she notes fatigue.  2. Osteoarthritis of both knees, unspecified osteoarthritis type Tiffany Norton has a goal to see the Orthopedist after her beach trip to discuss surgery. She will need bilateral replacement.  3. At risk for osteoporosis Tiffany Norton is at higher risk of osteopenia and osteoporosis due to Vitamin D deficiency.   Assessment/Plan:   1. Vitamin D deficiency Low Vitamin D level contributes to fatigue and are associated with obesity, breast, and colon cancer. We will refill prescription Vitamin D for 1 month. Tamsyn will follow-up for routine testing of Vitamin D, at least 2-3 times per year to avoid over-replacement.  - Vitamin D, Ergocalciferol, (DRISDOL) 1.25 MG (50000 UNIT) CAPS capsule; Take 1 capsule (50,000 Units total) by mouth every 7 (seven) days.  Dispense: 4 capsule; Refill: 0  2. Osteoarthritis of both knees, unspecified osteoarthritis type We will follow up at [facility] next  appointment.  3. At risk for osteoporosis Tiffany Norton was given approximately 15 minutes of osteoporosis prevention counseling today. Tiffany Norton is at risk for osteopenia and osteoporosis due to her Vitamin D deficiency. She was encouraged to take her Vitamin D and follow her higher calcium diet and increase strengthening exercise to help strengthen her bones and decrease her risk of osteopenia and osteoporosis.  Repetitive spaced learning was employed today to elicit superior memory formation and behavioral change.  4. Class 3 severe obesity with serious comorbidity and body mass index (BMI) of 40.0 to 44.9 in adult, unspecified obesity type (HCC) Tiffany Norton is currently in the action stage of change. As such, her goal is to continue with weight loss efforts. She has agreed to the Category 4 Plan.   Exercise goals: All adults should avoid inactivity. Some physical activity is better than none, and adults who participate in any amount of physical activity gain some health benefits.  Behavioral modification strategies: increasing lean protein intake, increasing vegetables, meal planning and cooking strategies, keeping healthy foods in the home and planning for success.  Callie has agreed to follow-up with our clinic in 2 to 3 weeks. She was informed of the importance of frequent follow-up visits to maximize her success with intensive lifestyle modifications for her multiple health conditions.   Objective:   Blood pressure 120/76, pulse 91, temperature 97.7 F (36.5 C), temperature source Oral, height 5\' 6"  (1.676 m), weight 259 lb (117.5 kg), SpO2 99 %. Body mass index is 41.8 kg/m.  General: Cooperative, alert, well developed, in no acute distress. HEENT:  Conjunctivae and lids unremarkable. Cardiovascular: Regular rhythm.  Lungs: Normal work of breathing. Neurologic: No focal deficits.   Lab Results  Component Value Date   CREATININE 0.97 01/27/2020   BUN 26 01/27/2020   NA 143 01/27/2020    K 5.0 01/27/2020   CL 105 01/27/2020   CO2 23 01/27/2020   Lab Results  Component Value Date   ALT 24 01/27/2020   AST 14 01/27/2020   ALKPHOS 163 (H) 01/27/2020   BILITOT 0.4 01/27/2020   Lab Results  Component Value Date   HGBA1C 5.7 (H) 01/27/2020   HGBA1C 5.8 (H) 09/08/2019   Lab Results  Component Value Date   INSULIN 22.2 01/27/2020   INSULIN 21.1 09/08/2019   Lab Results  Component Value Date   TSH 1.570 09/08/2019   Lab Results  Component Value Date   CHOL 174 01/27/2020   HDL 43 01/27/2020   LDLCALC 112 (H) 01/27/2020   TRIG 106 01/27/2020   Lab Results  Component Value Date   WBC 10.1 09/08/2019   HGB 15.1 09/08/2019   HCT 43.8 09/08/2019   MCV 87 09/08/2019   PLT 320 09/08/2019   No results found for: IRON, TIBC, FERRITIN  Attestation Statements:   Reviewed by clinician on day of visit: allergies, medications, problem list, medical history, surgical history, family history, social history, and previous encounter notes.   I, Burt Knack, am acting as transcriptionist for Reuben Likes, MD.  I have reviewed the above documentation for accuracy and completeness, and I agree with the above. - Katherina Mires, MD

## 2020-04-16 ENCOUNTER — Other Ambulatory Visit (INDEPENDENT_AMBULATORY_CARE_PROVIDER_SITE_OTHER): Payer: Self-pay | Admitting: Family Medicine

## 2020-04-16 DIAGNOSIS — I10 Essential (primary) hypertension: Secondary | ICD-10-CM

## 2020-04-19 ENCOUNTER — Encounter (INDEPENDENT_AMBULATORY_CARE_PROVIDER_SITE_OTHER): Payer: Self-pay | Admitting: Family Medicine

## 2020-04-19 ENCOUNTER — Ambulatory Visit (INDEPENDENT_AMBULATORY_CARE_PROVIDER_SITE_OTHER): Payer: 59 | Admitting: Family Medicine

## 2020-04-19 ENCOUNTER — Other Ambulatory Visit: Payer: Self-pay

## 2020-04-19 VITALS — BP 109/75 | HR 103 | Temp 98.0°F | Ht 66.0 in | Wt 259.0 lb

## 2020-04-19 DIAGNOSIS — Z9189 Other specified personal risk factors, not elsewhere classified: Secondary | ICD-10-CM

## 2020-04-19 DIAGNOSIS — R7303 Prediabetes: Secondary | ICD-10-CM | POA: Diagnosis not present

## 2020-04-19 DIAGNOSIS — Z6841 Body Mass Index (BMI) 40.0 and over, adult: Secondary | ICD-10-CM

## 2020-04-19 DIAGNOSIS — I10 Essential (primary) hypertension: Secondary | ICD-10-CM | POA: Diagnosis not present

## 2020-04-19 DIAGNOSIS — E559 Vitamin D deficiency, unspecified: Secondary | ICD-10-CM

## 2020-04-19 MED ORDER — VITAMIN D (ERGOCALCIFEROL) 1.25 MG (50000 UNIT) PO CAPS: 50000.0000 [IU] | ORAL_CAPSULE | ORAL | 0 refills | Status: DC

## 2020-04-20 NOTE — Progress Notes (Signed)
Chief Complaint:   OBESITY Tiffany Norton is here to discuss her progress with her obesity treatment plan along with follow-up of her obesity related diagnoses. Tiffany Norton is on the Category 4 Plan and states she is following her eating plan approximately 80% of the time. Tiffany Norton states she is doing 0 minutes 0 times per week.  Today's visit was #: 13 Starting weight: 290 lbs Starting date: 09/08/2019 Today's weight: 259 lbs Today's date: 04/19/2020 Total lbs lost to date: 31 Total lbs lost since last in-office visit: 0  Interim History: Tiffany Norton has returned to the clinic after vacation. She did go out to eat a few times as she ans her family wanted seafood. She voices she ate more fried food than normal. She got back on track since returning home. She hasn't made a follow up with Ortho yet.  Subjective:   1. Essential hypertension Tiffany Norton's blood pressure is controlled today. She denies chest pain, chest pressure, or headache.  2. Vitamin D deficiency Tiffany Norton denies nausea, vomiting, or muscle weakness, but she notes fatigue. Last Vit D level was of 40.9.  3. Pre-diabetes Tiffany Norton's last A1c was 5.7 and insulin 22.2. She is not on medications and she notes occasional cravings for carbohydrates.  4. At risk for osteoporosis Tiffany Norton is at higher risk of osteopenia and osteoporosis due to Vitamin D deficiency.   Assessment/Plan:   1. Essential hypertension Tiffany Norton is working on healthy weight loss and exercise to improve blood pressure control. We will watch for signs of hypotension as she continues her lifestyle modifications. We will follow up on her blood pressure at her next appointment, if blood pressure is controlled again then we will decrease Micardis.  2. Vitamin D deficiency Low Vitamin D level contributes to fatigue and are associated with obesity, breast, and colon cancer. We will refill prescription Vitamin D for 1 month. Tiffany Norton will follow-up for routine testing of Vitamin  D, at least 2-3 times per year to avoid over-replacement.  - Vitamin D, Ergocalciferol, (DRISDOL) 1.25 MG (50000 UNIT) CAPS capsule; Take 1 capsule (50,000 Units total) by mouth every 7 (seven) days.  Dispense: 4 capsule; Refill: 0  3. Pre-diabetes Tiffany Norton will continue to work on weight loss, exercise, and decreasing simple carbohydrates to help decrease the risk of diabetes. We will repeat labs in 1.5 months.   4. At risk for osteoporosis Tiffany Norton was given approximately 15 minutes of osteoporosis prevention counseling today. Tiffany Norton is at risk for osteopenia and osteoporosis due to her Vitamin D deficiency. She was encouraged to take her Vitamin D and follow her higher calcium diet and increase strengthening exercise to help strengthen her bones and decrease her risk of osteopenia and osteoporosis.  Repetitive spaced learning was employed today to elicit superior memory formation and behavioral change.  5. Class 3 severe obesity with serious comorbidity and body mass index (BMI) of 40.0 to 44.9 in adult, unspecified obesity type (HCC) Tiffany Norton is currently in the action stage of change. As such, her goal is to continue with weight loss efforts. She has agreed to the Category 4 Plan.   Exercise goals: No exercise has been prescribed at this time.  Behavioral modification strategies: increasing lean protein intake, increasing vegetables, meal planning and cooking strategies, keeping healthy foods in the home and planning for success.  Tiffany Norton has agreed to follow-up with our clinic in 2 to 3 weeks. She was informed of the importance of frequent follow-up visits to maximize her success with intensive lifestyle modifications for  her multiple health conditions.   Objective:   Blood pressure 109/75, pulse (!) 103, temperature 98 F (36.7 C), temperature source Oral, height 5\' 6"  (1.676 m), weight 259 lb (117.5 kg), SpO2 97 %. Body mass index is 41.8 kg/m.  General: Cooperative, alert, well  developed, in no acute distress. HEENT: Conjunctivae and lids unremarkable. Cardiovascular: Regular rhythm.  Lungs: Normal work of breathing. Neurologic: No focal deficits.   Lab Results  Component Value Date   CREATININE 0.97 01/27/2020   BUN 26 01/27/2020   NA 143 01/27/2020   K 5.0 01/27/2020   CL 105 01/27/2020   CO2 23 01/27/2020   Lab Results  Component Value Date   ALT 24 01/27/2020   AST 14 01/27/2020   ALKPHOS 163 (H) 01/27/2020   BILITOT 0.4 01/27/2020   Lab Results  Component Value Date   HGBA1C 5.7 (H) 01/27/2020   HGBA1C 5.8 (H) 09/08/2019   Lab Results  Component Value Date   INSULIN 22.2 01/27/2020   INSULIN 21.1 09/08/2019   Lab Results  Component Value Date   TSH 1.570 09/08/2019   Lab Results  Component Value Date   CHOL 174 01/27/2020   HDL 43 01/27/2020   LDLCALC 112 (H) 01/27/2020   TRIG 106 01/27/2020   Lab Results  Component Value Date   WBC 10.1 09/08/2019   HGB 15.1 09/08/2019   HCT 43.8 09/08/2019   MCV 87 09/08/2019   PLT 320 09/08/2019   No results found for: IRON, TIBC, FERRITIN  Attestation Statements:   Reviewed by clinician on day of visit: allergies, medications, problem list, medical history, surgical history, family history, social history, and previous encounter notes.   I, 09/10/2019, am acting as transcriptionist for Burt Knack, MD.  I have reviewed the above documentation for accuracy and completeness, and I agree with the above. - Reuben Likes, MD

## 2020-04-21 ENCOUNTER — Other Ambulatory Visit (INDEPENDENT_AMBULATORY_CARE_PROVIDER_SITE_OTHER): Payer: Self-pay | Admitting: Family Medicine

## 2020-04-21 DIAGNOSIS — E559 Vitamin D deficiency, unspecified: Secondary | ICD-10-CM

## 2020-05-02 ENCOUNTER — Other Ambulatory Visit (INDEPENDENT_AMBULATORY_CARE_PROVIDER_SITE_OTHER): Payer: Self-pay

## 2020-05-02 ENCOUNTER — Encounter (INDEPENDENT_AMBULATORY_CARE_PROVIDER_SITE_OTHER): Payer: Self-pay | Admitting: Family Medicine

## 2020-05-02 DIAGNOSIS — I10 Essential (primary) hypertension: Secondary | ICD-10-CM

## 2020-05-02 MED ORDER — TELMISARTAN 20 MG PO TABS: 20.0000 mg | ORAL_TABLET | Freq: Every day | ORAL | 0 refills | Status: DC

## 2020-05-10 ENCOUNTER — Encounter (INDEPENDENT_AMBULATORY_CARE_PROVIDER_SITE_OTHER): Payer: Self-pay | Admitting: Family Medicine

## 2020-05-10 ENCOUNTER — Ambulatory Visit (INDEPENDENT_AMBULATORY_CARE_PROVIDER_SITE_OTHER): Payer: 59 | Admitting: Family Medicine

## 2020-05-10 ENCOUNTER — Other Ambulatory Visit: Payer: Self-pay

## 2020-05-10 VITALS — BP 117/76 | HR 89 | Temp 98.2°F | Ht 66.0 in | Wt 258.0 lb

## 2020-05-10 DIAGNOSIS — R7303 Prediabetes: Secondary | ICD-10-CM

## 2020-05-10 DIAGNOSIS — Z6841 Body Mass Index (BMI) 40.0 and over, adult: Secondary | ICD-10-CM | POA: Diagnosis not present

## 2020-05-10 DIAGNOSIS — E7849 Other hyperlipidemia: Secondary | ICD-10-CM | POA: Diagnosis not present

## 2020-05-14 NOTE — Progress Notes (Signed)
Chief Complaint:   OBESITY Tiffany Norton is here to discuss her progress with her obesity treatment plan along with follow-up of her obesity related diagnoses. Tiffany Norton is on the Category 4 Plan and states she is following her eating plan approximately 80% of the time. Tiffany Norton states she is doing 0 minutes 0 times per week.  Today's visit was #: 14 Starting weight: 290 lbs Starting date: 09/08/2019 Today's weight: 258 lbs Today's date: 05/10/2020 Total lbs lost to date: 32 Total lbs lost since last in-office visit: 1  Interim History: Tiffany Norton has had a good few weeks but last week she felt hungry all the time. She didn't eat even if she was hungry. She is getting 4-6 oz of meat at dinner. She is doing protein bar for snack calories, and doing creamer in her coffee. She made an appointment on October 8th with Orthopedic surgery.  Subjective:   1. Pre-diabetes Tiffany Norton's last A1c was 5.7 and insulin 22.2. She is not on medications. Last labs were in early June.  2. Other hyperlipidemia Tiffany Norton is on Pravachol. Last LDL was 112, HDL 43, and triglycerides 354. She denies myalgias or transaminitis.  Assessment/Plan:   1. Pre-diabetes Tiffany Norton will continue to work on weight loss, exercise, and decreasing simple carbohydrates to help decrease the risk of diabetes. We will repeat labs at her next appointment.  2. Other hyperlipidemia Cardiovascular risk and specific lipid/LDL goals reviewed. We discussed several lifestyle modifications today and Tiffany Norton will continue to work on diet, exercise and weight loss efforts. We will repeat labs at her next appointment. Orders and follow up as documented in patient record.   Counseling Intensive lifestyle modifications are the first line treatment for this issue. . Dietary changes: Increase soluble fiber. Decrease simple carbohydrates. . Exercise changes: Moderate to vigorous-intensity aerobic activity 150 minutes per week if  tolerated. . Lipid-lowering medications: see documented in medical record.  3. Class 3 severe obesity with serious comorbidity and body mass index (BMI) of 40.0 to 44.9 in adult, unspecified obesity type (HCC) Tiffany Norton is currently in the action stage of change. As such, her goal is to continue with weight loss efforts. She has agreed to the Category 4 Plan.   Exercise goals: All adults should avoid inactivity. Some physical activity is better than none, and adults who participate in any amount of physical activity gain some health benefits.  Behavioral modification strategies: increasing lean protein intake, meal planning and cooking strategies, keeping healthy foods in the home and planning for success.  Tiffany Norton has agreed to follow-up with our clinic in 2 weeks. She was informed of the importance of frequent follow-up visits to maximize her success with intensive lifestyle modifications for her multiple health conditions.   Objective:   Blood pressure 117/76, pulse 89, temperature 98.2 F (36.8 C), temperature source Oral, height 5\' 6"  (1.676 m), weight 258 lb (117 kg), SpO2 99 %. Body mass index is 41.64 kg/m.  General: Cooperative, alert, well developed, in no acute distress. HEENT: Conjunctivae and lids unremarkable. Cardiovascular: Regular rhythm.  Lungs: Normal work of breathing. Neurologic: No focal deficits.   Lab Results  Component Value Date   CREATININE 0.97 01/27/2020   BUN 26 01/27/2020   NA 143 01/27/2020   K 5.0 01/27/2020   CL 105 01/27/2020   CO2 23 01/27/2020   Lab Results  Component Value Date   ALT 24 01/27/2020   AST 14 01/27/2020   ALKPHOS 163 (H) 01/27/2020   BILITOT 0.4 01/27/2020  Lab Results  Component Value Date   HGBA1C 5.7 (H) 01/27/2020   HGBA1C 5.8 (H) 09/08/2019   Lab Results  Component Value Date   INSULIN 22.2 01/27/2020   INSULIN 21.1 09/08/2019   Lab Results  Component Value Date   TSH 1.570 09/08/2019   Lab Results   Component Value Date   CHOL 174 01/27/2020   HDL 43 01/27/2020   LDLCALC 112 (H) 01/27/2020   TRIG 106 01/27/2020   Lab Results  Component Value Date   WBC 10.1 09/08/2019   HGB 15.1 09/08/2019   HCT 43.8 09/08/2019   MCV 87 09/08/2019   PLT 320 09/08/2019   No results found for: IRON, TIBC, FERRITIN  Attestation Statements:   Reviewed by clinician on day of visit: allergies, medications, problem list, medical history, surgical history, family history, social history, and previous encounter notes.  Time spent on visit including pre-visit chart review and post-visit care and charting was 16 minutes.    I, Burt Knack, am acting as transcriptionist for Reuben Likes, MD.  I have reviewed the above documentation for accuracy and completeness, and I agree with the above. - Katherina Mires, MD

## 2020-05-24 ENCOUNTER — Ambulatory Visit (INDEPENDENT_AMBULATORY_CARE_PROVIDER_SITE_OTHER): Payer: 59 | Admitting: Family Medicine

## 2020-05-24 ENCOUNTER — Encounter (INDEPENDENT_AMBULATORY_CARE_PROVIDER_SITE_OTHER): Payer: Self-pay | Admitting: Family Medicine

## 2020-05-24 VITALS — BP 108/75 | HR 93 | Temp 98.9°F | Ht 66.0 in | Wt 254.0 lb

## 2020-05-24 DIAGNOSIS — Z6841 Body Mass Index (BMI) 40.0 and over, adult: Secondary | ICD-10-CM | POA: Diagnosis not present

## 2020-05-24 DIAGNOSIS — I10 Essential (primary) hypertension: Secondary | ICD-10-CM | POA: Diagnosis not present

## 2020-05-24 DIAGNOSIS — Z9189 Other specified personal risk factors, not elsewhere classified: Secondary | ICD-10-CM

## 2020-05-24 DIAGNOSIS — E559 Vitamin D deficiency, unspecified: Secondary | ICD-10-CM | POA: Diagnosis not present

## 2020-05-24 MED ORDER — VITAMIN D (ERGOCALCIFEROL) 1.25 MG (50000 UNIT) PO CAPS: 50000.0000 [IU] | ORAL_CAPSULE | ORAL | 0 refills | Status: DC

## 2020-05-24 MED ORDER — TELMISARTAN 20 MG PO TABS: 20.0000 mg | ORAL_TABLET | Freq: Every day | ORAL | 0 refills | Status: DC

## 2020-05-24 NOTE — Progress Notes (Signed)
Chief Complaint:   OBESITY Tiffany Norton is here to discuss her progress with her obesity treatment plan along with follow-up of her obesity related diagnoses. Tiffany Norton is on the Category 4 Plan and states she is following her eating plan approximately 85% of the time. Tiffany Norton states she is walking the dog for 15-20 minutes 7 times per week.  Today's visit was #: 15 Starting weight: 290 lbs Starting date: 09/08/2019 Today's weight: 254 lbs Today's date: 05/24/2020 Total lbs lost to date: 36 Total lbs lost since last in-office visit: 4  Interim History: Tiffany Norton voices in the last few weeks she adopted a poodle mix puppy, and so her time has been spent with the puppy. She has an Ortho appointment in 2 days. She denies hunger. She has been mindful of getting all of the protein in. She has been eating at home more in the last few weeks.  Subjective:   1. Essential hypertension Tiffany Norton's blood pressure is well controlled today. She is on telmisartan, and she denies chest pain, chest pressure, or headache.  2. Vitamin D deficiency Tiffany Norton denies nausea, vomiting, or muscle weakness, but she notes fatigue.  3. At risk for complication associated with hypotension Tiffany Norton is at a higher than average risk of hypotension due to blood pressure borderline low today. She is on blood pressure medications.  Assessment/Plan:   1. Essential hypertension Tiffany Norton is working on healthy weight loss and exercise to improve blood pressure control. We will watch for signs of hypotension as she continues her lifestyle modifications. We will refill Micardis 20 mg PO daily.  - telmisartan (MICARDIS) 20 MG tablet; Take 1 tablet (20 mg total) by mouth daily.  Dispense: 30 tablet; Refill: 0  2. Vitamin D deficiency Low Vitamin D level contributes to fatigue and are associated with obesity, breast, and colon cancer. We will refill prescription Vitamin D for 1 month. Tiffany Norton will follow-up for routine testing of  Vitamin D, at least 2-3 times per year to avoid over-replacement.  - Vitamin D, Ergocalciferol, (DRISDOL) 1.25 MG (50000 UNIT) CAPS capsule; Take 1 capsule (50,000 Units total) by mouth every 7 (seven) days.  Dispense: 4 capsule; Refill: 0  3. At risk for complication associated with hypotension Tiffany Norton was given approximately 15 minutes of education and counseling today to help avoid hypotension. We discussed risks of hypotension with weight loss and signs of hypotension such as feeling lightheaded or unsteady.  Repetitive spaced learning was employed today to elicit superior memory formation and behavioral change.  4. Class 3 severe obesity with serious comorbidity and body mass index (BMI) of 40.0 to 44.9 in adult, unspecified obesity type (HCC) Tiffany Norton is currently in the action stage of change. As such, her goal is to continue with weight loss efforts. She has agreed to the Category 4 Plan.   Exercise goals: All adults should avoid inactivity. Some physical activity is better than none, and adults who participate in any amount of physical activity gain some health benefits.  Behavioral modification strategies: increasing lean protein intake, meal planning and cooking strategies, keeping healthy foods in the home and planning for success.  Tiffany Norton has agreed to follow-up with our clinic in 3 weeks. She was informed of the importance of frequent follow-up visits to maximize her success with intensive lifestyle modifications for her multiple health conditions.   Objective:   Blood pressure 108/75, pulse 93, temperature 98.9 F (37.2 C), temperature source Oral, height 5\' 6"  (1.676 m), weight 254 lb (115.2 kg), SpO2  97 %. Body mass index is 41 kg/m.  General: Cooperative, alert, well developed, in no acute distress. HEENT: Conjunctivae and lids unremarkable. Cardiovascular: Regular rhythm.  Lungs: Normal work of breathing. Neurologic: No focal deficits.   Lab Results  Component Value  Date   CREATININE 0.97 01/27/2020   BUN 26 01/27/2020   NA 143 01/27/2020   K 5.0 01/27/2020   CL 105 01/27/2020   CO2 23 01/27/2020   Lab Results  Component Value Date   ALT 24 01/27/2020   AST 14 01/27/2020   ALKPHOS 163 (H) 01/27/2020   BILITOT 0.4 01/27/2020   Lab Results  Component Value Date   HGBA1C 5.7 (H) 01/27/2020   HGBA1C 5.8 (H) 09/08/2019   Lab Results  Component Value Date   INSULIN 22.2 01/27/2020   INSULIN 21.1 09/08/2019   Lab Results  Component Value Date   TSH 1.570 09/08/2019   Lab Results  Component Value Date   CHOL 174 01/27/2020   HDL 43 01/27/2020   LDLCALC 112 (H) 01/27/2020   TRIG 106 01/27/2020   Lab Results  Component Value Date   WBC 10.1 09/08/2019   HGB 15.1 09/08/2019   HCT 43.8 09/08/2019   MCV 87 09/08/2019   PLT 320 09/08/2019   No results found for: IRON, TIBC, FERRITIN  Attestation Statements:   Reviewed by clinician on day of visit: allergies, medications, problem list, medical history, surgical history, family history, social history, and previous encounter notes.   I, Burt Knack, am acting as transcriptionist for Reuben Likes, MD. I have reviewed the above documentation for accuracy and completeness, and I agree with the above. - Katherina Mires, MD

## 2020-05-28 ENCOUNTER — Other Ambulatory Visit (INDEPENDENT_AMBULATORY_CARE_PROVIDER_SITE_OTHER): Payer: Self-pay | Admitting: Family Medicine

## 2020-05-28 DIAGNOSIS — I10 Essential (primary) hypertension: Secondary | ICD-10-CM

## 2020-06-13 ENCOUNTER — Other Ambulatory Visit (INDEPENDENT_AMBULATORY_CARE_PROVIDER_SITE_OTHER): Payer: Self-pay | Admitting: Family Medicine

## 2020-06-13 DIAGNOSIS — E559 Vitamin D deficiency, unspecified: Secondary | ICD-10-CM

## 2020-06-14 ENCOUNTER — Encounter (INDEPENDENT_AMBULATORY_CARE_PROVIDER_SITE_OTHER): Payer: Self-pay | Admitting: Family Medicine

## 2020-06-14 ENCOUNTER — Other Ambulatory Visit: Payer: Self-pay

## 2020-06-14 ENCOUNTER — Ambulatory Visit (INDEPENDENT_AMBULATORY_CARE_PROVIDER_SITE_OTHER): Payer: 59 | Admitting: Family Medicine

## 2020-06-14 VITALS — BP 113/78 | HR 89 | Temp 98.2°F | Ht 66.0 in | Wt 255.0 lb

## 2020-06-14 DIAGNOSIS — E559 Vitamin D deficiency, unspecified: Secondary | ICD-10-CM

## 2020-06-14 DIAGNOSIS — Z9189 Other specified personal risk factors, not elsewhere classified: Secondary | ICD-10-CM | POA: Diagnosis not present

## 2020-06-14 DIAGNOSIS — I10 Essential (primary) hypertension: Secondary | ICD-10-CM

## 2020-06-14 DIAGNOSIS — Z6841 Body Mass Index (BMI) 40.0 and over, adult: Secondary | ICD-10-CM

## 2020-06-14 DIAGNOSIS — R7401 Elevation of levels of liver transaminase levels: Secondary | ICD-10-CM

## 2020-06-14 MED ORDER — VITAMIN D (ERGOCALCIFEROL) 1.25 MG (50000 UNIT) PO CAPS: 50000.0000 [IU] | ORAL_CAPSULE | ORAL | 0 refills | Status: DC

## 2020-06-14 MED ORDER — TELMISARTAN 20 MG PO TABS: 10.0000 mg | ORAL_TABLET | Freq: Every day | ORAL | 0 refills | Status: DC

## 2020-06-15 NOTE — Progress Notes (Signed)
Chief Complaint:   OBESITY Tiffany Norton is here to discuss her progress with her obesity treatment plan along with follow-up of her obesity related diagnoses. Tiffany Norton is on the Category 4 Plan and states she is following her eating plan approximately 80% of the time. Tiffany Norton states she is exercising 0 minutes 0 times per week.  Today's visit was #: 16 Starting weight: 290 lbs Starting date: 09/08/2019 Today's weight: 255 lbs Today's date: 06/14/2020 Total lbs lost to date: 35 Total lbs lost since last in-office visit: 0  Interim History: Tiffany Norton had an Orthopedic appointment October 8th, but was told to return to see him when BMI was less than 40. Over the last few weeks she wasn't so strict on the Category 4 meal plan. She mentions she may not weigh food like she needs to and is not always controlled in her snack calorie allotment.   Subjective:   Vitamin D deficiency. Last Vitamin D 40.9. Tiffany Norton is on prescription Vitamin D supplementation. No nausea, vomiting, or muscle weakness, but endorses fatigue.   Ref. Range 01/27/2020 16:34  Vitamin D, 25-Hydroxy Latest Ref Range: 30.0 - 100.0 ng/mL 40.9   Transaminitis. Last alkaline phosphatase was elevated, but AST and ALT were within normal limits (previously ALT elevated). No alcohol; increase in medications.  Essential hypertension. Blood pressure is within normal limits. Asjia is experiencing some dizziness/lightheadedness. She is on Micardis 20 mg.  BP Readings from Last 3 Encounters:  06/14/20 113/78  05/24/20 108/75  05/10/20 117/76   Lab Results  Component Value Date   CREATININE 0.97 01/27/2020   CREATININE 0.89 09/08/2019   At risk for impaired function of liver. [name] is at risk for impaired function of liver due to likely diagnosis of fatty liver as evidenced by recent elevated liver enzymes.   Assessment/Plan:   Vitamin D deficiency. Low Vitamin D level contributes to fatigue and are associated with  obesity, breast, and colon cancer. She was given a refill on her Vitamin D, Ergocalciferol, (DRISDOL) 1.25 MG (50000 UNIT) CAPS capsule every week #4 with 0 refills and will follow-up for routine testing of Vitamin D, at least 2-3 times per year to avoid over-replacement.   Transaminitis. Repeat labs will be checked in January 2022.  Essential hypertension. Haydin is working on healthy weight loss and exercise to improve blood pressure control. We will watch for signs of hypotension as she continues her lifestyle modifications. She will decrease telmisartan (MICARDIS) to 10 mg daily.   At risk for impaired function of liver. Tiffany Norton was given approximately 15 minutes of counseling today regarding prevention of impaired liver function. Tiffany Norton was educated about her risk of developing NASH or even liver failure and advised that the only proven treatment for NAFLD was weight loss of at least 5-10% of body weight.   Class 3 severe obesity with serious comorbidity and body mass index (BMI) of 40.0 to 44.9 in adult, unspecified obesity type (HCC).  Tiffany Norton is currently in the action stage of change. As such, her goal is to continue with weight loss efforts. She has agreed to the Category 4 Plan.   Exercise goals: No exercise has been prescribed at this time.  Behavioral modification strategies: increasing lean protein intake, meal planning and cooking strategies, keeping healthy foods in the home and planning for success.  Tiffany Norton has agreed to follow-up with our clinic in 2-3 weeks. She was informed of the importance of frequent follow-up visits to maximize her success with intensive lifestyle  modifications for her multiple health conditions.   Objective:   Blood pressure 113/78, pulse 89, temperature 98.2 F (36.8 C), temperature source Oral, height 5\' 6"  (1.676 m), weight 255 lb (115.7 kg), SpO2 99 %. Body mass index is 41.16 kg/m.  General: Cooperative, alert, well developed, in no acute  distress. HEENT: Conjunctivae and lids unremarkable. Cardiovascular: Regular rhythm.  Lungs: Normal work of breathing. Neurologic: No focal deficits.   Lab Results  Component Value Date   CREATININE 0.97 01/27/2020   BUN 26 01/27/2020   NA 143 01/27/2020   K 5.0 01/27/2020   CL 105 01/27/2020   CO2 23 01/27/2020   Lab Results  Component Value Date   ALT 24 01/27/2020   AST 14 01/27/2020   ALKPHOS 163 (H) 01/27/2020   BILITOT 0.4 01/27/2020   Lab Results  Component Value Date   HGBA1C 5.7 (H) 01/27/2020   HGBA1C 5.8 (H) 09/08/2019   Lab Results  Component Value Date   INSULIN 22.2 01/27/2020   INSULIN 21.1 09/08/2019   Lab Results  Component Value Date   TSH 1.570 09/08/2019   Lab Results  Component Value Date   CHOL 174 01/27/2020   HDL 43 01/27/2020   LDLCALC 112 (H) 01/27/2020   TRIG 106 01/27/2020   Lab Results  Component Value Date   WBC 10.1 09/08/2019   HGB 15.1 09/08/2019   HCT 43.8 09/08/2019   MCV 87 09/08/2019   PLT 320 09/08/2019   No results found for: IRON, TIBC, FERRITIN  Attestation Statements:   Reviewed by clinician on day of visit: allergies, medications, problem list, medical history, surgical history, family history, social history, and previous encounter notes.  I, 09/10/2019, am acting as transcriptionist for Tiffany Payment, MD   I have reviewed the above documentation for accuracy and completeness, and I agree with the above. - Reuben Likes, MD

## 2020-06-25 ENCOUNTER — Other Ambulatory Visit (INDEPENDENT_AMBULATORY_CARE_PROVIDER_SITE_OTHER): Payer: Self-pay | Admitting: Family Medicine

## 2020-06-25 DIAGNOSIS — I10 Essential (primary) hypertension: Secondary | ICD-10-CM

## 2020-07-03 ENCOUNTER — Other Ambulatory Visit: Payer: Self-pay

## 2020-07-03 ENCOUNTER — Encounter (INDEPENDENT_AMBULATORY_CARE_PROVIDER_SITE_OTHER): Payer: Self-pay | Admitting: Family Medicine

## 2020-07-03 ENCOUNTER — Ambulatory Visit (INDEPENDENT_AMBULATORY_CARE_PROVIDER_SITE_OTHER): Payer: 59 | Admitting: Family Medicine

## 2020-07-03 VITALS — BP 138/83 | HR 87 | Temp 98.5°F | Ht 66.0 in | Wt 255.0 lb

## 2020-07-03 DIAGNOSIS — Z6841 Body Mass Index (BMI) 40.0 and over, adult: Secondary | ICD-10-CM

## 2020-07-03 DIAGNOSIS — I10 Essential (primary) hypertension: Secondary | ICD-10-CM | POA: Diagnosis not present

## 2020-07-03 DIAGNOSIS — E7849 Other hyperlipidemia: Secondary | ICD-10-CM

## 2020-07-06 NOTE — Progress Notes (Signed)
Chief Complaint:   OBESITY Tiffany Norton is here to discuss her progress with her obesity treatment plan along with follow-up of her obesity related diagnoses. Tiffany Norton is on the Category 4 Plan and states she is following her eating plan approximately 50% of the time. Tiffany Norton states she is doing 0 minutes 0 times per week.  Today's visit was #: 17 Starting weight: 290 lbs Starting date: 09/08/2019 Today's weight: 255 lbs Today's date: 07/03/2020 Total lbs lost to date: 35 Total lbs lost since last in-office visit: 0  Interim History: Andre reports some struggle with following the meal plan. She is eating off the plan including out. Eating breakfast on the plan and lunch on the plan. May order things like pizza or other non-nutritional options. She finds the meal planning to be difficult. Looking for store bought soup options.  Subjective:   1. Essential hypertension Tiffany Norton's blood pressure is well controlled. She denies chest pain, chest pressure, or headache. She is on Micardis.  2. Other hyperlipidemia Tiffany Norton's last LDL was 112, HDL 43, triglycerides 106. She is on pravachol and her levels are not at goal.  Assessment/Plan:   1. Essential hypertension Tiffany Norton will continue her current medications, no change in dosage. She will continue working on healthy weight loss and exercise to improve blood pressure control. We will watch for signs of hypotension as she continues her lifestyle modifications.  2. Other hyperlipidemia Cardiovascular risk and specific lipid/LDL goals reviewed.  We discussed several lifestyle modifications today and Tiffany Norton will continue statin, and will continue to work on diet, exercise and weight loss efforts. Orders and follow up as documented in patient record.   Counseling Intensive lifestyle modifications are the first line treatment for this issue. . Dietary changes: Increase soluble fiber. Decrease simple carbohydrates. . Exercise changes: Moderate  to vigorous-intensity aerobic activity 150 minutes per week if tolerated. . Lipid-lowering medications: see documented in medical record.  3. Class 3 severe obesity with serious comorbidity and body mass index (BMI) of 40.0 to 44.9 in adult, unspecified obesity type (HCC) Tiffany Norton is currently in the action stage of change. As such, her goal is to continue with weight loss efforts. She has agreed to the Category 4 Plan and keeping a food journal and adhering to recommended goals of 550-700 calories and 50+ grams of protein at supper daily.   Exercise goals: No exercise has been prescribed at this time.  Behavioral modification strategies: increasing lean protein intake, decreasing eating out, meal planning and cooking strategies, holiday eating strategies  and planning for success.  Tiffany Norton has agreed to follow-up with our clinic in 3 to 4 weeks. She was informed of the importance of frequent follow-up visits to maximize her success with intensive lifestyle modifications for her multiple health conditions.   Objective:   Blood pressure 138/83, pulse 87, temperature 98.5 F (36.9 C), temperature source Oral, height 5\' 6"  (1.676 m), weight 255 lb (115.7 kg), SpO2 98 %. Body mass index is 41.16 kg/m.  General: Cooperative, alert, well developed, in no acute distress. HEENT: Conjunctivae and lids unremarkable. Cardiovascular: Regular rhythm.  Lungs: Normal work of breathing. Neurologic: No focal deficits.   Lab Results  Component Value Date   CREATININE 0.97 01/27/2020   BUN 26 01/27/2020   NA 143 01/27/2020   K 5.0 01/27/2020   CL 105 01/27/2020   CO2 23 01/27/2020   Lab Results  Component Value Date   ALT 24 01/27/2020   AST 14 01/27/2020   ALKPHOS  163 (H) 01/27/2020   BILITOT 0.4 01/27/2020   Lab Results  Component Value Date   HGBA1C 5.7 (H) 01/27/2020   HGBA1C 5.8 (H) 09/08/2019   Lab Results  Component Value Date   INSULIN 22.2 01/27/2020   INSULIN 21.1 09/08/2019    Lab Results  Component Value Date   TSH 1.570 09/08/2019   Lab Results  Component Value Date   CHOL 174 01/27/2020   HDL 43 01/27/2020   LDLCALC 112 (H) 01/27/2020   TRIG 106 01/27/2020   Lab Results  Component Value Date   WBC 10.1 09/08/2019   HGB 15.1 09/08/2019   HCT 43.8 09/08/2019   MCV 87 09/08/2019   PLT 320 09/08/2019   No results found for: IRON, TIBC, FERRITIN  Attestation Statements:   Reviewed by clinician on day of visit: allergies, medications, problem list, medical history, surgical history, family history, social history, and previous encounter notes.  Time spent on visit including pre-visit chart review and post-visit care and charting was 15 minutes.    I, Burt Knack, am acting as transcriptionist for Reuben Likes, MD.  I have reviewed the above documentation for accuracy and completeness, and I agree with the above. - Katherina Mires, MD

## 2020-07-11 ENCOUNTER — Other Ambulatory Visit (INDEPENDENT_AMBULATORY_CARE_PROVIDER_SITE_OTHER): Payer: Self-pay | Admitting: Family Medicine

## 2020-07-11 ENCOUNTER — Encounter (INDEPENDENT_AMBULATORY_CARE_PROVIDER_SITE_OTHER): Payer: Self-pay

## 2020-07-11 DIAGNOSIS — E559 Vitamin D deficiency, unspecified: Secondary | ICD-10-CM

## 2020-07-11 NOTE — Telephone Encounter (Signed)
MyChart message sent to pt to find out if they have enough medication to get them through until next appt.   

## 2020-07-11 NOTE — Telephone Encounter (Signed)
This patient was last seen by Dr. Ukleja, and currently has an upcoming appt scheduled on 08/02/20 with her.  

## 2020-07-23 ENCOUNTER — Encounter (INDEPENDENT_AMBULATORY_CARE_PROVIDER_SITE_OTHER): Payer: Self-pay | Admitting: Family Medicine

## 2020-07-25 ENCOUNTER — Other Ambulatory Visit (INDEPENDENT_AMBULATORY_CARE_PROVIDER_SITE_OTHER): Payer: Self-pay

## 2020-07-25 DIAGNOSIS — I10 Essential (primary) hypertension: Secondary | ICD-10-CM

## 2020-07-25 MED ORDER — TELMISARTAN 20 MG PO TABS: 10.0000 mg | ORAL_TABLET | Freq: Every day | ORAL | 0 refills | Status: DC

## 2020-08-02 ENCOUNTER — Ambulatory Visit (INDEPENDENT_AMBULATORY_CARE_PROVIDER_SITE_OTHER): Payer: 59 | Admitting: Family Medicine

## 2020-08-02 ENCOUNTER — Other Ambulatory Visit: Payer: Self-pay

## 2020-08-02 ENCOUNTER — Encounter (INDEPENDENT_AMBULATORY_CARE_PROVIDER_SITE_OTHER): Payer: Self-pay | Admitting: Family Medicine

## 2020-08-02 VITALS — BP 129/83 | HR 67 | Temp 98.2°F | Ht 66.0 in | Wt 258.0 lb

## 2020-08-02 DIAGNOSIS — E559 Vitamin D deficiency, unspecified: Secondary | ICD-10-CM

## 2020-08-02 DIAGNOSIS — E7849 Other hyperlipidemia: Secondary | ICD-10-CM

## 2020-08-02 DIAGNOSIS — Z9189 Other specified personal risk factors, not elsewhere classified: Secondary | ICD-10-CM

## 2020-08-02 DIAGNOSIS — Z6841 Body Mass Index (BMI) 40.0 and over, adult: Secondary | ICD-10-CM | POA: Diagnosis not present

## 2020-08-02 MED ORDER — VITAMIN D (ERGOCALCIFEROL) 1.25 MG (50000 UNIT) PO CAPS: 50000.0000 [IU] | ORAL_CAPSULE | ORAL | 0 refills | Status: DC

## 2020-08-02 NOTE — Progress Notes (Signed)
Chief Complaint:   OBESITY Tiffany Norton is here to discuss her progress with her obesity treatment plan along with follow-up of her obesity related diagnoses. Tiffany Norton is on the Category 4 Plan and keeping a food journal and adhering to recommended goals of 550-700 calories and 50+ grams of protein at supper daily and states she is following her eating plan approximately 20% of the time. Tiffany Norton states she is doing 0 minutes 0 times per week.  Today's visit was #: 18 Starting weight: 290 lbs Starting date: 09/08/2019 Today's weight: 258 lbs Today's date: 08/02/2020 Total lbs lost to date: 32 Total lbs lost since last in-office visit: 0  Interim History: Tiffany Norton does realize she has been more indulgent with her food choices over the last few weeks. She has been baking and cooking more holiday treats. She is planning on getting together with family for Christmas.  Subjective:   1. Vitamin D deficiency Tiffany Norton denies nausea, vomiting, or muscle weakness, but notes fatigue. She is on prescription Vit D.  2. Other hyperlipidemia Tiffany Norton's last LDL was 112, HDL 43, and triglycerides 160. She denies myalgias.   3. At risk for osteoporosis Tiffany Norton is at higher risk of osteopenia and osteoporosis due to Vitamin D deficiency.   Assessment/Plan:   1. Vitamin D deficiency Low Vitamin D level contributes to fatigue and are associated with obesity, breast, and colon cancer. We will refill prescription Vitamin D for 1 month, and we will repeat labs at her next appointment. She will follow-up for routine testing of Vitamin D, at least 2-3 times per year to avoid over-replacement.  - Vitamin D, Ergocalciferol, (DRISDOL) 1.25 MG (50000 UNIT) CAPS capsule; Take 1 capsule (50,000 Units total) by mouth every 7 (seven) days.  Dispense: 4 capsule; Refill: 0  2. Other hyperlipidemia Cardiovascular risk and specific lipid/LDL goals reviewed. We discussed several lifestyle modifications today and Tiffany Norton  will continue to work on diet, exercise and weight loss efforts. We will repeat labs at her next appointment. Orders and follow up as documented in patient record.   Counseling Intensive lifestyle modifications are the first line treatment for this issue. . Dietary changes: Increase soluble fiber. Decrease simple carbohydrates. . Exercise changes: Moderate to vigorous-intensity aerobic activity 150 minutes per week if tolerated. . Lipid-lowering medications: see documented in medical record.  3. At risk for osteoporosis Tiffany Norton was given approximately 15 minutes of osteoporosis prevention counseling today. Tiffany Norton is at risk for osteopenia and osteoporosis due to her Vitamin D deficiency. She was encouraged to take her Vitamin D and follow her higher calcium diet and increase strengthening exercise to help strengthen her bones and decrease her risk of osteopenia and osteoporosis.  Repetitive spaced learning was employed today to elicit superior memory formation and behavioral change.  4. Class 3 severe obesity with serious comorbidity and body mass index (BMI) of 40.0 to 44.9 in adult, unspecified obesity type (HCC) Tiffany Norton is currently in the action stage of change. As such, her goal is to continue with weight loss efforts. She has agreed to the Category 4 Plan.   We will repeat IC at her next appointment.  Exercise goals: No exercise has been prescribed at this time.  Behavioral modification strategies: increasing lean protein intake, meal planning and cooking strategies, keeping healthy foods in the home, holiday eating strategies  and planning for success.  Tiffany Norton has agreed to follow-up with our clinic in 3 to 4 weeks. She was informed of the importance of frequent follow-up visits  to maximize her success with intensive lifestyle modifications for her multiple health conditions.   Objective:   Blood pressure 129/83, pulse 67, temperature 98.2 F (36.8 C), temperature source Oral,  height 5\' 6"  (1.676 m), weight 258 lb (117 kg), SpO2 100 %. Body mass index is 41.64 kg/m.  General: Cooperative, alert, well developed, in no acute distress. HEENT: Conjunctivae and lids unremarkable. Cardiovascular: Regular rhythm.  Lungs: Normal work of breathing. Neurologic: No focal deficits.   Lab Results  Component Value Date   CREATININE 0.97 01/27/2020   BUN 26 01/27/2020   NA 143 01/27/2020   K 5.0 01/27/2020   CL 105 01/27/2020   CO2 23 01/27/2020   Lab Results  Component Value Date   ALT 24 01/27/2020   AST 14 01/27/2020   ALKPHOS 163 (H) 01/27/2020   BILITOT 0.4 01/27/2020   Lab Results  Component Value Date   HGBA1C 5.7 (H) 01/27/2020   HGBA1C 5.8 (H) 09/08/2019   Lab Results  Component Value Date   INSULIN 22.2 01/27/2020   INSULIN 21.1 09/08/2019   Lab Results  Component Value Date   TSH 1.570 09/08/2019   Lab Results  Component Value Date   CHOL 174 01/27/2020   HDL 43 01/27/2020   LDLCALC 112 (H) 01/27/2020   TRIG 106 01/27/2020   Lab Results  Component Value Date   WBC 10.1 09/08/2019   HGB 15.1 09/08/2019   HCT 43.8 09/08/2019   MCV 87 09/08/2019   PLT 320 09/08/2019   No results found for: IRON, TIBC, FERRITIN  Attestation Statements:   Reviewed by clinician on day of visit: allergies, medications, problem list, medical history, surgical history, family history, social history, and previous encounter notes.   I, 09/10/2019, am acting as transcriptionist for Burt Knack, MD. I have reviewed the above documentation for accuracy and completeness, and I agree with the above. - Reuben Likes, MD

## 2020-08-20 ENCOUNTER — Other Ambulatory Visit (INDEPENDENT_AMBULATORY_CARE_PROVIDER_SITE_OTHER): Payer: Self-pay | Admitting: Family Medicine

## 2020-08-20 DIAGNOSIS — I10 Essential (primary) hypertension: Secondary | ICD-10-CM

## 2020-08-21 ENCOUNTER — Encounter (INDEPENDENT_AMBULATORY_CARE_PROVIDER_SITE_OTHER): Payer: Self-pay

## 2020-08-21 NOTE — Telephone Encounter (Signed)
This patient was last seen by Dr. Lawson Radar, and currently has an upcoming appt scheduled on 08/29/20 with her.

## 2020-08-21 NOTE — Telephone Encounter (Signed)
Message sent to pt-[facility] 

## 2020-08-24 ENCOUNTER — Other Ambulatory Visit (INDEPENDENT_AMBULATORY_CARE_PROVIDER_SITE_OTHER): Payer: Self-pay | Admitting: Family Medicine

## 2020-08-24 DIAGNOSIS — E559 Vitamin D deficiency, unspecified: Secondary | ICD-10-CM

## 2020-08-29 ENCOUNTER — Other Ambulatory Visit: Payer: Self-pay

## 2020-08-29 ENCOUNTER — Ambulatory Visit (INDEPENDENT_AMBULATORY_CARE_PROVIDER_SITE_OTHER): Payer: 59 | Admitting: Family Medicine

## 2020-08-29 ENCOUNTER — Encounter (INDEPENDENT_AMBULATORY_CARE_PROVIDER_SITE_OTHER): Payer: Self-pay | Admitting: Family Medicine

## 2020-08-29 VITALS — BP 118/77 | HR 84 | Temp 97.9°F | Ht 66.0 in | Wt 261.0 lb

## 2020-08-29 DIAGNOSIS — E7849 Other hyperlipidemia: Secondary | ICD-10-CM | POA: Diagnosis not present

## 2020-08-29 DIAGNOSIS — R0602 Shortness of breath: Secondary | ICD-10-CM

## 2020-08-29 DIAGNOSIS — E559 Vitamin D deficiency, unspecified: Secondary | ICD-10-CM | POA: Diagnosis not present

## 2020-08-29 DIAGNOSIS — Z9189 Other specified personal risk factors, not elsewhere classified: Secondary | ICD-10-CM | POA: Diagnosis not present

## 2020-08-29 DIAGNOSIS — Z6841 Body Mass Index (BMI) 40.0 and over, adult: Secondary | ICD-10-CM

## 2020-08-29 DIAGNOSIS — R7303 Prediabetes: Secondary | ICD-10-CM | POA: Diagnosis not present

## 2020-08-29 MED ORDER — VITAMIN D (ERGOCALCIFEROL) 1.25 MG (50000 UNIT) PO CAPS: 50000.0000 [IU] | ORAL_CAPSULE | ORAL | 0 refills | Status: DC

## 2020-08-30 ENCOUNTER — Encounter (INDEPENDENT_AMBULATORY_CARE_PROVIDER_SITE_OTHER): Payer: Self-pay | Admitting: Family Medicine

## 2020-08-30 LAB — COMPREHENSIVE METABOLIC PANEL
ALT: 20 IU/L (ref 0–32)
AST: 15 IU/L (ref 0–40)
Albumin/Globulin Ratio: 2.9 — ABNORMAL HIGH (ref 1.2–2.2)
Albumin: 4.4 g/dL (ref 3.8–4.8)
Alkaline Phosphatase: 148 IU/L — ABNORMAL HIGH (ref 44–121)
BUN/Creatinine Ratio: 21 (ref 12–28)
BUN: 16 mg/dL (ref 8–27)
Bilirubin Total: 0.4 mg/dL (ref 0.0–1.2)
CO2: 23 mmol/L (ref 20–29)
Calcium: 10 mg/dL (ref 8.7–10.3)
Chloride: 105 mmol/L (ref 96–106)
Creatinine, Ser: 0.77 mg/dL (ref 0.57–1.00)
GFR calc Af Amer: 95 mL/min/{1.73_m2} (ref >59)
GFR calc non Af Amer: 82 mL/min/{1.73_m2} (ref >59)
Globulin, Total: 1.5 g/dL (ref 1.5–4.5)
Glucose: 96 mg/dL (ref 65–99)
Potassium: 5 mmol/L (ref 3.5–5.2)
Sodium: 143 mmol/L (ref 134–144)
Total Protein: 5.9 g/dL — ABNORMAL LOW (ref 6.0–8.5)

## 2020-08-30 LAB — HEMOGLOBIN A1C
Est. average glucose Bld gHb Est-mCnc: 123 mg/dL
Hgb A1c MFr Bld: 5.9 % — ABNORMAL HIGH (ref 4.8–5.6)

## 2020-08-30 LAB — LIPID PANEL WITH LDL/HDL RATIO
Cholesterol, Total: 178 mg/dL (ref 100–199)
HDL: 52 mg/dL (ref >39)
LDL Chol Calc (NIH): 110 mg/dL — ABNORMAL HIGH (ref 0–99)
LDL/HDL Ratio: 2.1 ratio (ref 0.0–3.2)
Triglycerides: 84 mg/dL (ref 0–149)
VLDL Cholesterol Cal: 16 mg/dL (ref 5–40)

## 2020-08-30 LAB — VITAMIN D 25 HYDROXY (VIT D DEFICIENCY, FRACTURES): Vit D, 25-Hydroxy: 36.3 ng/mL (ref 30.0–100.0)

## 2020-08-30 LAB — INSULIN, RANDOM: INSULIN: 10.5 u[IU]/mL (ref 2.6–24.9)

## 2020-08-30 NOTE — Telephone Encounter (Signed)
Please advise 

## 2020-09-04 NOTE — Progress Notes (Signed)
Chief Complaint:   OBESITY Tiffany Norton is here to discuss her progress with her obesity treatment plan along with follow-up of her obesity related diagnoses. Tiffany Norton is on the Category 4 Plan and states she is following her eating plan approximately 70% of the time. Tiffany Norton states she is not exercising.  Today's visit was #: 19 Starting weight: 290 lbs Starting date: 09/08/2019 Today's weight: 261 lbs Today's date: 08/29/2020 Total lbs lost to date: 29 lbs Total lbs lost since last in-office visit: 0  Interim History: Tiffany Norton has been busy with holiday season. She did not monitor food intact as much as she did previously. Found that the more indulgently she ate, the harder it was to get on track. All indulgent food out of the house. Tiffany Norton has already stated pulling herself back on track.  Subjective:   1. SOB (shortness of breath) on exertion SOB is the same as first appointment. Last RMR 2136.   2. Prediabetes A1c 5.7 and insulin 22.2. Tiffany Norton is not on medication.  3. Other hyperlipidemia Tiffany Norton is on statin (Provachol 40 mg daily)  4. Vitamin D deficiency Tiffany Norton denies nausea, vomiting, or muscle weakness, but notes fatigue.  5. At risk for impaired function of liver Tiffany Norton is at risk for impaired function of liver due to likely diagnosis of fatty liver as evidenced by recent elevated liver enzymes.     Assessment/Plan:   1. SOB (shortness of breath) on exertion IC today.  2. Prediabetes - Hemoglobin A1c - Insulin, random  3. Other hyperlipidemia - Lipid Panel With LDL/HDL Ratio - Comprehensive metabolic panel  4. Vitamin D deficiency Will check Vitamin D level today.  - Vitamin D, Ergocalciferol, (DRISDOL) 1.25 MG (50000 UNIT) CAPS capsule; Take 1 capsule (50,000 Units total) by mouth every 7 (seven) days.  Dispense: 4 capsule; Refill: 0 - VITAMIN D 25 Hydroxy (Vit-D Deficiency, Fractures)  5. At risk for impaired function of liver Tiffany Norton was given  approximately 15 minutes of counseling today regarding prevention of impaired liver function. Tiffany Norton was educated about her risk of developing NASH or even liver failure and advised that the only proven treatment for NAFLD was weight loss of at least 5-10% of body weight.   6. Class 3 severe obesity with serious comorbidity and body mass index (BMI) of 40.0 to 44.9 in adult, unspecified obesity type (HCC)  Tiffany Norton is currently in the action stage of change. As such, her goal is to continue with weight loss efforts. She has agreed to the Category 3 Plan.   Exercise goals: No exercise has been prescribed at this time.  Behavioral modification strategies: increasing lean protein intake, meal planning and cooking strategies and keeping healthy foods in the home.  Tiffany Norton has agreed to follow-up with our clinic in 2 weeks. She was informed of the importance of frequent follow-up visits to maximize her success with intensive lifestyle modifications for her multiple health conditions.   Tiffany Norton was informed we would discuss her lab results at her next visit unless there is a critical issue that needs to be addressed sooner. Tiffany Norton agreed to keep her next visit at the agreed upon time to discuss these results.  Objective:   Blood pressure 118/77, pulse 84, temperature 97.9 F (36.6 C), temperature source Oral, height 5\' 6"  (1.676 m), weight 261 lb (118.4 kg), SpO2 97 %. Body mass index is 42.13 kg/m.  General: Cooperative, alert, well developed, in no acute distress. HEENT: Conjunctivae and lids unremarkable. Cardiovascular: Regular rhythm.  Lungs: Normal work of breathing. Neurologic: No focal deficits.   Lab Results  Component Value Date   CREATININE 0.77 08/29/2020   BUN 16 08/29/2020   NA 143 08/29/2020   K 5.0 08/29/2020   CL 105 08/29/2020   CO2 23 08/29/2020   Lab Results  Component Value Date   ALT 20 08/29/2020   AST 15 08/29/2020   ALKPHOS 148 (H) 08/29/2020   BILITOT 0.4  08/29/2020   Lab Results  Component Value Date   HGBA1C 5.9 (H) 08/29/2020   HGBA1C 5.7 (H) 01/27/2020   HGBA1C 5.8 (H) 09/08/2019   Lab Results  Component Value Date   INSULIN 10.5 08/29/2020   INSULIN 22.2 01/27/2020   INSULIN 21.1 09/08/2019   Lab Results  Component Value Date   TSH 1.570 09/08/2019   Lab Results  Component Value Date   CHOL 178 08/29/2020   HDL 52 08/29/2020   LDLCALC 110 (H) 08/29/2020   TRIG 84 08/29/2020   Lab Results  Component Value Date   WBC 10.1 09/08/2019   HGB 15.1 09/08/2019   HCT 43.8 09/08/2019   MCV 87 09/08/2019   PLT 320 09/08/2019   No results found for: IRON, TIBC, FERRITIN    Attestation Statements:   Reviewed by clinician on day of visit: allergies, medications, problem list, medical history, surgical history, family history, social history, and previous encounter notes.   I, Delorse Limber, am acting as transcriptionist for Reuben Likes, MD. I have reviewed the above documentation for accuracy and completeness, and I agree with the above. - Katherina Mires, MD

## 2020-09-13 ENCOUNTER — Ambulatory Visit (INDEPENDENT_AMBULATORY_CARE_PROVIDER_SITE_OTHER): Payer: 59 | Admitting: Family Medicine

## 2020-09-13 ENCOUNTER — Other Ambulatory Visit: Payer: Self-pay

## 2020-09-13 ENCOUNTER — Encounter (INDEPENDENT_AMBULATORY_CARE_PROVIDER_SITE_OTHER): Payer: Self-pay | Admitting: Family Medicine

## 2020-09-13 VITALS — BP 130/85 | HR 85 | Temp 98.4°F | Ht 66.0 in | Wt 259.0 lb

## 2020-09-13 DIAGNOSIS — Z6841 Body Mass Index (BMI) 40.0 and over, adult: Secondary | ICD-10-CM | POA: Diagnosis not present

## 2020-09-13 DIAGNOSIS — I10 Essential (primary) hypertension: Secondary | ICD-10-CM | POA: Diagnosis not present

## 2020-09-13 DIAGNOSIS — E559 Vitamin D deficiency, unspecified: Secondary | ICD-10-CM

## 2020-09-14 NOTE — Progress Notes (Signed)
Chief Complaint:   OBESITY Perl is here to discuss her progress with her obesity treatment plan along with follow-up of her obesity related diagnoses. Daisia is on the Category 3 Plan and states she is following her eating plan approximately 85% of the time. Mila states she is not exercising.  Today's visit was #: 20 Starting weight: 290 lbs Starting date: 09/08/2019 Today's weight: 259 lbs Today's date: 09/13/2020 Total lbs lost to date: 31 lbs Total lbs lost since last in-office visit: 2 lbs  Interim History: Jolicia has been getting more on meal plan over last few weeks. She is trying to keep the food on plan in the house. No plans or big obstacles for the upcoming few weeks. Ishika is cooking at home more. Evenings are the hardest for snacking.  Subjective:   1. Essential hypertension Daphna's blood pressure is controlled today. Denies chest pain, chest pressure, and headache. Addalynn is on Telmisartan 20 mg.   2. Vitamin D deficiency Rosella is on prescription Vitamin D. Denies nausea, vomiting, or muscle weakness but notes fatigue.    Assessment/Plan:   1. Essential hypertension Low Vitamin D level contributes to fatigue and are associated with obesity, breast, and colon cancer. Milanna agrees to continue to take prescription Vitamin D @50 ,000 IU every week and will follow-up for routine testing of Vitamin D, at least 2-3 times per year to avoid over-replacement. Roger will continue current medications and no change in dose.  2. Vitamin D deficiency Low Vitamin D level contributes to fatigue and are associated with obesity, breast, and colon cancer. Stormey agrees to continue to take prescription Vitamin D @50 ,000 IU every week and will follow-up for routine testing of Vitamin D, at least 2-3 times per year to avoid over-replacement. No refill needed at this time.  3. Class 3 severe obesity with serious comorbidity and body mass index (BMI) of 40.0 to 44.9 in  adult, unspecified obesity type (HCC)  Clairissa is currently in the action stage of change. As such, her goal is to continue with weight loss efforts. She has agreed to the Category 3 Plan.   Exercise goals: No exercise has been prescribed at this time.  Behavioral modification strategies: increasing lean protein intake, meal planning and cooking strategies, keeping healthy foods in the home and planning for success.  Robecca has agreed to follow-up with our clinic in 2 weeks. She was informed of the importance of frequent follow-up visits to maximize her success with intensive lifestyle modifications for her multiple health conditions.    Objective:   Blood pressure 130/85, pulse 85, temperature 98.4 F (36.9 C), temperature source Oral, height 5\' 6"  (1.676 m), weight 259 lb (117.5 kg), SpO2 97 %. Body mass index is 41.8 kg/m.  General: Cooperative, alert, well developed, in no acute distress. HEENT: Conjunctivae and lids unremarkable. Cardiovascular: Regular rhythm.  Lungs: Normal work of breathing. Neurologic: No focal deficits.   Lab Results  Component Value Date   CREATININE 0.77 08/29/2020   BUN 16 08/29/2020   NA 143 08/29/2020   K 5.0 08/29/2020   CL 105 08/29/2020   CO2 23 08/29/2020   Lab Results  Component Value Date   ALT 20 08/29/2020   AST 15 08/29/2020   ALKPHOS 148 (H) 08/29/2020   BILITOT 0.4 08/29/2020   Lab Results  Component Value Date   HGBA1C 5.9 (H) 08/29/2020   HGBA1C 5.7 (H) 01/27/2020   HGBA1C 5.8 (H) 09/08/2019   Lab Results  Component Value  Date   INSULIN 10.5 08/29/2020   INSULIN 22.2 01/27/2020   INSULIN 21.1 09/08/2019   Lab Results  Component Value Date   TSH 1.570 09/08/2019   Lab Results  Component Value Date   CHOL 178 08/29/2020   HDL 52 08/29/2020   LDLCALC 110 (H) 08/29/2020   TRIG 84 08/29/2020   Lab Results  Component Value Date   WBC 10.1 09/08/2019   HGB 15.1 09/08/2019   HCT 43.8 09/08/2019   MCV 87  09/08/2019   PLT 320 09/08/2019   No results found for: IRON, TIBC, FERRITIN   Attestation Statements:   Reviewed by clinician on day of visit: allergies, medications, problem list, medical history, surgical history, family history, social history, and previous encounter notes.   I, Delorse Limber, am acting as transcriptionist for Reuben Likes, MD.  I have reviewed the above documentation for accuracy and completeness, and I agree with the above. - Katherina Mires, MD

## 2020-09-17 ENCOUNTER — Other Ambulatory Visit (INDEPENDENT_AMBULATORY_CARE_PROVIDER_SITE_OTHER): Payer: Self-pay | Admitting: Family Medicine

## 2020-09-17 ENCOUNTER — Encounter (INDEPENDENT_AMBULATORY_CARE_PROVIDER_SITE_OTHER): Payer: Self-pay | Admitting: Family Medicine

## 2020-09-17 DIAGNOSIS — I10 Essential (primary) hypertension: Secondary | ICD-10-CM

## 2020-09-18 ENCOUNTER — Encounter (INDEPENDENT_AMBULATORY_CARE_PROVIDER_SITE_OTHER): Payer: Self-pay

## 2020-09-18 NOTE — Telephone Encounter (Signed)
Would you like to refill? Pt stated that she will be out before her next visit.

## 2020-09-18 NOTE — Telephone Encounter (Signed)
Dr Ukleja pt °

## 2020-09-18 NOTE — Telephone Encounter (Signed)
Last OV with Dr Ukleja 

## 2020-09-18 NOTE — Telephone Encounter (Signed)
MyChart message sent to pt to find out if they have enough medication to get them through until next appt.   

## 2020-09-19 ENCOUNTER — Encounter: Payer: Self-pay | Admitting: Obstetrics and Gynecology

## 2020-09-19 ENCOUNTER — Other Ambulatory Visit: Payer: Self-pay

## 2020-09-19 ENCOUNTER — Ambulatory Visit: Payer: 59 | Admitting: Obstetrics and Gynecology

## 2020-09-19 ENCOUNTER — Other Ambulatory Visit (HOSPITAL_COMMUNITY)
Admission: RE | Admit: 2020-09-19 | Discharge: 2020-09-19 | Disposition: A | Discharge status: Home / Self Care | Payer: 59 | Source: Ambulatory Visit | Attending: Obstetrics and Gynecology | Admitting: Obstetrics and Gynecology

## 2020-09-19 VITALS — BP 142/80 | HR 99 | Ht 65.0 in | Wt 262.0 lb

## 2020-09-19 DIAGNOSIS — B372 Candidiasis of skin and nail: Secondary | ICD-10-CM

## 2020-09-19 DIAGNOSIS — Z01419 Encounter for gynecological examination (general) (routine) without abnormal findings: Secondary | ICD-10-CM

## 2020-09-19 MED ORDER — NYSTATIN 100000 UNIT/GM EX POWD: 1.0000 "application " | Freq: Three times a day (TID) | CUTANEOUS | 3 refills | Status: DC

## 2020-09-19 MED ORDER — FLUCONAZOLE 150 MG PO TABS: ORAL_TABLET | ORAL | 0 refills | Status: DC

## 2020-09-19 MED ORDER — VENLAFAXINE HCL 75 MG PO TABS: 75.0000 mg | ORAL_TABLET | Freq: Every day | ORAL | 3 refills | Status: DC

## 2020-09-19 NOTE — Patient Instructions (Signed)

## 2020-09-19 NOTE — Progress Notes (Signed)
65 y.o. [id number] Married Caucasian female here for annual exam.    Some bladder control issues, but it manageable at this point.   Will be having surgery on her knees and working on weight loss prior to this. Going to [facility]   Taking Effexor for depression and it is working well.  If she skips a dose, she notices this in her mood.   Has heat rash.   Works for [employer].   Received her Covid booster and flu vaccine.   PCP:  Catha Gosselin, MD   No LMP recorded. Patient is postmenopausal.           Sexually active: Yes.    The current method of family planning is post menopausal status.    Exercising: No.  does some walking--has bad knees Smoker:  Former  Health Maintenance: Pap:  04-18-16 Neg:Neg HR HPV, 10-27-14 Neg, 05-06-13 Neg History of abnormal Pap:  Unsure MMG: 09-15-20 pend,08-11-19 Neg/biRads1 Colonoscopy:  3-5 years ago normal;does every 5 years due to family history colon cancer--mom BMD: 05-11-13 Result :Normal  TDaP: PCP Gardasil:   no HIV:no Hep C:no Screening Labs:  PCP and Weight Management.    reports that she quit smoking about 7 years ago. She has never used smokeless tobacco. She reports current alcohol use. She reports that she does not use drugs.  Past Medical History:  Diagnosis Date  . Acid reflux   . Arthritis   . Back pain   . Bilateral swelling of feet   . CIN I (cervical intraepithelial neoplasia I)   . Constipation   . Depression   . Elevated cholesterol   . Hyperlipidemia   . Hypertension   . IBS (irritable bowel syndrome)   . Joint pain   . Knee pain   . Obesity   . Plantar fasciitis   . PMDD (premenstrual dysphoric disorder)   . Prediabetes     Past Surgical History:  Procedure Laterality Date  . ANAL FISSURE REPAIR    . ANKLE SURGERY    . Arm Surg    . CERVICAL BIOPSY  W/ LOOP ELECTRODE EXCISION  2006   CIN l  . CESAREAN SECTION    . COLPOSCOPY    . TUBAL LIGATION      Current  Outpatient Medications  Medication Sig Dispense Refill  . Multiple Vitamin (MULTIVITAMIN ADULT PO) Take by mouth.    Marland Kitchen omeprazole (PRILOSEC) 20 MG capsule Take 20 mg by mouth daily.    . pravastatin (PRAVACHOL) 40 MG tablet Take 40 mg by mouth daily.    Marland Kitchen telmisartan (MICARDIS) 20 MG tablet TAKE 1/2 TABLET BY MOUTH EVERY DAY 15 tablet 0  . venlafaxine (EFFEXOR) 75 MG tablet TAKE 1 TABLET BY MOUTH EVERY DAY 90 tablet 3  . Vitamin D, Ergocalciferol, (DRISDOL) 1.25 MG (50000 UNIT) CAPS capsule Take 1 capsule (50,000 Units total) by mouth every 7 (seven) days. 4 capsule 0  . nystatin ointment (MYCOSTATIN) Apply 1 application topically 2 (two) times daily. 30 g 1  . triamcinolone ointment (KENALOG) 0.1 % APPLY TO AFFECTED AREA TWICE A DAY 30 g 0   No current facility-administered medications for this visit.    Family History  Problem Relation Age of Onset  . Hypertension Mother   . Ovarian cancer Mother   . Colon cancer Mother   . Cancer Mother   . Depression Mother   . Obesity Mother   . Aortic aneurysm Father   . Hyperlipidemia  Father   . Sudden death Father     Review of Systems  All other systems reviewed and are negative.   Exam:   BP (!) 142/80 (Cuff Size: Large)   Pulse 99   Ht 5\' 5"  (1.651 m)   Wt 262 lb (118.8 kg)   SpO2 98%   BMI 43.60 kg/m     General appearance: alert, cooperative and appears stated age Head: normocephalic, without obvious abnormality, atraumatic Neck: no adenopathy, supple, symmetrical, trachea midline and thyroid normal to inspection and palpation Lungs: clear to auscultation bilaterally Breasts: normal appearance, no masses or tenderness, No nipple retraction or dimpling, No nipple discharge or bleeding, No axillary adenopathy Heart: regular rate and rhythm Abdomen: soft, non-tender; no masses, no organomegaly Extremities: extremities normal, atraumatic, no cyanosis or edema Skin: skin color, texture, turgor normal.  Erythematous plaques of  the inguinal folds and under pannus. Lymph nodes: cervical, supraclavicular, and axillary nodes normal. Neurologic: grossly normal  Pelvic: External genitalia:  Ring of erythema around vulva.  Hypopigmentation of superior labia minora.               No abnormal inguinal nodes palpated.              Urethra:  normal appearing urethra with no masses, tenderness or lesions              Bartholins and Skenes: normal                 Vagina: normal appearing vagina with normal color and discharge, no lesions              Cervix: no lesions              Pap taken: Yes.   Bimanual Exam:  Uterus:  normal size, contour, position, consistency, mobility, non-tender              Adnexa: no mass, fullness, tenderness              Rectal exam: Yes.  .  Confirms.              Anus:  normal sphincter tone, no lesions  Chaperone was present for exam.  Assessment:   Well woman visit with normal exam. Remote hx CIN I.  Status post LEEP.  Low vit D. FH colon metastatic to ovary in mother.  Depression.  Candida of flexural folds.  Plan: Mammogram screening discussed. Self breast awareness reviewed. Pap and HR HPV as above. Guidelines for Calcium, Vitamin D, regular exercise program including cardiovascular and weight bearing exercise. Continue Effexor 75 mg daily.   Diflucan and Nystatin powder.  Fu in 2 weeks.  Follow up annually and prn.

## 2020-09-21 LAB — CYTOLOGY - PAP
Adequacy: ABSENT
Comment: NEGATIVE
Diagnosis: NEGATIVE
Diagnosis: REACTIVE
High risk HPV: NEGATIVE

## 2020-09-27 ENCOUNTER — Encounter (INDEPENDENT_AMBULATORY_CARE_PROVIDER_SITE_OTHER): Payer: Self-pay | Admitting: Family Medicine

## 2020-09-27 ENCOUNTER — Other Ambulatory Visit: Payer: Self-pay

## 2020-09-27 ENCOUNTER — Ambulatory Visit (INDEPENDENT_AMBULATORY_CARE_PROVIDER_SITE_OTHER): Payer: 59 | Admitting: Family Medicine

## 2020-09-27 VITALS — BP 116/79 | Temp 98.1°F | Ht 66.0 in | Wt 256.0 lb

## 2020-09-27 DIAGNOSIS — R7303 Prediabetes: Secondary | ICD-10-CM

## 2020-09-27 DIAGNOSIS — E559 Vitamin D deficiency, unspecified: Secondary | ICD-10-CM

## 2020-09-27 DIAGNOSIS — Z9189 Other specified personal risk factors, not elsewhere classified: Secondary | ICD-10-CM

## 2020-09-27 DIAGNOSIS — Z6841 Body Mass Index (BMI) 40.0 and over, adult: Secondary | ICD-10-CM

## 2020-09-27 MED ORDER — VITAMIN D (ERGOCALCIFEROL) 1.25 MG (50000 UNIT) PO CAPS: 50000.0000 [IU] | ORAL_CAPSULE | ORAL | 0 refills | Status: DC

## 2020-09-29 NOTE — Progress Notes (Signed)
Chief Complaint:   OBESITY Tiffany Norton is here to discuss her progress with her obesity treatment plan along with follow-up of her obesity related diagnoses. Tiffany Norton is on the Category 3 Plan and states she is following her eating plan approximately 85% of the time. Tiffany Norton states she is doing 0 minutes 0 times per week.  Today's visit was #: 21 Starting weight: 290 lbs Starting date: 09/08/2019 Today's weight: 256 lbs Today's date: 09/27/2020 Total lbs lost to date: 34 lbs Total lbs lost since last in-office visit: 3 lbs  Interim History: Pt stopped eating peanut butter due to headaches and after stopping it, she stopped having headaches. She is eating tuna everyday at lunch with yogurt and an apple. She is having trouble with organizing and planning so she started Hello Fresh meals again.  Subjective:   1. Vitamin D deficiency Pt denies nausea, vomiting, and muscle weakness but notes fatigue. Pt is on prescription Vit D. Her last Vit D levels: 23.9 to 40.9 to 36.3.  2. Pre-diabetes Pt's last A1c was 5.9 but previously 5.7. She is not on meds.  Lab Results  Component Value Date   HGBA1C 5.9 (H) 08/29/2020   Lab Results  Component Value Date   INSULIN 10.5 08/29/2020   INSULIN 22.2 01/27/2020   INSULIN 21.1 09/08/2019    3. At risk for osteoporosis Tiffany Norton is at higher risk of osteopenia and osteoporosis due to Vitamin D deficiency.   Assessment/Plan:   1. Vitamin D deficiency Low Vitamin D level contributes to fatigue and are associated with obesity, breast, and colon cancer. She agrees to continue to take prescription Vitamin D @50 ,000 IU every week and will follow-up for routine testing of Vitamin D, at least 2-3 times per year to avoid over-replacement.  - Vitamin D, Ergocalciferol, (DRISDOL) 1.25 MG (50000 UNIT) CAPS capsule; Take 1 capsule (50,000 Units total) by mouth every 7 (seven) days.  Dispense: 4 capsule; Refill: 0  2. Pre-diabetes Tiffany Norton will continue to  work on weight loss, exercise, and decreasing simple carbohydrates to help decrease the risk of diabetes. Repeat labs in 3 months.  3. At risk for osteoporosis Tiffany Norton was given approximately 15 minutes of osteoporosis prevention counseling today. Tiffany Norton is at risk for osteopenia and osteoporosis due to her Vitamin D deficiency. She was encouraged to take her Vitamin D and follow her higher calcium diet and increase strengthening exercise to help strengthen her bones and decrease her risk of osteopenia and osteoporosis.  Repetitive spaced learning was employed today to elicit superior memory formation and behavioral change.  4. Class 3 severe obesity with serious comorbidity and body mass index (BMI) of 40.0 to 44.9 in adult, unspecified obesity type (HCC) Tiffany Norton is currently in the action stage of change. As such, her goal is to continue with weight loss efforts. She has agreed to the Category 3 Plan.   Exercise goals: As is  Behavioral modification strategies: increasing lean protein intake, meal planning and cooking strategies, keeping healthy foods in the home and planning for success.  Tiffany Norton has agreed to follow-up with our clinic in 2-3 weeks. She was informed of the importance of frequent follow-up visits to maximize her success with intensive lifestyle modifications for her multiple health conditions.   Objective:   Blood pressure 116/79, temperature 98.1 F (36.7 C), temperature source Oral, height 5\' 6"  (1.676 m), weight 256 lb (116.1 kg), SpO2 95 %. Body mass index is 41.32 kg/m.  General: Cooperative, alert, well developed, in no  acute distress. HEENT: Conjunctivae and lids unremarkable. Cardiovascular: Regular rhythm.  Lungs: Normal work of breathing. Neurologic: No focal deficits.   Lab Results  Component Value Date   CREATININE 0.77 08/29/2020   BUN 16 08/29/2020   NA 143 08/29/2020   K 5.0 08/29/2020   CL 105 08/29/2020   CO2 23 08/29/2020   Lab Results   Component Value Date   ALT 20 08/29/2020   AST 15 08/29/2020   ALKPHOS 148 (H) 08/29/2020   BILITOT 0.4 08/29/2020   Lab Results  Component Value Date   HGBA1C 5.9 (H) 08/29/2020   HGBA1C 5.7 (H) 01/27/2020   HGBA1C 5.8 (H) 09/08/2019   Lab Results  Component Value Date   INSULIN 10.5 08/29/2020   INSULIN 22.2 01/27/2020   INSULIN 21.1 09/08/2019   Lab Results  Component Value Date   TSH 1.570 09/08/2019   Lab Results  Component Value Date   CHOL 178 08/29/2020   HDL 52 08/29/2020   LDLCALC 110 (H) 08/29/2020   TRIG 84 08/29/2020   Lab Results  Component Value Date   WBC 10.1 09/08/2019   HGB 15.1 09/08/2019   HCT 43.8 09/08/2019   MCV 87 09/08/2019   PLT 320 09/08/2019    Attestation Statements:   Reviewed by clinician on day of visit: allergies, medications, problem list, medical history, surgical history, family history, social history, and previous encounter notes.  Edmund Hilda, am acting as transcriptionist for Reuben Likes, MD.   I have reviewed the above documentation for accuracy and completeness, and I agree with the above. - Katherina Mires, MD

## 2020-10-05 ENCOUNTER — Ambulatory Visit: Payer: 59 | Admitting: Obstetrics and Gynecology

## 2020-10-06 ENCOUNTER — Ambulatory Visit: Payer: 59 | Admitting: Obstetrics and Gynecology

## 2020-10-11 ENCOUNTER — Encounter (INDEPENDENT_AMBULATORY_CARE_PROVIDER_SITE_OTHER): Payer: Self-pay

## 2020-10-11 ENCOUNTER — Other Ambulatory Visit (INDEPENDENT_AMBULATORY_CARE_PROVIDER_SITE_OTHER): Payer: Self-pay | Admitting: Family Medicine

## 2020-10-11 DIAGNOSIS — I10 Essential (primary) hypertension: Secondary | ICD-10-CM

## 2020-10-11 NOTE — Telephone Encounter (Signed)
Message sent to pt-[facility] 

## 2020-10-18 ENCOUNTER — Ambulatory Visit (INDEPENDENT_AMBULATORY_CARE_PROVIDER_SITE_OTHER): Payer: 59 | Admitting: Family Medicine

## 2020-10-18 ENCOUNTER — Other Ambulatory Visit: Payer: Self-pay

## 2020-10-18 ENCOUNTER — Encounter (INDEPENDENT_AMBULATORY_CARE_PROVIDER_SITE_OTHER): Payer: Self-pay | Admitting: Family Medicine

## 2020-10-18 VITALS — BP 130/80 | HR 92 | Temp 98.6°F | Ht 66.0 in | Wt 253.0 lb

## 2020-10-18 DIAGNOSIS — Z9189 Other specified personal risk factors, not elsewhere classified: Secondary | ICD-10-CM

## 2020-10-18 DIAGNOSIS — I10 Essential (primary) hypertension: Secondary | ICD-10-CM

## 2020-10-18 DIAGNOSIS — E559 Vitamin D deficiency, unspecified: Secondary | ICD-10-CM

## 2020-10-18 DIAGNOSIS — Z6841 Body Mass Index (BMI) 40.0 and over, adult: Secondary | ICD-10-CM

## 2020-10-18 MED ORDER — VITAMIN D (ERGOCALCIFEROL) 1.25 MG (50000 UNIT) PO CAPS: 50000.0000 [IU] | ORAL_CAPSULE | ORAL | 0 refills | Status: DC

## 2020-10-18 MED ORDER — TELMISARTAN 20 MG PO TABS: 10.0000 mg | ORAL_TABLET | Freq: Every day | ORAL | 0 refills | Status: DC

## 2020-10-19 NOTE — Progress Notes (Signed)
Chief Complaint:   OBESITY Tiffany Norton is here to discuss her progress with her obesity treatment plan along with follow-up of her obesity related diagnoses. Tiffany Norton is on the Category 3 Plan and states Tiffany is following her eating plan approximately 85% of the time. Tiffany Norton states Tiffany is doing 0 minutes 0 times per week.  Today's visit was #: 22 Starting weight: 290 lbs Starting date: 09/08/2019 Today's weight: 253 lbs Today's date: 10/18/2020 Total lbs lost to date: 37 lbs Total lbs lost since last in-office visit: 3 lbs  Interim History: Tiffany Norton's niece had to have a double mastectomy, which was very emotional for Tiffany Norton. Tiffany said Tiffany has not done any emotional eating but did pick at her hands. Tiffany has been incorporating Hello [employer] into her meal plan for dinners occasionally.  Subjective:   1. Essential hypertension [name]'s BP is controlled today.  Pt denies chest pain, chest pressure and headache. Tiffany is on telmisartan.  BP Readings from Last 3 Encounters:  10/18/20 130/80  09/27/20 116/79  09/19/20 (!) 142/80    2. Vitamin D deficiency Adiana's Vitamin D level was 36.3 on 08/29/2020. Tiffany is currently taking prescription vitamin D 50,000 IU each week. Tiffany denies nausea, vomiting or muscle weakness.   Ref. Range 08/29/2020 15:18  Vitamin D, 25-Hydroxy Latest Ref Range: 30.0 - 100.0 ng/mL 36.3   3. At risk for heart disease Jordi is at a higher than average risk for cardiovascular disease due to obesity.   Assessment/Plan:   1. Essential hypertension Tiffany Norton is working on healthy weight loss and exercise to improve blood pressure control. We will watch for signs of hypotension as Tiffany continues her lifestyle modifications.  - telmisartan (MICARDIS) 20 MG tablet; Take 0.5 tablets (10 mg total) by mouth daily.  Dispense: 30 tablet; Refill: 0  2. Vitamin D deficiency Low Vitamin D level contributes to fatigue and are associated with obesity, breast, and colon cancer. Tiffany  agrees to continue to take prescription Vitamin D @50 ,000 IU every week and will follow-up for routine testing of Vitamin D, at least 2-3 times per year to avoid over-replacement.  - Vitamin D, Ergocalciferol, (DRISDOL) 1.25 MG (50000 UNIT) CAPS capsule; Take 1 capsule (50,000 Units total) by mouth every 7 (seven) days.  Dispense: 4 capsule; Refill: 0  3. At risk for heart disease Tiffany Norton was given approximately 15 minutes of coronary artery disease prevention counseling today. Tiffany is 65 y.o. female and has risk factors for heart disease including obesity. We discussed intensive lifestyle modifications today with an emphasis on specific weight loss instructions and strategies.   Repetitive spaced learning was employed today to elicit superior memory formation and behavioral change.  4. Class 3 severe obesity with serious comorbidity and body mass index (BMI) of 40.0 to 44.9 in adult, unspecified obesity type (HCC) Tiffany Norton is currently in the action stage of change. As such, her goal is to continue with weight loss efforts. Tiffany has agreed to the Category 3 Plan.   Exercise goals: As is  Behavioral modification strategies: increasing lean protein intake, meal planning and cooking strategies, keeping healthy foods in the home and planning for success.  Tiffany Norton has agreed to follow-up with our clinic in 3 weeks. Tiffany was informed of the importance of frequent follow-up visits to maximize her success with intensive lifestyle modifications for her multiple health conditions.   Objective:   Blood pressure 130/80, pulse 92, temperature 98.6 F (37 C), temperature source Oral, height 5\' 6"  (1.676 m),  weight 253 lb (114.8 kg), SpO2 99 %. Body mass index is 40.84 kg/m.  General: Cooperative, alert, well developed, in no acute distress. HEENT: Conjunctivae and lids unremarkable. Cardiovascular: Regular rhythm.  Lungs: Normal work of breathing. Neurologic: No focal deficits.   Lab Results   Component Value Date   CREATININE 0.77 08/29/2020   BUN 16 08/29/2020   NA 143 08/29/2020   K 5.0 08/29/2020   CL 105 08/29/2020   CO2 23 08/29/2020   Lab Results  Component Value Date   ALT 20 08/29/2020   AST 15 08/29/2020   ALKPHOS 148 (H) 08/29/2020   BILITOT 0.4 08/29/2020   Lab Results  Component Value Date   HGBA1C 5.9 (H) 08/29/2020   HGBA1C 5.7 (H) 01/27/2020   HGBA1C 5.8 (H) 09/08/2019   Lab Results  Component Value Date   INSULIN 10.5 08/29/2020   INSULIN 22.2 01/27/2020   INSULIN 21.1 09/08/2019   Lab Results  Component Value Date   TSH 1.570 09/08/2019   Lab Results  Component Value Date   CHOL 178 08/29/2020   HDL 52 08/29/2020   LDLCALC 110 (H) 08/29/2020   TRIG 84 08/29/2020   Lab Results  Component Value Date   WBC 10.1 09/08/2019   HGB 15.1 09/08/2019   HCT 43.8 09/08/2019   MCV 87 09/08/2019   PLT 320 09/08/2019     Attestation Statements:   Reviewed by clinician on day of visit: allergies, medications, problem list, medical history, surgical history, family history, social history, and previous encounter notes.  Edmund Hilda, am acting as transcriptionist for Reuben Likes, MD.   I have reviewed the above documentation for accuracy and completeness, and I agree with the above. - Katherina Mires, MD

## 2020-11-07 ENCOUNTER — Other Ambulatory Visit (INDEPENDENT_AMBULATORY_CARE_PROVIDER_SITE_OTHER): Payer: Self-pay | Admitting: Family Medicine

## 2020-11-07 ENCOUNTER — Other Ambulatory Visit: Payer: Self-pay

## 2020-11-07 ENCOUNTER — Encounter (INDEPENDENT_AMBULATORY_CARE_PROVIDER_SITE_OTHER): Payer: Self-pay | Admitting: Family Medicine

## 2020-11-07 ENCOUNTER — Ambulatory Visit (INDEPENDENT_AMBULATORY_CARE_PROVIDER_SITE_OTHER): Payer: 59 | Admitting: Family Medicine

## 2020-11-07 VITALS — BP 132/70 | HR 87 | Temp 98.5°F | Ht 66.0 in | Wt 253.0 lb

## 2020-11-07 DIAGNOSIS — Z9189 Other specified personal risk factors, not elsewhere classified: Secondary | ICD-10-CM | POA: Diagnosis not present

## 2020-11-07 DIAGNOSIS — R632 Polyphagia: Secondary | ICD-10-CM | POA: Diagnosis not present

## 2020-11-07 DIAGNOSIS — I10 Essential (primary) hypertension: Secondary | ICD-10-CM

## 2020-11-07 DIAGNOSIS — E559 Vitamin D deficiency, unspecified: Secondary | ICD-10-CM

## 2020-11-07 DIAGNOSIS — Z6841 Body Mass Index (BMI) 40.0 and over, adult: Secondary | ICD-10-CM

## 2020-11-07 MED ORDER — OZEMPIC (0.25 OR 0.5 MG/DOSE) 2 MG/1.5ML ~~LOC~~ SOPN: 0.2500 mg | PEN_INJECTOR | SUBCUTANEOUS | 0 refills | Status: DC

## 2020-11-07 MED ORDER — VITAMIN D (ERGOCALCIFEROL) 1.25 MG (50000 UNIT) PO CAPS: 50000.0000 [IU] | ORAL_CAPSULE | ORAL | 0 refills | Status: DC

## 2020-11-07 MED ORDER — TELMISARTAN 20 MG PO TABS: 10.0000 mg | ORAL_TABLET | Freq: Every day | ORAL | 0 refills | Status: DC

## 2020-11-07 NOTE — Telephone Encounter (Signed)
Dr.Ukleja 

## 2020-11-07 NOTE — Telephone Encounter (Signed)
Working on [employer]

## 2020-11-08 ENCOUNTER — Encounter (INDEPENDENT_AMBULATORY_CARE_PROVIDER_SITE_OTHER): Payer: Self-pay | Admitting: Family Medicine

## 2020-11-08 NOTE — Progress Notes (Signed)
Chief Complaint:   OBESITY Tiffany Norton is here to discuss her progress with her obesity treatment plan along with follow-up of her obesity related diagnoses. Tiffany Norton is on the Category 3 Plan and states she is following her eating plan approximately 80% of the time. Tiffany Norton states she is doing 0 minutes 0 times per week.  Today's visit was #: 23 Starting weight: 290 lbs Starting date: 09/08/2019 Today's weight: 253 lbs Today's date: 11/07/2020 Total lbs lost to date: 37 lbs Total lbs lost since last in-office visit: 0  Interim History: Tiffany Norton voices that she occasionally eats off the plan and food she knows she indulges on. She is a stress eater and her niece just recently had a breast cancer scare. She has no plans for the next few weeks. Pt does not have the ability to exercise due to knee pain.  Subjective:   1. Essential hypertension Tiffany Norton's BP is controlled today.  She denies chest pain, chest pressure and headache. Pt is on telmisartan.  BP Readings from Last 3 Encounters:  11/07/20 132/70  10/18/20 130/80  09/27/20 116/79    2. Vitamin D deficiency Tiffany Norton denies nausea, vomiting, and muscle weakness but notes fatigue. Pt is on prescription Vit D.  3. Polyphagia Tiffany Norton is still struggling with control of indulgent eating. Her last A1c was 5.9 and insulin level 10.5.   4. At risk for heart disease Tiffany Norton is at a higher than average risk for cardiovascular disease due to obesity.   Assessment/Plan:   1. Essential hypertension Tiffany Norton is working on healthy weight loss and exercise to improve blood pressure control. We will watch for signs of hypotension as she continues her lifestyle modifications.  - telmisartan (MICARDIS) 20 MG tablet; Take 0.5 tablets (10 mg total) by mouth daily.  Dispense: 30 tablet; Refill: 0  2. Vitamin D deficiency Low Vitamin D level contributes to fatigue and are associated with obesity, breast, and colon cancer. She agrees to continue to take  prescription Vitamin D @50 ,000 IU every week and will follow-up for routine testing of Vitamin D, at least 2-3 times per year to avoid over-replacement.  - Vitamin D, Ergocalciferol, (DRISDOL) 1.25 MG (50000 UNIT) CAPS capsule; Take 1 capsule (50,000 Units total) by mouth every 7 (seven) days.  Dispense: 4 capsule; Refill: 0  3. Polyphagia Intensive lifestyle modifications are the first line treatment for this issue. We discussed several lifestyle modifications today and she will continue to work on diet, exercise and weight loss efforts. Orders and follow up as documented in patient record. Start Ozempic 0.25 mg, as per below.  Counseling . Polyphagia is excessive hunger. . Causes can include: low blood sugars, hypERthyroidism, PMS, lack of sleep, stress, insulin resistance, diabetes, certain medications, and diets that are deficient in protein and fiber.   - Semaglutide,0.25 or 0.5MG /DOS, (OZEMPIC, 0.25 OR 0.5 MG/DOSE,) 2 MG/1.5ML SOPN; Inject 0.25 mg into the skin once a week.  Dispense: 1.5 mL; Refill: 0  4. At risk for heart disease Tiffany Norton was given approximately 15 minutes of coronary artery disease prevention counseling today. She is 65 y.o. female and has risk factors for heart disease including obesity. We discussed intensive lifestyle modifications today with an emphasis on specific weight loss instructions and strategies.   Repetitive spaced learning was employed today to elicit superior memory formation and behavioral change.  5. Class 3 severe obesity with serious comorbidity and body mass index (BMI) of 40.0 to 44.9 in adult, unspecified obesity type (HCC) Tiffany Norton is  currently in the action stage of change. As such, her goal is to continue with weight loss efforts. She has agreed to the Category 3 Plan.   Exercise goals: As is  Behavioral modification strategies: increasing lean protein intake, meal planning and cooking strategies and keeping healthy foods in the  home.  Tiffany Norton has agreed to follow-up with our clinic in 3 weeks. She was informed of the importance of frequent follow-up visits to maximize her success with intensive lifestyle modifications for her multiple health conditions.   Objective:   Blood pressure 132/70, pulse 87, temperature 98.5 F (36.9 C), temperature source Oral, height 5\' 6"  (1.676 m), weight 253 lb (114.8 kg), SpO2 97 %. Body mass index is 40.84 kg/m.  General: Cooperative, alert, well developed, in no acute distress. HEENT: Conjunctivae and lids unremarkable. Cardiovascular: Regular rhythm.  Lungs: Normal work of breathing. Neurologic: No focal deficits.   Lab Results  Component Value Date   CREATININE 0.77 08/29/2020   BUN 16 08/29/2020   NA 143 08/29/2020   K 5.0 08/29/2020   CL 105 08/29/2020   CO2 23 08/29/2020   Lab Results  Component Value Date   ALT 20 08/29/2020   AST 15 08/29/2020   ALKPHOS 148 (H) 08/29/2020   BILITOT 0.4 08/29/2020   Lab Results  Component Value Date   HGBA1C 5.9 (H) 08/29/2020   HGBA1C 5.7 (H) 01/27/2020   HGBA1C 5.8 (H) 09/08/2019   Lab Results  Component Value Date   INSULIN 10.5 08/29/2020   INSULIN 22.2 01/27/2020   INSULIN 21.1 09/08/2019   Lab Results  Component Value Date   TSH 1.570 09/08/2019   Lab Results  Component Value Date   CHOL 178 08/29/2020   HDL 52 08/29/2020   LDLCALC 110 (H) 08/29/2020   TRIG 84 08/29/2020   Lab Results  Component Value Date   WBC 10.1 09/08/2019   HGB 15.1 09/08/2019   HCT 43.8 09/08/2019   MCV 87 09/08/2019   PLT 320 09/08/2019     Attestation Statements:   Reviewed by clinician on day of visit: allergies, medications, problem list, medical history, surgical history, family history, social history, and previous encounter notes.  09/10/2019, am acting as transcriptionist for Edmund Hilda, MD.   I have reviewed the above documentation for accuracy and completeness, and I agree with the above. -  Reuben Likes, MD

## 2020-11-08 NOTE — Telephone Encounter (Signed)
Waiting for [employer]

## 2020-11-13 ENCOUNTER — Other Ambulatory Visit (INDEPENDENT_AMBULATORY_CARE_PROVIDER_SITE_OTHER): Payer: Self-pay | Admitting: Family Medicine

## 2020-11-13 DIAGNOSIS — I10 Essential (primary) hypertension: Secondary | ICD-10-CM

## 2020-11-14 ENCOUNTER — Other Ambulatory Visit (INDEPENDENT_AMBULATORY_CARE_PROVIDER_SITE_OTHER): Payer: Self-pay | Admitting: Family Medicine

## 2020-11-14 DIAGNOSIS — R632 Polyphagia: Secondary | ICD-10-CM

## 2020-11-14 NOTE — Telephone Encounter (Signed)
Dr.Ukleja 

## 2020-11-16 ENCOUNTER — Other Ambulatory Visit (INDEPENDENT_AMBULATORY_CARE_PROVIDER_SITE_OTHER): Payer: Self-pay | Admitting: Family Medicine

## 2020-11-16 DIAGNOSIS — R632 Polyphagia: Secondary | ICD-10-CM

## 2020-11-28 ENCOUNTER — Ambulatory Visit (INDEPENDENT_AMBULATORY_CARE_PROVIDER_SITE_OTHER): Payer: 59 | Admitting: Family Medicine

## 2020-11-28 ENCOUNTER — Encounter (INDEPENDENT_AMBULATORY_CARE_PROVIDER_SITE_OTHER): Payer: Self-pay | Admitting: Family Medicine

## 2020-11-28 ENCOUNTER — Other Ambulatory Visit: Payer: Self-pay

## 2020-11-28 VITALS — BP 128/83 | HR 90 | Temp 98.3°F | Ht 66.0 in | Wt 254.0 lb

## 2020-11-28 DIAGNOSIS — Z9189 Other specified personal risk factors, not elsewhere classified: Secondary | ICD-10-CM

## 2020-11-28 DIAGNOSIS — R632 Polyphagia: Secondary | ICD-10-CM

## 2020-11-28 DIAGNOSIS — Z6841 Body Mass Index (BMI) 40.0 and over, adult: Secondary | ICD-10-CM

## 2020-11-28 DIAGNOSIS — I10 Essential (primary) hypertension: Secondary | ICD-10-CM

## 2020-11-28 MED ORDER — OZEMPIC (0.25 OR 0.5 MG/DOSE) 2 MG/1.5ML ~~LOC~~ SOPN: 0.2500 mg | PEN_INJECTOR | SUBCUTANEOUS | 0 refills | Status: DC

## 2020-11-29 ENCOUNTER — Encounter (INDEPENDENT_AMBULATORY_CARE_PROVIDER_SITE_OTHER): Payer: Self-pay | Admitting: Family Medicine

## 2020-11-29 ENCOUNTER — Telehealth (INDEPENDENT_AMBULATORY_CARE_PROVIDER_SITE_OTHER): Payer: Self-pay | Admitting: Emergency Medicine

## 2020-11-29 NOTE — Telephone Encounter (Signed)
Prior Auth( Initiated: Ozempic0.25 or 1.5 ml   (Key: [id number]) Ozempic (0.25 or 0.5 MG/DOSE) 2MG /1.5ML pen-injectors

## 2020-12-01 NOTE — Progress Notes (Signed)
Chief Complaint:   OBESITY Tiffany Norton is here to discuss her progress with her obesity treatment plan along with follow-up of her obesity related diagnoses. Chanley is on the Category 3 Plan and states she is following her eating plan approximately 70% of the time. Averleigh states she is not currently exercising.  Today's visit was #: 24 Starting weight: 290 lbs Starting date: 09/08/2019 Today's weight: 254 lbs Today's date: 11/28/2020 Total lbs lost to date: 36 lbs Total lbs lost since last in-office visit: 0  Interim History: Tiffany Norton is still waiting on decision whether not Ozempic will be approved. She voices she has been frustrated with the above and so was not as compliant on meal plan- may have had a few extra couple glasses of wine. Her niece has been doing better with cancer diagnosis. For snacks, she is doing protein bar and protein chips.  Subjective:   1. Polyphagia Tiffany Norton was previously on Metformin but pt couldn't tolerate GLP-1 prescription sent in.  2. Essential hypertension Tiffany Norton's BP is well controlled today. Pt denies chest pain, chest pressure and headache.  BP Readings from Last 3 Encounters:  11/28/20 128/83  11/07/20 132/70  10/18/20 130/80   3. At risk for diabetes mellitus Tiffany Norton is at higher than average risk for developing diabetes due to obesity.   Assessment/Plan:   1. Polyphagia Intensive lifestyle modifications are the first line treatment for this issue. We discussed several lifestyle modifications today and she will continue to work on diet, exercise and weight loss efforts. Orders and follow up as documented in patient record.   Counseling . Polyphagia is excessive hunger. . Causes can include: low blood sugars, hypERthyroidism, PMS, lack of sleep, stress, insulin resistance, diabetes, certain medications, and diets that are deficient in protein and fiber.   - Semaglutide,0.25 or 0.5MG /DOS, (OZEMPIC, 0.25 OR 0.5 MG/DOSE,) 2 MG/1.5ML SOPN;  Inject 0.25 mg into the skin once a week.  Dispense: 1.5 mL; Refill: 0  2. Essential hypertension Tiffany Norton is working on healthy weight loss and exercise to improve blood pressure control. We will watch for signs of hypotension as she continues her lifestyle modifications. Continue Micardis with no change for dose.  3. At risk for diabetes mellitus Tiffany Norton was given approximately 15 minutes of diabetes education and counseling today. We discussed intensive lifestyle modifications today with an emphasis on weight loss as well as increasing exercise and decreasing simple carbohydrates in her diet. We also reviewed medication options with an emphasis on risk versus benefit of those discussed.   Repetitive spaced learning was employed today to elicit superior memory formation and behavioral change.  4. Class 3 severe obesity with serious comorbidity and body mass index (BMI) of 45.0 to 49.9 in adult, unspecified obesity type (HCC) Tiffany Norton is currently in the action stage of change. As such, her goal is to continue with weight loss efforts. She has agreed to the Category 3 Plan.   Exercise goals: No exercise has been prescribed at this time.  Behavioral modification strategies: increasing lean protein intake, meal planning and cooking strategies, celebration eating strategies and planning for success.  Tiffany Norton has agreed to follow-up with our clinic in 2 weeks. She was informed of the importance of frequent follow-up visits to maximize her success with intensive lifestyle modifications for her multiple health conditions.   Objective:   Blood pressure 128/83, pulse 90, temperature 98.3 F (36.8 C), height 5\' 6"  (1.676 m), weight 254 lb (115.2 kg), SpO2 97 %. Body mass index is  41 kg/m.  General: Cooperative, alert, well developed, in no acute distress. HEENT: Conjunctivae and lids unremarkable. Cardiovascular: Regular rhythm.  Lungs: Normal work of breathing. Neurologic: No focal deficits.    Lab Results  Component Value Date   CREATININE 0.77 08/29/2020   BUN 16 08/29/2020   NA 143 08/29/2020   K 5.0 08/29/2020   CL 105 08/29/2020   CO2 23 08/29/2020   Lab Results  Component Value Date   ALT 20 08/29/2020   AST 15 08/29/2020   ALKPHOS 148 (H) 08/29/2020   BILITOT 0.4 08/29/2020   Lab Results  Component Value Date   HGBA1C 5.9 (H) 08/29/2020   HGBA1C 5.7 (H) 01/27/2020   HGBA1C 5.8 (H) 09/08/2019   Lab Results  Component Value Date   INSULIN 10.5 08/29/2020   INSULIN 22.2 01/27/2020   INSULIN 21.1 09/08/2019   Lab Results  Component Value Date   TSH 1.570 09/08/2019   Lab Results  Component Value Date   CHOL 178 08/29/2020   HDL 52 08/29/2020   LDLCALC 110 (H) 08/29/2020   TRIG 84 08/29/2020   Lab Results  Component Value Date   WBC 10.1 09/08/2019   HGB 15.1 09/08/2019   HCT 43.8 09/08/2019   MCV 87 09/08/2019   PLT 320 09/08/2019    Attestation Statements:   Reviewed by clinician on day of visit: allergies, medications, problem list, medical history, surgical history, family history, social history, and previous encounter notes.  Edmund Hilda, am acting as transcriptionist for Reuben Likes, MD.  I have reviewed the above documentation for accuracy and completeness, and I agree with the above. - Katherina Mires, MD

## 2020-12-04 ENCOUNTER — Telehealth (INDEPENDENT_AMBULATORY_CARE_PROVIDER_SITE_OTHER): Payer: Self-pay | Admitting: Emergency Medicine

## 2020-12-04 NOTE — Telephone Encounter (Signed)
Prior Auth; (DENIED:Ozempic)    [id number]. OZEMPIC INJ 2/1.5ML is denied for not meeting the prior authorization requirement(s).

## 2020-12-11 ENCOUNTER — Other Ambulatory Visit (INDEPENDENT_AMBULATORY_CARE_PROVIDER_SITE_OTHER): Payer: Self-pay | Admitting: Family Medicine

## 2020-12-11 DIAGNOSIS — I10 Essential (primary) hypertension: Secondary | ICD-10-CM

## 2020-12-11 NOTE — Telephone Encounter (Signed)
Dr.Ukleja 

## 2020-12-12 ENCOUNTER — Encounter (INDEPENDENT_AMBULATORY_CARE_PROVIDER_SITE_OTHER): Payer: Self-pay | Admitting: Family Medicine

## 2020-12-12 ENCOUNTER — Other Ambulatory Visit (INDEPENDENT_AMBULATORY_CARE_PROVIDER_SITE_OTHER): Payer: Self-pay | Admitting: Family Medicine

## 2020-12-12 ENCOUNTER — Other Ambulatory Visit: Payer: Self-pay

## 2020-12-12 ENCOUNTER — Ambulatory Visit (INDEPENDENT_AMBULATORY_CARE_PROVIDER_SITE_OTHER): Payer: 59 | Admitting: Family Medicine

## 2020-12-12 VITALS — BP 115/75 | HR 82 | Temp 98.2°F | Ht 66.0 in | Wt 253.0 lb

## 2020-12-12 DIAGNOSIS — Z6841 Body Mass Index (BMI) 40.0 and over, adult: Secondary | ICD-10-CM

## 2020-12-12 DIAGNOSIS — F3289 Other specified depressive episodes: Secondary | ICD-10-CM

## 2020-12-12 DIAGNOSIS — R632 Polyphagia: Secondary | ICD-10-CM

## 2020-12-12 DIAGNOSIS — E559 Vitamin D deficiency, unspecified: Secondary | ICD-10-CM

## 2020-12-12 DIAGNOSIS — I10 Essential (primary) hypertension: Secondary | ICD-10-CM

## 2020-12-12 DIAGNOSIS — Z9189 Other specified personal risk factors, not elsewhere classified: Secondary | ICD-10-CM

## 2020-12-12 MED ORDER — VITAMIN D (ERGOCALCIFEROL) 1.25 MG (50000 UNIT) PO CAPS: 50000.0000 [IU] | ORAL_CAPSULE | ORAL | 0 refills | Status: DC

## 2020-12-12 MED ORDER — TOPIRAMATE 25 MG PO TABS: 25.0000 mg | ORAL_TABLET | Freq: Every day | ORAL | 0 refills | Status: DC

## 2020-12-12 MED ORDER — VENLAFAXINE HCL 75 MG PO TABS
75.0000 mg | ORAL_TABLET | Freq: Every day | ORAL | 3 refills | Status: DC
Start: 2020-12-12 — End: 2021-06-20

## 2020-12-12 MED ORDER — PHENTERMINE HCL 8 MG PO TABS: 4.0000 mg | ORAL_TABLET | Freq: Every day | ORAL | 0 refills | Status: DC

## 2020-12-13 NOTE — Progress Notes (Signed)
Chief Complaint:   OBESITY Tiffany Norton is here to discuss her progress with her obesity treatment plan along with follow-up of her obesity related diagnoses. Tiffany Norton is on the Category 3 Plan and states she is following her eating plan approximately 85% of the time. Tiffany Norton states she is not currently exercising.  Today's visit was #: 25 Starting weight: 290 lbs Starting date: 09/08/2019 Today's weight: 253 lbs Today's date: 12/12/2020 Total lbs lost to date: 37 Total lbs lost since last in-office visit: 1  Interim History: Tiffany Norton has had a good 3 weeks. She voices she isn't the most organized and was doing a meal delivery service but doesn't feel it's as heathy as it could be. Every meal has starches/carbs. She thinks she is going to change to a different meal delivery service.  Subjective:   1. Polyphagia Zaya notes some increased propensity for carbohydrates. Insurance did not approve GLP-1. PDMP checked and no concerns on EKG 1 year ago.  2. Vitamin D deficiency Tiffany Norton denies nausea, vomiting, and muscle weakness but notes fatigue. Pt is on prescription Vit D.  3. Other depression Tiffany Norton denies suicidal or homicidal ideations. She is on Effexor.  4. At risk for side effect of medication Tiffany Norton is at risk for side effect of medication due to starting Qsymia.  Assessment/Plan:   1. Polyphagia Intensive lifestyle modifications are the first line treatment for this issue. We discussed several lifestyle modifications today and she will continue to work on diet, exercise and weight loss efforts. Orders and follow up as documented in patient record. Start phentermine 4 mg and topiramate 25 mg, as prescribed below.  Counseling . Polyphagia is excessive hunger. . Causes can include: low blood sugars, hypERthyroidism, PMS, lack of sleep, stress, insulin resistance, diabetes, certain medications, and diets that are deficient in protein and fiber.   - Phentermine HCl 8 MG TABS;  Take 4 mg by mouth daily.  Dispense: 30 tablet; Refill: 0 - topiramate (TOPAMAX) 25 MG tablet; Take 1 tablet (25 mg total) by mouth daily.  Dispense: 30 tablet; Refill: 0  2. Vitamin D deficiency Low Vitamin D level contributes to fatigue and are associated with obesity, breast, and colon cancer. She agrees to continue to take prescription Vitamin D @50 ,000 IU every week and will follow-up for routine testing of Vitamin D, at least 2-3 times per year to avoid over-replacement.  - Vitamin D, Ergocalciferol, (DRISDOL) 1.25 MG (50000 UNIT) CAPS capsule; Take 1 capsule (50,000 Units total) by mouth every 7 (seven) days.  Dispense: 4 capsule; Refill: 0  3. Other depression Behavior modification techniques were discussed today to help Dorette deal with her emotional/non-hunger eating behaviors.  Orders and follow up as documented in patient record.   - venlafaxine (EFFEXOR) 75 MG tablet; Take 1 tablet (75 mg total) by mouth daily.  Dispense: 90 tablet; Refill: 3  4. At risk for side effect of medication Tiffany Norton was given approximately 15 minutes of drug side effect counseling today.  We discussed side effect possibility and risk versus benefits. Tiffany Norton agreed to the medication and will contact this office if these side effects are intolerable.  Repetitive spaced learning was employed today to elicit superior memory formation and behavioral change.  5. Class 3 severe obesity with serious comorbidity and body mass index (BMI) of 45.0 to 49.9 in adult, unspecified obesity type (HCC) Tiffany Norton is currently in the action stage of change. As such, her goal is to continue with weight loss efforts. She has agreed  to the Category 3 Plan.   Exercise goals: No exercise has been prescribed at this time.  Behavioral modification strategies: increasing lean protein intake, meal planning and cooking strategies, keeping healthy foods in the home and planning for success.  Tiffany Norton has agreed to follow-up with our  clinic in 2-3 weeks. She was informed of the importance of frequent follow-up visits to maximize her success with intensive lifestyle modifications for her multiple health conditions.   Objective:   Blood pressure 115/75, pulse 82, temperature 98.2 F (36.8 C), height 5\' 6"  (1.676 m), weight 253 lb (114.8 kg), SpO2 97 %. Body mass index is 40.84 kg/m.  General: Cooperative, alert, well developed, in no acute distress. HEENT: Conjunctivae and lids unremarkable. Cardiovascular: Regular rhythm.  Lungs: Normal work of breathing. Neurologic: No focal deficits.   Lab Results  Component Value Date   CREATININE 0.77 08/29/2020   BUN 16 08/29/2020   NA 143 08/29/2020   K 5.0 08/29/2020   CL 105 08/29/2020   CO2 23 08/29/2020   Lab Results  Component Value Date   ALT 20 08/29/2020   AST 15 08/29/2020   ALKPHOS 148 (H) 08/29/2020   BILITOT 0.4 08/29/2020   Lab Results  Component Value Date   HGBA1C 5.9 (H) 08/29/2020   HGBA1C 5.7 (H) 01/27/2020   HGBA1C 5.8 (H) 09/08/2019   Lab Results  Component Value Date   INSULIN 10.5 08/29/2020   INSULIN 22.2 01/27/2020   INSULIN 21.1 09/08/2019   Lab Results  Component Value Date   TSH 1.570 09/08/2019   Lab Results  Component Value Date   CHOL 178 08/29/2020   HDL 52 08/29/2020   LDLCALC 110 (H) 08/29/2020   TRIG 84 08/29/2020   Lab Results  Component Value Date   WBC 10.1 09/08/2019   HGB 15.1 09/08/2019   HCT 43.8 09/08/2019   MCV 87 09/08/2019   PLT 320 09/08/2019   No results found for: IRON, TIBC, FERRITIN   Attestation Statements:   Reviewed by clinician on day of visit: allergies, medications, problem list, medical history, surgical history, family history, social history, and previous encounter notes.  09/10/2019, am acting as transcriptionist for Edmund Hilda, MD.   I have reviewed the above documentation for accuracy and completeness, and I agree with the above. - Reuben Likes,  MD

## 2020-12-31 ENCOUNTER — Other Ambulatory Visit (INDEPENDENT_AMBULATORY_CARE_PROVIDER_SITE_OTHER): Payer: Self-pay | Admitting: Family Medicine

## 2020-12-31 DIAGNOSIS — R632 Polyphagia: Secondary | ICD-10-CM

## 2020-12-31 DIAGNOSIS — E559 Vitamin D deficiency, unspecified: Secondary | ICD-10-CM

## 2021-01-04 ENCOUNTER — Encounter (INDEPENDENT_AMBULATORY_CARE_PROVIDER_SITE_OTHER): Payer: Self-pay | Admitting: Family Medicine

## 2021-01-04 ENCOUNTER — Ambulatory Visit (INDEPENDENT_AMBULATORY_CARE_PROVIDER_SITE_OTHER): Payer: 59 | Admitting: Family Medicine

## 2021-01-04 ENCOUNTER — Other Ambulatory Visit: Payer: Self-pay

## 2021-01-04 VITALS — BP 118/73 | HR 89 | Temp 98.6°F | Ht 65.0 in | Wt 253.0 lb

## 2021-01-04 DIAGNOSIS — Z9189 Other specified personal risk factors, not elsewhere classified: Secondary | ICD-10-CM

## 2021-01-04 DIAGNOSIS — R632 Polyphagia: Secondary | ICD-10-CM

## 2021-01-04 DIAGNOSIS — E559 Vitamin D deficiency, unspecified: Secondary | ICD-10-CM | POA: Diagnosis not present

## 2021-01-04 DIAGNOSIS — Z6841 Body Mass Index (BMI) 40.0 and over, adult: Secondary | ICD-10-CM

## 2021-01-04 MED ORDER — TOPIRAMATE 50 MG PO TABS
50.0000 mg | ORAL_TABLET | Freq: Every day | ORAL | 0 refills | Status: DC
Start: 2021-01-04 — End: 2021-01-30

## 2021-01-04 MED ORDER — PHENTERMINE HCL 8 MG PO TABS: 4.0000 mg | ORAL_TABLET | Freq: Every day | ORAL | 0 refills | Status: DC

## 2021-01-04 MED ORDER — VITAMIN D (ERGOCALCIFEROL) 1.25 MG (50000 UNIT) PO CAPS: 50000.0000 [IU] | ORAL_CAPSULE | ORAL | 0 refills | Status: DC

## 2021-01-08 NOTE — Progress Notes (Signed)
Chief Complaint:   OBESITY Tiffany Norton is here to discuss her progress with her obesity treatment plan along with follow-up of her obesity related diagnoses. Tiffany Norton is on the Category 2 Plan and states she is following her eating plan approximately 85% of the time. Tiffany Norton states she is not currently exercising.  Today's visit was #: 26 Starting weight: 290 lbs Starting date: 09/08/2019 Today's weight: 253 lbs Today's date: 01/04/2021 Total lbs lost to date: 37 Total lbs lost since last in-office visit: 0  Interim History: Tiffany Norton had a good Mother's Day. She voices she has been following Category 2, but looking at chart she is supposed to be on category 3. She mentions she isn't measuring or weighing food. Protein chips, two 3 oz packs of tuna. She is doing delivered meals for dinner. She is using built bars that are 130 calories for snacks. Breakfast and lunch are not issues.  Subjective:   1. Vitamin D deficiency Tiffany Norton denies nausea, vomiting, and muscle weakness but notes fatigue. Pt is on prescription Vit D. Her last Vit D level was 36.3  2. Polyphagia Tiffany Norton is on Phentermine/Topamax combo. BP is well controlled with no side effects of medications.  3. At risk for side effect of medication Tiffany Norton is at risk for side effects of medication due to increasing dosage of Phentermine and Topamax.  Assessment/Plan:   1. Vitamin D deficiency Low Vitamin D level contributes to fatigue and are associated with obesity, breast, and colon cancer. She agrees to continue to take prescription Vitamin D @50 ,000 IU every week and will follow-up for routine testing of Vitamin D, at least 2-3 times per year to avoid over-replacement. - Vitamin D, Ergocalciferol, (DRISDOL) 1.25 MG (50000 UNIT) CAPS capsule; Take 1 capsule (50,000 Units total) by mouth every 7 (seven) days.  Dispense: 4 capsule; Refill: 0  2. Polyphagia Intensive lifestyle modifications are the first line treatment for this  issue. We discussed several lifestyle modifications today and she will continue to work on diet, exercise and weight loss efforts. Orders and follow up as documented in patient record.  Counseling . Polyphagia is excessive hunger. . Causes can include: low blood sugars, hypERthyroidism, PMS, lack of sleep, stress, insulin resistance, diabetes, certain medications, and diets that are deficient in protein and fiber.   Increase Phentermine to 8 mg QD and increase Topamax to 50 mg QD. - Phentermine HCl 8 MG TABS; Take 4 mg by mouth daily.  Dispense: 30 tablet; Refill: 0 - topiramate (TOPAMAX) 50 MG tablet; Take 1 tablet (50 mg total) by mouth daily.  Dispense: 30 tablet; Refill: 0  3. At risk for side effect of medication Tiffany Norton was given approximately 15 minutes of drug side effect counseling today.  We discussed side effect possibility and risk versus benefits. Tiffany Norton agreed to the medication and will contact this office if these side effects are intolerable.  Repetitive spaced learning was employed today to elicit superior memory formation and behavioral change.  4. Class 3 severe obesity with serious comorbidity and body mass index (BMI) of 45.0 to 49.9 in adult, unspecified obesity type (HCC) Tiffany Norton is currently in the action stage of change. As such, her goal is to continue with weight loss efforts. She has agreed to the Category 3 Plan with 8 oz at dinner and keeping a food journal and adhering to recommended goals of 400-550 calories and 40+ grams protein with supper.   Exercise goals: No exercise has been prescribed at this time.  Behavioral  modification strategies: increasing lean protein intake, meal planning and cooking strategies, keeping healthy foods in the home and planning for success.  Tiffany Norton has agreed to follow-up with our clinic in 3 weeks. She was informed of the importance of frequent follow-up visits to maximize her success with intensive lifestyle modifications for her  multiple health conditions.   Objective:   Blood pressure 118/73, pulse 89, temperature 98.6 F (37 C), height 5\' 5"  (1.651 m), weight 253 lb (114.8 kg), SpO2 97 %. Body mass index is 42.1 kg/m.  General: Cooperative, alert, well developed, in no acute distress. HEENT: Conjunctivae and lids unremarkable. Cardiovascular: Regular rhythm.  Lungs: Normal work of breathing. Neurologic: No focal deficits.   Lab Results  Component Value Date   CREATININE 0.77 08/29/2020   BUN 16 08/29/2020   NA 143 08/29/2020   K 5.0 08/29/2020   CL 105 08/29/2020   CO2 23 08/29/2020   Lab Results  Component Value Date   ALT 20 08/29/2020   AST 15 08/29/2020   ALKPHOS 148 (H) 08/29/2020   BILITOT 0.4 08/29/2020   Lab Results  Component Value Date   HGBA1C 5.9 (H) 08/29/2020   HGBA1C 5.7 (H) 01/27/2020   HGBA1C 5.8 (H) 09/08/2019   Lab Results  Component Value Date   INSULIN 10.5 08/29/2020   INSULIN 22.2 01/27/2020   INSULIN 21.1 09/08/2019   Lab Results  Component Value Date   TSH 1.570 09/08/2019   Lab Results  Component Value Date   CHOL 178 08/29/2020   HDL 52 08/29/2020   LDLCALC 110 (H) 08/29/2020   TRIG 84 08/29/2020   Lab Results  Component Value Date   WBC 10.1 09/08/2019   HGB 15.1 09/08/2019   HCT 43.8 09/08/2019   MCV 87 09/08/2019   PLT 320 09/08/2019   No results found for: IRON, TIBC, FERRITIN   Attestation Statements:   Reviewed by clinician on day of visit: allergies, medications, problem list, medical history, surgical history, family history, social history, and previous encounter notes.  09/10/2019, CMA, am acting as transcriptionist for Edmund Hilda, MD.   I have reviewed the above documentation for accuracy and completeness, and I agree with the above. - Reuben Likes, MD

## 2021-01-21 ENCOUNTER — Other Ambulatory Visit (INDEPENDENT_AMBULATORY_CARE_PROVIDER_SITE_OTHER): Payer: Self-pay | Admitting: Family Medicine

## 2021-01-21 DIAGNOSIS — E559 Vitamin D deficiency, unspecified: Secondary | ICD-10-CM

## 2021-01-21 DIAGNOSIS — R632 Polyphagia: Secondary | ICD-10-CM

## 2021-01-22 ENCOUNTER — Other Ambulatory Visit (INDEPENDENT_AMBULATORY_CARE_PROVIDER_SITE_OTHER): Payer: Self-pay | Admitting: Family Medicine

## 2021-01-22 DIAGNOSIS — I10 Essential (primary) hypertension: Secondary | ICD-10-CM

## 2021-01-23 NOTE — Telephone Encounter (Signed)
Dr.Ukleja 

## 2021-01-27 ENCOUNTER — Encounter (INDEPENDENT_AMBULATORY_CARE_PROVIDER_SITE_OTHER): Payer: Self-pay

## 2021-01-28 ENCOUNTER — Encounter (INDEPENDENT_AMBULATORY_CARE_PROVIDER_SITE_OTHER): Payer: Self-pay | Admitting: Family Medicine

## 2021-01-29 NOTE — Telephone Encounter (Signed)
Tiffany Norton, we can refill it tomorrow at your visit with Dr. Earlene Plater.

## 2021-01-30 ENCOUNTER — Ambulatory Visit (INDEPENDENT_AMBULATORY_CARE_PROVIDER_SITE_OTHER): Payer: 59 | Admitting: Family Medicine

## 2021-01-30 ENCOUNTER — Other Ambulatory Visit: Payer: Self-pay

## 2021-01-30 ENCOUNTER — Encounter (INDEPENDENT_AMBULATORY_CARE_PROVIDER_SITE_OTHER): Payer: Self-pay | Admitting: Family Medicine

## 2021-01-30 VITALS — BP 121/77 | HR 81 | Temp 98.4°F | Ht 65.0 in | Wt 247.0 lb

## 2021-01-30 DIAGNOSIS — I10 Essential (primary) hypertension: Secondary | ICD-10-CM

## 2021-01-30 DIAGNOSIS — R632 Polyphagia: Secondary | ICD-10-CM | POA: Diagnosis not present

## 2021-01-30 DIAGNOSIS — E559 Vitamin D deficiency, unspecified: Secondary | ICD-10-CM | POA: Diagnosis not present

## 2021-01-30 DIAGNOSIS — Z6841 Body Mass Index (BMI) 40.0 and over, adult: Secondary | ICD-10-CM

## 2021-01-30 DIAGNOSIS — Z9189 Other specified personal risk factors, not elsewhere classified: Secondary | ICD-10-CM

## 2021-01-30 MED ORDER — VITAMIN D (ERGOCALCIFEROL) 1.25 MG (50000 UNIT) PO CAPS: 50000.0000 [IU] | ORAL_CAPSULE | ORAL | 0 refills | Status: DC

## 2021-01-30 MED ORDER — PHENTERMINE HCL 8 MG PO TABS: 4.0000 mg | ORAL_TABLET | Freq: Every day | ORAL | 0 refills | Status: DC

## 2021-01-30 MED ORDER — TOPIRAMATE 50 MG PO TABS: 50.0000 mg | ORAL_TABLET | Freq: Every day | ORAL | 0 refills | Status: DC

## 2021-01-30 MED ORDER — TELMISARTAN 20 MG PO TABS: 10.0000 mg | ORAL_TABLET | Freq: Every day | ORAL | 1 refills | Status: DC

## 2021-02-01 ENCOUNTER — Other Ambulatory Visit (INDEPENDENT_AMBULATORY_CARE_PROVIDER_SITE_OTHER): Payer: Self-pay | Admitting: Family Medicine

## 2021-02-01 DIAGNOSIS — R632 Polyphagia: Secondary | ICD-10-CM

## 2021-02-04 ENCOUNTER — Encounter (INDEPENDENT_AMBULATORY_CARE_PROVIDER_SITE_OTHER): Payer: Self-pay | Admitting: Family Medicine

## 2021-02-04 DIAGNOSIS — R632 Polyphagia: Secondary | ICD-10-CM

## 2021-02-05 MED ORDER — PHENTERMINE HCL 8 MG PO TABS: 8.0000 mg | ORAL_TABLET | Freq: Every day | ORAL | 0 refills | Status: DC

## 2021-02-05 NOTE — Progress Notes (Signed)
Chief Complaint:   OBESITY Tiffany Norton is here to discuss her progress with her obesity treatment plan along with follow-up of her obesity related diagnoses. See Medical Weight Management Flowsheet for bioelectrical impedance results.  Today's visit was #: 27 Starting weight: 290 lbs Starting date: 09/08/2019 Today's weight: 247 lbs Today's date: 01/30/2021 Weight change since last visit: 6 lbs Total lbs lost to date: 43 lbs Body mass index is 41.1 kg/m.  Total weight loss percentage to date: -14.83%  Interim History: Tiffany Norton is doing well.  She is meeting her protein goals.  Denies polyphagia with medication. Nutrition Plan: the Category 3 Plan for 90% of the time. Activity:  Increased activity. Anti-obesity medications: phentermine  mg daily. Reported side effects: None.  Assessment/Plan:   1. Essential hypertension At goal. Medications: telmisartan 10 mg daily.   Plan: Avoid buying foods that are: processed, frozen, or prepackaged to avoid excess salt. We will watch for signs of hypotension as she continues lifestyle modifications.  BP Readings from Last 3 Encounters:  01/30/21 121/77  01/04/21 118/73  12/12/20 115/75   Lab Results  Component Value Date   CREATININE 0.77 08/29/2020   - Refill telmisartan (MICARDIS) 20 MG tablet; Take 0.5 tablets (10 mg total) by mouth daily.  Dispense: 15 tablet; Refill: 1  2. Polyphagia Controlled. Current treatment: phentermine 4 mg daily. Polyphagia refers to excessive feelings of hunger. She will continue to focus on protein-rich, low simple carbohydrate foods. We reviewed the importance of hydration, regular exercise for stress reduction, and restorative sleep.  - Refill topiramate (TOPAMAX) 50 MG tablet; Take 1 tablet (50 mg total) by mouth daily.  Dispense: 30 tablet; Refill: 0 - Refill Phentermine HCl 8 MG TABS; Take 8 mg by mouth daily.  Dispense: 30 tablet; Refill: 0  I have consulted the [place] Controlled Substances Registry  for this patient, and feel the risk/benefit ratio today is favorable for proceeding with this prescription for a controlled substance. The patient understands monitoring parameters and red flags.   3. Vitamin D deficiency Not at goal. Current vitamin D is 36.3, tested on 08/29/2020. Optimal goal > 50 ng/dL.  She is taking vitamin D 50,000 IU weekly.  Plan: Continue to take prescription Vitamin D @50 ,000 IU every week as prescribed.  Follow-up for routine testing of Vitamin D, at least 2-3 times per year to avoid over-replacement.  - Refill Vitamin D, Ergocalciferol, (DRISDOL) 1.25 MG (50000 UNIT) CAPS capsule; Take 1 capsule (50,000 Units total) by mouth every 7 (seven) days.  Dispense: 4 capsule; Refill: 0  4. At risk for heart disease Due to Oma's current state of health and medical condition(s), she is at a higher risk for heart disease.  This puts the patient at much greater risk to subsequently develop cardiopulmonary conditions that can significantly affect patient's quality of life in a negative manner.    At least 8 minutes were spent on counseling Tiffany Norton about these concerns today. Evidence-based interventions for health behavior change were utilized today including the discussion of self monitoring techniques, problem-solving barriers, and SMART goal setting techniques.  Specifically, regarding patient's less desirable eating habits and patterns, we employed the technique of small changes when Tiffany Norton has not been able to fully commit to her prudent nutritional plan.  5. Obesity, current BMI 41.1  Course: Tiffany Norton is currently in the action stage of change. As such, her goal is to continue with weight loss efforts.   Nutrition goals: She has agreed to the Category 3  Plan.   Exercise goals:  Increase activity.   Behavioral modification strategies: increasing lean protein intake, decreasing simple carbohydrates, increasing vegetables, and increasing water intake.  [name] has  agreed to follow-up with our clinic in 2-3 weeks with Dr. Lawson Radar. She was informed of the importance of frequent follow-up visits to maximize her success with intensive lifestyle modifications for her multiple health conditions.   Objective:   Blood pressure 121/77, pulse 81, temperature 98.4 F (36.9 C), temperature source Oral, height 5\' 5"  (1.651 m), weight 247 lb (112 kg), SpO2 94 %. Body mass index is 41.1 kg/m.  General: Cooperative, alert, well developed, in no acute distress. HEENT: Conjunctivae and lids unremarkable. Cardiovascular: Regular rhythm.  Lungs: Normal work of breathing. Neurologic: No focal deficits.   Lab Results  Component Value Date   CREATININE 0.77 08/29/2020   BUN 16 08/29/2020   NA 143 08/29/2020   K 5.0 08/29/2020   CL 105 08/29/2020   CO2 23 08/29/2020   Lab Results  Component Value Date   ALT 20 08/29/2020   AST 15 08/29/2020   ALKPHOS 148 (H) 08/29/2020   BILITOT 0.4 08/29/2020   Lab Results  Component Value Date   HGBA1C 5.9 (H) 08/29/2020   HGBA1C 5.7 (H) 01/27/2020   HGBA1C 5.8 (H) 09/08/2019   Lab Results  Component Value Date   INSULIN 10.5 08/29/2020   INSULIN 22.2 01/27/2020   INSULIN 21.1 09/08/2019   Lab Results  Component Value Date   TSH 1.570 09/08/2019   Lab Results  Component Value Date   CHOL 178 08/29/2020   HDL 52 08/29/2020   LDLCALC 110 (H) 08/29/2020   TRIG 84 08/29/2020   Lab Results  Component Value Date   WBC 10.1 09/08/2019   HGB 15.1 09/08/2019   HCT 43.8 09/08/2019   MCV 87 09/08/2019   PLT 320 09/08/2019   Attestation Statements:   Reviewed by clinician on day of visit: allergies, medications, problem list, medical history, surgical history, family history, social history, and previous encounter notes.  I, 09/10/2019, CMA, am acting as transcriptionist for Insurance claims handler, DO  I have reviewed the above documentation for accuracy and completeness, and I agree with the above. Helane Rima,  DO

## 2021-02-05 NOTE — Telephone Encounter (Signed)
Pt last seen by Dr. Wallace.  

## 2021-02-06 MED ORDER — PHENTERMINE HCL 8 MG PO TABS: 8.0000 mg | ORAL_TABLET | Freq: Every day | ORAL | 0 refills | Status: DC

## 2021-02-13 ENCOUNTER — Ambulatory Visit (INDEPENDENT_AMBULATORY_CARE_PROVIDER_SITE_OTHER): Payer: 59 | Admitting: Family Medicine

## 2021-02-13 ENCOUNTER — Encounter (INDEPENDENT_AMBULATORY_CARE_PROVIDER_SITE_OTHER): Payer: Self-pay | Admitting: Family Medicine

## 2021-02-13 ENCOUNTER — Other Ambulatory Visit: Payer: Self-pay

## 2021-02-13 VITALS — BP 126/83 | HR 81 | Temp 98.6°F | Ht 65.0 in | Wt 245.0 lb

## 2021-02-13 DIAGNOSIS — I10 Essential (primary) hypertension: Secondary | ICD-10-CM | POA: Diagnosis not present

## 2021-02-13 DIAGNOSIS — Z6841 Body Mass Index (BMI) 40.0 and over, adult: Secondary | ICD-10-CM

## 2021-02-13 DIAGNOSIS — E559 Vitamin D deficiency, unspecified: Secondary | ICD-10-CM

## 2021-02-14 ENCOUNTER — Ambulatory Visit (INDEPENDENT_AMBULATORY_CARE_PROVIDER_SITE_OTHER): Payer: 59 | Admitting: Family Medicine

## 2021-02-15 NOTE — Progress Notes (Signed)
Chief Complaint:   OBESITY Abigayl is here to discuss her progress with her obesity treatment plan along with follow-up of her obesity related diagnoses. Marlie is on the Category 3 Plan and states she is following her eating plan approximately 90% of the time. Shadaya states she is not currently exercising.  Today's visit was #: 28 Starting weight: 290 lbs Starting date: 09/08/2019 Today's weight: 245 lbs Today's date: 02/13/2021 Total lbs lost to date: 45 Total lbs lost since last in-office visit: 2  Interim History: the last few weeks, Tephanie has been sticking as close to the plan as she can. For the 4th of July she is having a cookout with her family. No upcoming plans. No obstacles foreseen to staying on plan. She realizes Qsymia is helping with controlling cravings.  Subjective:   1. Vitamin D deficiency Makalynn denies nausea, vomiting, and muscle weakness but notes fatigue. Pt is on prescription Vit D.  2. Essential hypertension Audree's BP is well controlled today. Pt denies chest pain/chest pressure/headache.  BP Readings from Last 3 Encounters:  02/13/21 126/83  01/30/21 121/77  01/04/21 118/73   Assessment/Plan:   1. Vitamin D deficiency Low Vitamin D level contributes to fatigue and are associated with obesity, breast, and colon cancer. She agrees to continue to take prescription Vitamin D @50 ,000 IU every week and will follow-up for routine testing of Vitamin D, at least 2-3 times per year to avoid over-replacement.  2. Essential hypertension Ashtin is working on healthy weight loss and exercise to improve blood pressure control. We will watch for signs of hypotension as she continues her lifestyle modifications. Continue Micardis with no change in dose.  3. Class 3 severe obesity with serious comorbidity and body mass index (BMI) of 40.0 to 44.9 in adult, unspecified obesity type (HCC)  Abisai is currently in the action stage of change. As such, her goal  is to continue with weight loss efforts. She has agreed to the Category 3 Plan.   Exercise goals: No exercise has been prescribed at this time.  Behavioral modification strategies: increasing lean protein intake, meal planning and cooking strategies, and keeping healthy foods in the home.  Jorryn has agreed to follow-up with our clinic in 2-3 weeks. She was informed of the importance of frequent follow-up visits to maximize her success with intensive lifestyle modifications for her multiple health conditions.   Objective:   Blood pressure 126/83, pulse 81, temperature 98.6 F (37 C), height 5\' 5"  (1.651 m), weight 245 lb (111.1 kg), SpO2 96 %. Body mass index is 40.77 kg/m.  General: Cooperative, alert, well developed, in no acute distress. HEENT: Conjunctivae and lids unremarkable. Cardiovascular: Regular rhythm.  Lungs: Normal work of breathing. Neurologic: No focal deficits.   Lab Results  Component Value Date   CREATININE 0.77 08/29/2020   BUN 16 08/29/2020   NA 143 08/29/2020   K 5.0 08/29/2020   CL 105 08/29/2020   CO2 23 08/29/2020   Lab Results  Component Value Date   ALT 20 08/29/2020   AST 15 08/29/2020   ALKPHOS 148 (H) 08/29/2020   BILITOT 0.4 08/29/2020   Lab Results  Component Value Date   HGBA1C 5.9 (H) 08/29/2020   HGBA1C 5.7 (H) 01/27/2020   HGBA1C 5.8 (H) 09/08/2019   Lab Results  Component Value Date   INSULIN 10.5 08/29/2020   INSULIN 22.2 01/27/2020   INSULIN 21.1 09/08/2019   Lab Results  Component Value Date   TSH 1.570 09/08/2019  Lab Results  Component Value Date   CHOL 178 08/29/2020   HDL 52 08/29/2020   LDLCALC 110 (H) 08/29/2020   TRIG 84 08/29/2020   Lab Results  Component Value Date   VD25OH 36.3 08/29/2020   VD25OH 40.9 01/27/2020   VD25OH 23.9 (L) 09/08/2019   Lab Results  Component Value Date   WBC 10.1 09/08/2019   HGB 15.1 09/08/2019   HCT 43.8 09/08/2019   MCV 87 09/08/2019   PLT 320 09/08/2019   No  results found for: IRON, TIBC, FERRITIN   Attestation Statements:   Reviewed by clinician on day of visit: allergies, medications, problem list, medical history, surgical history, family history, social history, and previous encounter notes.  Time spent on visit including pre-visit chart review and post-visit care and charting was 15 minutes.   Edmund Hilda, CMA, am acting as transcriptionist for Reuben Likes, MD.  I have reviewed the above documentation for accuracy and completeness, and I agree with the above. - Katherina Mires, MD

## 2021-02-24 ENCOUNTER — Other Ambulatory Visit (INDEPENDENT_AMBULATORY_CARE_PROVIDER_SITE_OTHER): Payer: Self-pay | Admitting: Family Medicine

## 2021-02-24 DIAGNOSIS — R632 Polyphagia: Secondary | ICD-10-CM

## 2021-02-24 DIAGNOSIS — I10 Essential (primary) hypertension: Secondary | ICD-10-CM

## 2021-02-26 NOTE — Telephone Encounter (Signed)
Last OV with Dr Ukleja 

## 2021-02-27 ENCOUNTER — Encounter (INDEPENDENT_AMBULATORY_CARE_PROVIDER_SITE_OTHER): Payer: Self-pay | Admitting: Family Medicine

## 2021-02-27 ENCOUNTER — Ambulatory Visit (INDEPENDENT_AMBULATORY_CARE_PROVIDER_SITE_OTHER): Payer: 59 | Admitting: Family Medicine

## 2021-02-27 ENCOUNTER — Other Ambulatory Visit: Payer: Self-pay

## 2021-02-27 VITALS — BP 123/78 | HR 93 | Temp 98.2°F | Ht 65.0 in | Wt 244.0 lb

## 2021-02-27 DIAGNOSIS — E559 Vitamin D deficiency, unspecified: Secondary | ICD-10-CM | POA: Diagnosis not present

## 2021-02-27 DIAGNOSIS — Z6841 Body Mass Index (BMI) 40.0 and over, adult: Secondary | ICD-10-CM

## 2021-02-27 DIAGNOSIS — R632 Polyphagia: Secondary | ICD-10-CM | POA: Diagnosis not present

## 2021-02-27 DIAGNOSIS — I10 Essential (primary) hypertension: Secondary | ICD-10-CM | POA: Diagnosis not present

## 2021-02-27 DIAGNOSIS — Z9189 Other specified personal risk factors, not elsewhere classified: Secondary | ICD-10-CM

## 2021-02-27 MED ORDER — TELMISARTAN 20 MG PO TABS: 10.0000 mg | ORAL_TABLET | Freq: Every day | ORAL | 0 refills | Status: DC

## 2021-02-27 MED ORDER — VITAMIN D (ERGOCALCIFEROL) 1.25 MG (50000 UNIT) PO CAPS: 50000.0000 [IU] | ORAL_CAPSULE | ORAL | 0 refills | Status: DC

## 2021-03-04 ENCOUNTER — Other Ambulatory Visit (INDEPENDENT_AMBULATORY_CARE_PROVIDER_SITE_OTHER): Payer: Self-pay | Admitting: Family Medicine

## 2021-03-04 DIAGNOSIS — R632 Polyphagia: Secondary | ICD-10-CM

## 2021-03-05 ENCOUNTER — Encounter (INDEPENDENT_AMBULATORY_CARE_PROVIDER_SITE_OTHER): Payer: Self-pay

## 2021-03-05 NOTE — Telephone Encounter (Signed)
MyChart message sent to pt to find out if they have enough medication to get them through until next appt.   

## 2021-03-05 NOTE — Progress Notes (Signed)
Chief Complaint:   OBESITY Tiffany Norton is here to discuss her progress with her obesity treatment plan along with follow-up of her obesity related diagnoses. Tiffany Norton is on the Category 3 Plan and states she is following her eating plan approximately 85% of the time. Tiffany Norton states she is not currently exercising.  Today's visit was #: 29 Starting weight: 290 lbs Starting date: 09/08/2019 Today's weight: 244 lbs Today's date: 02/27/2021 Total lbs lost to date: 46 Total lbs lost since last in-office visit: 1  Interim History: Tiffany Norton had a gathering at her sister's for the 4th of July. She has been otherwise doing typical daily activities. She is doing well on meal plan. 2-3 nights a week, she has a glass of wine. She is doing coffee creamer or ice cream bar for snack calories. She denies hunger. Pt is still noticing satiety from Topamax and Phentermine.  Subjective:   1. Polyphagia Tiffany Norton is on Phentermine and Topamax. She reports good control of satiety.   2. Essential hypertension Tiffany Norton's BP is well controlled. Pt denies cardiac symptoms. She is on Micardis.  3. Vitamin D deficiency Tiffany Norton denies nausea, vomiting, and muscle weakness but notes fatigue. Pt is on prescription Vit D.  4. At risk for heart disease Tiffany Norton is at a higher than average risk for cardiovascular disease due to obesity.   Assessment/Plan:   1. Polyphagia Tiffany Norton will continue combo of medication. Intensive lifestyle modifications are the first line treatment for this issue. We discussed several lifestyle modifications today and she will continue to work on diet, exercise and weight loss efforts. Orders and follow up as documented in patient record.  Counseling Polyphagia is excessive hunger. Causes can include: low blood sugars, hypERthyroidism, PMS, lack of sleep, stress, insulin resistance, diabetes, certain medications, and diets that are deficient in protein and fiber.   2. Essential  hypertension Tiffany Norton is working on healthy weight loss and exercise to improve blood pressure control. We will watch for signs of hypotension as she continues her lifestyle modifications.  Refill- telmisartan (MICARDIS) 20 MG tablet; Take 0.5 tablets (10 mg total) by mouth daily.  Dispense: 30 tablet; Refill: 0  3. Vitamin D deficiency Low Vitamin D level contributes to fatigue and are associated with obesity, breast, and colon cancer. She agrees to continue to take prescription Vitamin D @50 ,000 IU every week and will follow-up for routine testing of Vitamin D, at least 2-3 times per year to avoid over-replacement.  Refill- Vitamin D, Ergocalciferol, (DRISDOL) 1.25 MG (50000 UNIT) CAPS capsule; Take 1 capsule (50,000 Units total) by mouth every 7 (seven) days.  Dispense: 4 capsule; Refill: 0  4. At risk for heart disease Tiffany Norton was given approximately 15 minutes of coronary artery disease prevention counseling today. She is 65 y.o. female and has risk factors for heart disease including obesity. We discussed intensive lifestyle modifications today with an emphasis on specific weight loss instructions and strategies.   Repetitive spaced learning was employed today to elicit superior memory formation and behavioral change.  5. Class 3 severe obesity with serious comorbidity and body mass index (BMI) of 40.0 to 44.9 in adult, unspecified obesity type (HCC)  Tiffany Norton is currently in the action stage of change. As such, her goal is to continue with weight loss efforts. She has agreed to the Category 3 Plan.   Exercise goals: No exercise has been prescribed at this time.  Behavioral modification strategies: increasing lean protein intake, meal planning and cooking strategies, keeping healthy foods in  the home, and planning for success.  Tiffany Norton has agreed to follow-up with our clinic in 2-3 weeks. She was informed of the importance of frequent follow-up visits to maximize her success with intensive  lifestyle modifications for her multiple health conditions.   Objective:   Blood pressure 123/78, pulse 93, temperature 98.2 F (36.8 C), height 5\' 5"  (1.651 m), weight 244 lb (110.7 kg), SpO2 99 %. Body mass index is 40.6 kg/m.  General: Cooperative, alert, well developed, in no acute distress. HEENT: Conjunctivae and lids unremarkable. Cardiovascular: Regular rhythm.  Lungs: Normal work of breathing. Neurologic: No focal deficits.   Lab Results  Component Value Date   CREATININE 0.77 08/29/2020   BUN 16 08/29/2020   NA 143 08/29/2020   K 5.0 08/29/2020   CL 105 08/29/2020   CO2 23 08/29/2020   Lab Results  Component Value Date   ALT 20 08/29/2020   AST 15 08/29/2020   ALKPHOS 148 (H) 08/29/2020   BILITOT 0.4 08/29/2020   Lab Results  Component Value Date   HGBA1C 5.9 (H) 08/29/2020   HGBA1C 5.7 (H) 01/27/2020   HGBA1C 5.8 (H) 09/08/2019   Lab Results  Component Value Date   INSULIN 10.5 08/29/2020   INSULIN 22.2 01/27/2020   INSULIN 21.1 09/08/2019   Lab Results  Component Value Date   TSH 1.570 09/08/2019   Lab Results  Component Value Date   CHOL 178 08/29/2020   HDL 52 08/29/2020   LDLCALC 110 (H) 08/29/2020   TRIG 84 08/29/2020   Lab Results  Component Value Date   VD25OH 36.3 08/29/2020   VD25OH 40.9 01/27/2020   VD25OH 23.9 (L) 09/08/2019   Lab Results  Component Value Date   WBC 10.1 09/08/2019   HGB 15.1 09/08/2019   HCT 43.8 09/08/2019   MCV 87 09/08/2019   PLT 320 09/08/2019    Attestation Statements:   Reviewed by clinician on day of visit: allergies, medications, problem list, medical history, surgical history, family history, social history, and previous encounter notes.  09/10/2019, CMA, am acting as transcriptionist for Edmund Hilda, MD.   I have reviewed the above documentation for accuracy and completeness, and I agree with the above. - Reuben Likes, MD

## 2021-03-10 ENCOUNTER — Other Ambulatory Visit (INDEPENDENT_AMBULATORY_CARE_PROVIDER_SITE_OTHER): Payer: Self-pay | Admitting: Family Medicine

## 2021-03-10 DIAGNOSIS — R632 Polyphagia: Secondary | ICD-10-CM

## 2021-03-19 ENCOUNTER — Encounter (INDEPENDENT_AMBULATORY_CARE_PROVIDER_SITE_OTHER): Payer: Self-pay | Admitting: Family Medicine

## 2021-03-19 ENCOUNTER — Ambulatory Visit (INDEPENDENT_AMBULATORY_CARE_PROVIDER_SITE_OTHER): Payer: 59 | Admitting: Family Medicine

## 2021-03-19 ENCOUNTER — Other Ambulatory Visit: Payer: Self-pay

## 2021-03-19 VITALS — BP 117/75 | HR 73 | Temp 98.2°F | Ht 66.0 in | Wt 242.0 lb

## 2021-03-19 DIAGNOSIS — R632 Polyphagia: Secondary | ICD-10-CM

## 2021-03-19 DIAGNOSIS — Z9189 Other specified personal risk factors, not elsewhere classified: Secondary | ICD-10-CM

## 2021-03-19 DIAGNOSIS — I10 Essential (primary) hypertension: Secondary | ICD-10-CM

## 2021-03-19 DIAGNOSIS — R7303 Prediabetes: Secondary | ICD-10-CM

## 2021-03-19 DIAGNOSIS — E559 Vitamin D deficiency, unspecified: Secondary | ICD-10-CM | POA: Diagnosis not present

## 2021-03-19 DIAGNOSIS — Z6841 Body Mass Index (BMI) 40.0 and over, adult: Secondary | ICD-10-CM

## 2021-03-19 MED ORDER — TOPIRAMATE 50 MG PO TABS: 50.0000 mg | ORAL_TABLET | Freq: Every day | ORAL | 0 refills | Status: DC

## 2021-03-19 MED ORDER — TELMISARTAN 20 MG PO TABS: 10.0000 mg | ORAL_TABLET | Freq: Every day | ORAL | 0 refills | Status: DC

## 2021-03-19 MED ORDER — PHENTERMINE HCL 8 MG PO TABS: 8.0000 mg | ORAL_TABLET | Freq: Every day | ORAL | 0 refills | Status: DC

## 2021-03-20 LAB — COMPREHENSIVE METABOLIC PANEL
ALT: 20 IU/L (ref 0–32)
AST: 13 IU/L (ref 0–40)
Albumin/Globulin Ratio: 2.6 — ABNORMAL HIGH (ref 1.2–2.2)
Albumin: 4.7 g/dL (ref 3.8–4.8)
Alkaline Phosphatase: 150 IU/L — ABNORMAL HIGH (ref 44–121)
BUN/Creatinine Ratio: 22 (ref 12–28)
BUN: 22 mg/dL (ref 8–27)
Bilirubin Total: 0.4 mg/dL (ref 0.0–1.2)
CO2: 22 mmol/L (ref 20–29)
Calcium: 10.5 mg/dL — ABNORMAL HIGH (ref 8.7–10.3)
Chloride: 109 mmol/L — ABNORMAL HIGH (ref 96–106)
Creatinine, Ser: 0.98 mg/dL (ref 0.57–1.00)
Globulin, Total: 1.8 g/dL (ref 1.5–4.5)
Glucose: 98 mg/dL (ref 65–99)
Potassium: 4.8 mmol/L (ref 3.5–5.2)
Sodium: 144 mmol/L (ref 134–144)
Total Protein: 6.5 g/dL (ref 6.0–8.5)
eGFR: 65 mL/min/{1.73_m2} (ref >59)

## 2021-03-20 LAB — HEMOGLOBIN A1C
Est. average glucose Bld gHb Est-mCnc: 117 mg/dL
Hgb A1c MFr Bld: 5.7 % — ABNORMAL HIGH (ref 4.8–5.6)

## 2021-03-20 LAB — INSULIN, RANDOM: INSULIN: 16.4 u[IU]/mL (ref 2.6–24.9)

## 2021-03-20 LAB — LIPID PANEL WITH LDL/HDL RATIO
Cholesterol, Total: 165 mg/dL (ref 100–199)
HDL: 47 mg/dL (ref >39)
LDL Chol Calc (NIH): 101 mg/dL — ABNORMAL HIGH (ref 0–99)
LDL/HDL Ratio: 2.1 ratio (ref 0.0–3.2)
Triglycerides: 89 mg/dL (ref 0–149)
VLDL Cholesterol Cal: 17 mg/dL (ref 5–40)

## 2021-03-20 LAB — VITAMIN D 25 HYDROXY (VIT D DEFICIENCY, FRACTURES): Vit D, 25-Hydroxy: 49.9 ng/mL (ref 30.0–100.0)

## 2021-03-20 NOTE — Progress Notes (Signed)
Chief Complaint:   OBESITY Tiffany Norton is here to discuss her progress with her obesity treatment plan along with follow-up of her obesity related diagnoses. Tiffany Norton is on the Category 3 Plan and states she is following her eating plan approximately 85% of the time. Tiffany Norton states she is not currently exercising.  Today's visit was #: 30 Starting weight: 290 lbs Starting date: 09/08/2019 Today's weight: 242 lbs Today's date: 03/19/2021 Total lbs lost to date: 48 Total lbs lost since last in-office visit: 2  Interim History: Tiffany Norton hasn't scheduled an appt with ortho yet and wanted to ensure her BMI is less than 40. She is going on vacation to [facility] this month for 1 week. She is able to eat all food on plan and has limited sugar cravings with Phentermine/topiramate combo. She is drinking wine for snack calories.  Subjective:   1. Essential hypertension Tiffany Norton's BP is controlled on Micardis 10 mg (1/2 tab).  2. Tiffany Norton is on combo Phentermine/topiramate. No side effects noted.  3. Vitamin D deficiency Tiffany Norton denies nausea, vomiting, and muscle weakness but notes fatigue. She is on prescription Vit D.  4. Pre-diabetes Tiffany Norton's A1c was previously 5.7. She is not on medication.  5. At risk for activity intolerance Tiffany Norton is at risk for exercise intolerance due to knee osteoarthritis.  Assessment/Plan:   1. Essential hypertension Tiffany Norton is working on healthy weight loss and exercise to improve blood pressure control. We will watch for signs of hypotension as she continues her lifestyle modifications. Check labs today.  Refill- telmisartan (MICARDIS) 20 MG tablet; Take 0.5 tablets (10 mg total) by mouth daily.  Dispense: 45 tablet; Refill: 0  - Comprehensive metabolic panel - Lipid Panel With LDL/HDL Ratio  2. Polyphagia Intensive lifestyle modifications are the first line treatment for this issue. We discussed several lifestyle modifications today and she  will continue to work on diet, exercise and weight loss efforts. Orders and follow up as documented in patient record.  Counseling Polyphagia is excessive hunger. Causes can include: low blood sugars, hypERthyroidism, PMS, lack of sleep, stress, insulin resistance, diabetes, certain medications, and diets that are deficient in protein and fiber.   Refill- Phentermine HCl 8 MG TABS; Take 8 mg by mouth daily.  Dispense: 30 tablet; Refill: 0 Refill- topiramate (TOPAMAX) 50 MG tablet; Take 1 tablet (50 mg total) by mouth daily.  Dispense: 30 tablet; Refill: 0  3. Vitamin D deficiency Low Vitamin D level contributes to fatigue and are associated with obesity, breast, and colon cancer. She agrees to continue to take prescription Vitamin D @50 ,000 IU every week and will follow-up for routine testing of Vitamin D, at least 2-3 times per year to avoid over-replacement. Check labs today.  - VITAMIN D 25 Hydroxy (Vit-D Deficiency, Fractures)  4. Pre-diabetes Tiffany Norton will continue to work on weight loss, exercise, and decreasing simple carbohydrates to help decrease the risk of diabetes.  Check labs today.  - Hemoglobin A1c - Insulin, random  5. At risk for activity intolerance Tiffany Norton was given approximately 15 minutes of exercise intolerance counseling today. She is 65 y.o. female and has risk factors exercise intolerance including obesity. We discussed intensive lifestyle modifications today with an emphasis on specific weight loss instructions and strategies. Tiffany Norton will slowly increase activity as tolerated.  Repetitive spaced learning was employed today to elicit superior memory formation and behavioral change.   6. Obesity with current BMI of 39.1  Tiffany Norton is currently in the action stage of change.  As such, her goal is to continue with weight loss efforts. She has agreed to the Category 3 Plan.   Exercise goals: No exercise has been prescribed at this time.  Behavioral modification  strategies: increasing lean protein intake, meal planning and cooking strategies, keeping healthy foods in the home, and planning for success.  Tiffany Norton has agreed to follow-up with our clinic in 2-3 weeks. She was informed of the importance of frequent follow-up visits to maximize her success with intensive lifestyle modifications for her multiple health conditions.   Objective:   Blood pressure 117/75, pulse 73, temperature 98.2 F (36.8 C), height 5\' 6"  (1.676 m), weight 242 lb (109.8 kg), SpO2 97 %. Body mass index is 39.06 kg/m.  General: Cooperative, alert, well developed, in no acute distress. HEENT: Conjunctivae and lids unremarkable. Cardiovascular: Regular rhythm.  Lungs: Normal work of breathing. Neurologic: No focal deficits.   Lab Results  Component Value Date   CREATININE 0.98 03/19/2021   BUN 22 03/19/2021   NA 144 03/19/2021   K 4.8 03/19/2021   CL 109 (H) 03/19/2021   CO2 22 03/19/2021   Lab Results  Component Value Date   ALT 20 03/19/2021   AST 13 03/19/2021   ALKPHOS 150 (H) 03/19/2021   BILITOT 0.4 03/19/2021   Lab Results  Component Value Date   HGBA1C 5.7 (H) 03/19/2021   HGBA1C 5.9 (H) 08/29/2020   HGBA1C 5.7 (H) 01/27/2020   HGBA1C 5.8 (H) 09/08/2019   Lab Results  Component Value Date   INSULIN 16.4 03/19/2021   INSULIN 10.5 08/29/2020   INSULIN 22.2 01/27/2020   INSULIN 21.1 09/08/2019   Lab Results  Component Value Date   TSH 1.570 09/08/2019   Lab Results  Component Value Date   CHOL 165 03/19/2021   HDL 47 03/19/2021   LDLCALC 101 (H) 03/19/2021   TRIG 89 03/19/2021   Lab Results  Component Value Date   VD25OH 49.9 03/19/2021   VD25OH 36.3 08/29/2020   VD25OH 40.9 01/27/2020   Lab Results  Component Value Date   WBC 10.1 09/08/2019   HGB 15.1 09/08/2019   HCT 43.8 09/08/2019   MCV 87 09/08/2019   PLT 320 09/08/2019    Attestation Statements:   Reviewed by clinician on day of visit: allergies, medications, problem  list, medical history, surgical history, family history, social history, and previous encounter notes.  09/10/2019, CMA, am acting as transcriptionist for Edmund Hilda, MD.   I have reviewed the above documentation for accuracy and completeness, and I agree with the above. - Reuben Likes, MD

## 2021-04-04 ENCOUNTER — Other Ambulatory Visit (INDEPENDENT_AMBULATORY_CARE_PROVIDER_SITE_OTHER): Payer: Self-pay | Admitting: Family Medicine

## 2021-04-04 DIAGNOSIS — E559 Vitamin D deficiency, unspecified: Secondary | ICD-10-CM

## 2021-04-10 ENCOUNTER — Ambulatory Visit (INDEPENDENT_AMBULATORY_CARE_PROVIDER_SITE_OTHER): Payer: 59 | Admitting: Physician Assistant

## 2021-04-18 ENCOUNTER — Ambulatory Visit (INDEPENDENT_AMBULATORY_CARE_PROVIDER_SITE_OTHER): Payer: 59 | Admitting: Family Medicine

## 2021-04-18 ENCOUNTER — Encounter (INDEPENDENT_AMBULATORY_CARE_PROVIDER_SITE_OTHER): Payer: Self-pay | Admitting: Family Medicine

## 2021-04-18 ENCOUNTER — Other Ambulatory Visit: Payer: Self-pay

## 2021-04-18 VITALS — BP 118/81 | HR 92 | Temp 98.2°F | Ht 66.0 in | Wt 244.0 lb

## 2021-04-18 DIAGNOSIS — I10 Essential (primary) hypertension: Secondary | ICD-10-CM

## 2021-04-18 DIAGNOSIS — Z9189 Other specified personal risk factors, not elsewhere classified: Secondary | ICD-10-CM

## 2021-04-18 DIAGNOSIS — R632 Polyphagia: Secondary | ICD-10-CM | POA: Diagnosis not present

## 2021-04-18 DIAGNOSIS — R7303 Prediabetes: Secondary | ICD-10-CM

## 2021-04-18 DIAGNOSIS — E559 Vitamin D deficiency, unspecified: Secondary | ICD-10-CM

## 2021-04-18 DIAGNOSIS — Z6841 Body Mass Index (BMI) 40.0 and over, adult: Secondary | ICD-10-CM

## 2021-04-18 MED ORDER — PHENTERMINE HCL 8 MG PO TABS: 12.0000 mg | ORAL_TABLET | Freq: Every day | ORAL | 0 refills | Status: DC

## 2021-04-18 MED ORDER — TELMISARTAN 20 MG PO TABS: 10.0000 mg | ORAL_TABLET | Freq: Every day | ORAL | 0 refills | Status: DC

## 2021-04-18 MED ORDER — VITAMIN D (ERGOCALCIFEROL) 1.25 MG (50000 UNIT) PO CAPS: 50000.0000 [IU] | ORAL_CAPSULE | ORAL | 0 refills | Status: DC

## 2021-04-18 MED ORDER — TOPIRAMATE 50 MG PO TABS: 75.0000 mg | ORAL_TABLET | Freq: Every day | ORAL | 0 refills | Status: DC

## 2021-04-19 LAB — COMPREHENSIVE METABOLIC PANEL
ALT: 18 IU/L (ref 0–32)
AST: 16 IU/L (ref 0–40)
Albumin/Globulin Ratio: 2.3 — ABNORMAL HIGH (ref 1.2–2.2)
Albumin: 4.6 g/dL (ref 3.8–4.8)
Alkaline Phosphatase: 168 IU/L — ABNORMAL HIGH (ref 44–121)
BUN/Creatinine Ratio: 21 (ref 12–28)
BUN: 22 mg/dL (ref 8–27)
Bilirubin Total: 0.5 mg/dL (ref 0.0–1.2)
CO2: 20 mmol/L (ref 20–29)
Calcium: 10.8 mg/dL — ABNORMAL HIGH (ref 8.7–10.3)
Chloride: 104 mmol/L (ref 96–106)
Creatinine, Ser: 1.07 mg/dL — ABNORMAL HIGH (ref 0.57–1.00)
Globulin, Total: 2 g/dL (ref 1.5–4.5)
Glucose: 102 mg/dL — ABNORMAL HIGH (ref 65–99)
Potassium: 4.6 mmol/L (ref 3.5–5.2)
Sodium: 139 mmol/L (ref 134–144)
Total Protein: 6.6 g/dL (ref 6.0–8.5)
eGFR: 58 mL/min/{1.73_m2} — ABNORMAL LOW (ref >59)

## 2021-04-19 NOTE — Progress Notes (Signed)
Chief Complaint:   OBESITY Roisin is here to discuss her progress with her obesity treatment plan along with follow-up of her obesity related diagnoses. Keniesha is on the Category 3 Plan and states she is following her eating plan approximately 60% of the time. Jolissa states she is working out in the pool 3-4 hours 6 times per week.  Today's visit was #: 31 Starting weight: 290 lbs Starting date: 09/08/2019 Today's weight: 244 lbs Today's date: 04/18/2021 Total lbs lost to date: 46 Total lbs lost since last in-office visit: 0  Interim History: Adrien went to the beach for a week and had an amazing time. She and her husband were able to relax. She may have BBQ at her sister's house for Labor Day. Pt has noticed an increase in cravings for sugar.  Subjective:   1. Polyphagia Daurice is on phentermine/topiramate combo with some control of cravings.  2. Essential hypertension BP controlled today. Pt denies chest pain/chest pressure/headache.  3. Serum calcium elevated Angela's calcium is elevated at 10.5 from 10.0. She is not on Calcium or Calcium sparing medications.   4. Pre-diabetes Pt's last A1c was 5.7 and insulin level 16.4. She is not on meds.  5. Vitamin D deficiency Kaelah is on prescription Vit D. Her last Vit D level was 49.  6. At risk for side effect of medication Jannessa is at risk for side effects of medication due to increasing dose of phentermine and topiramate.  Assessment/Plan:   1. Polyphagia Increase topiramate to 50 mg and increase phentermine to 8 mg. Intensive lifestyle modifications are the first line treatment for this issue. We discussed several lifestyle modifications today and she will continue to work on diet, exercise and weight loss efforts. Orders and follow up as documented in patient record.  Counseling Polyphagia is excessive hunger. Causes can include: low blood sugars, hypERthyroidism, PMS, lack of sleep, stress, insulin  resistance, diabetes, certain medications, and diets that are deficient in protein and fiber.   Increase and Refill- topiramate (TOPAMAX) 50 MG tablet; Take 1.5 tablets (75 mg total) by mouth daily.  Dispense: 45 tablet; Refill: 0 Increase and Refill- Phentermine HCl 8 MG TABS; Take 12 mg by mouth daily.  Dispense: 45 tablet; Refill: 0  2. Essential hypertension Shayann is working on healthy weight loss and exercise to improve blood pressure control. We will watch for signs of hypotension as she continues her lifestyle modifications.  Refill- telmisartan (MICARDIS) 20 MG tablet; Take 0.5 tablets (10 mg total) by mouth daily.  Dispense: 45 tablet; Refill: 0  3. Serum calcium elevated Check CMP today.  - Comprehensive metabolic panel  4. Pre-diabetes Teagan will continue to work on weight loss, exercise, and decreasing simple carbohydrates to help decrease the risk of diabetes. Repeat labs in 3 months.  5. Vitamin D deficiency Low Vitamin D level contributes to fatigue and are associated with obesity, breast, and colon cancer. She agrees to continue to take prescription Vitamin D 50,000 IU every week and will follow-up for routine testing of Vitamin D, at least 2-3 times per year to avoid over-replacement.  Refill- Vitamin D, Ergocalciferol, (DRISDOL) 1.25 MG (50000 UNIT) CAPS capsule; Take 1 capsule (50,000 Units total) by mouth every 7 (seven) days.  Dispense: 4 capsule; Refill: 0  6. At risk for side effect of medication Talar was given approximately 15 minutes of drug side effect counseling today.  We discussed side effect possibility and risk versus benefits. Lorenzo agreed to the medication and  will contact this office if these side effects are intolerable.  Repetitive spaced learning was employed today to elicit superior memory formation and behavioral change.  7. Obesity with current BMI of 39.5  Jack is currently in the action stage of change. As such, her goal is to  continue with weight loss efforts. She has agreed to the Category 3 Plan.   Exercise goals: All adults should avoid inactivity. Some physical activity is better than none, and adults who participate in any amount of physical activity gain some health benefits.  Behavioral modification strategies: increasing lean protein intake, meal planning and cooking strategies, keeping healthy foods in the home, and planning for success.  Nyliah has agreed to follow-up with our clinic in 3 weeks. She was informed of the importance of frequent follow-up visits to maximize her success with intensive lifestyle modifications for her multiple health conditions.   Objective:   Blood pressure 118/81, pulse 92, temperature 98.2 F (36.8 C), height 5\' 6"  (1.676 m), weight 244 lb (110.7 kg), SpO2 99 %. Body mass index is 39.38 kg/m.  General: Cooperative, alert, well developed, in no acute distress. HEENT: Conjunctivae and lids unremarkable. Cardiovascular: Regular rhythm.  Lungs: Normal work of breathing. Neurologic: No focal deficits.   Lab Results  Component Value Date   CREATININE 1.07 (H) 04/18/2021   BUN 22 04/18/2021   NA 139 04/18/2021   K 4.6 04/18/2021   CL 104 04/18/2021   CO2 20 04/18/2021   Lab Results  Component Value Date   ALT 18 04/18/2021   AST 16 04/18/2021   ALKPHOS 168 (H) 04/18/2021   BILITOT 0.5 04/18/2021   Lab Results  Component Value Date   HGBA1C 5.7 (H) 03/19/2021   HGBA1C 5.9 (H) 08/29/2020   HGBA1C 5.7 (H) 01/27/2020   HGBA1C 5.8 (H) 09/08/2019   Lab Results  Component Value Date   INSULIN 16.4 03/19/2021   INSULIN 10.5 08/29/2020   INSULIN 22.2 01/27/2020   INSULIN 21.1 09/08/2019   Lab Results  Component Value Date   TSH 1.570 09/08/2019   Lab Results  Component Value Date   CHOL 165 03/19/2021   HDL 47 03/19/2021   LDLCALC 101 (H) 03/19/2021   TRIG 89 03/19/2021   Lab Results  Component Value Date   VD25OH 49.9 03/19/2021   VD25OH 36.3  08/29/2020   VD25OH 40.9 01/27/2020   Lab Results  Component Value Date   WBC 10.1 09/08/2019   HGB 15.1 09/08/2019   HCT 43.8 09/08/2019   MCV 87 09/08/2019   PLT 320 09/08/2019    Attestation Statements:   Reviewed by clinician on day of visit: allergies, medications, problem list, medical history, surgical history, family history, social history, and previous encounter notes.  09/10/2019, CMA, am acting as transcriptionist for Edmund Hilda, MD.   I have reviewed the above documentation for accuracy and completeness, and I agree with the above. - Reuben Likes, MD

## 2021-04-20 ENCOUNTER — Other Ambulatory Visit (INDEPENDENT_AMBULATORY_CARE_PROVIDER_SITE_OTHER): Payer: Self-pay | Admitting: Family Medicine

## 2021-04-20 DIAGNOSIS — E559 Vitamin D deficiency, unspecified: Secondary | ICD-10-CM

## 2021-04-24 NOTE — Telephone Encounter (Signed)
Dr.Ukleja 

## 2021-04-25 ENCOUNTER — Telehealth (INDEPENDENT_AMBULATORY_CARE_PROVIDER_SITE_OTHER): Payer: Self-pay | Admitting: Family Medicine

## 2021-04-25 NOTE — Telephone Encounter (Signed)
Called patient to discuss lab reports- uptrending Calcium and normal albumin.  Slightly elevated creatinine and consistently elevated alkaline phos.  Will redraw ionized calcium as well as PTH.  Patient informed to go to any [facility] location prior to next appt.

## 2021-04-27 ENCOUNTER — Encounter (INDEPENDENT_AMBULATORY_CARE_PROVIDER_SITE_OTHER): Payer: Self-pay | Admitting: Family Medicine

## 2021-04-27 LAB — CA+CREAT+P+PTH INTACT
Calcium: 10.8 mg/dL — ABNORMAL HIGH (ref 8.7–10.3)
Creatinine, Ser: 1 mg/dL (ref 0.57–1.00)
PTH: 30 pg/mL (ref 15–65)
Phosphorus: 2.9 mg/dL — ABNORMAL LOW (ref 3.0–4.3)
eGFR: 63 mL/min/{1.73_m2} (ref >59)

## 2021-04-27 LAB — CALCIUM, IONIZED: Calcium, Ion: 5.8 mg/dL — ABNORMAL HIGH (ref 4.5–5.6)

## 2021-04-27 NOTE — Telephone Encounter (Signed)
Please advise 

## 2021-04-30 ENCOUNTER — Other Ambulatory Visit (INDEPENDENT_AMBULATORY_CARE_PROVIDER_SITE_OTHER): Payer: Self-pay | Admitting: Family Medicine

## 2021-04-30 DIAGNOSIS — R632 Polyphagia: Secondary | ICD-10-CM

## 2021-04-30 NOTE — Telephone Encounter (Signed)
Please advise 

## 2021-05-09 ENCOUNTER — Encounter (INDEPENDENT_AMBULATORY_CARE_PROVIDER_SITE_OTHER): Payer: Self-pay | Admitting: Family Medicine

## 2021-05-09 ENCOUNTER — Other Ambulatory Visit: Payer: Self-pay

## 2021-05-09 ENCOUNTER — Ambulatory Visit (INDEPENDENT_AMBULATORY_CARE_PROVIDER_SITE_OTHER): Payer: 59 | Admitting: Family Medicine

## 2021-05-09 VITALS — BP 134/85 | HR 87 | Temp 98.3°F | Ht 66.0 in | Wt 246.0 lb

## 2021-05-09 DIAGNOSIS — Z6841 Body Mass Index (BMI) 40.0 and over, adult: Secondary | ICD-10-CM | POA: Diagnosis not present

## 2021-05-09 DIAGNOSIS — E559 Vitamin D deficiency, unspecified: Secondary | ICD-10-CM

## 2021-05-10 NOTE — Progress Notes (Signed)
Chief Complaint:   OBESITY Tiffany Norton is here to discuss her progress with her obesity treatment plan along with follow-up of her obesity related diagnoses. Tiffany Norton is on the Category 3 Plan and states she is following her eating plan approximately 80% of the time. Tiffany Norton states she is walking the dog 60 minutes 7 times per week.  Today's visit was #: 32 Starting weight: 290 lbs Starting date: 09/08/2019 Today's weight: 246 lbs Today's date: 05/09/2021 Total lbs lost to date: 44 Total lbs lost since last in-office visit: 0  Interim History: Tiffany Norton had a stressful last few weeks with her husband being diagnosed with DVT and starting with lifelong anticoagulants. She was also concerned with lab results. Pt stopped topiramate/phentermine combo. She has been doing some stress eating with increased carb intake over the last few weeks. Pt hasn't made an appointment with ortho yet.   Subjective:   1. Vitamin D deficiency Tiffany Norton denies nausea, vomiting, and muscle weakness but notes fatigue. Her last Vit D level was 49.9.  2. Hypercalcemia Calcium level of 10.8, PTH within normal limits, and phosphorus borderline low. Pt is not on calcium sparing medication.  Assessment/Plan:   1. Vitamin D deficiency Low Vitamin D level contributes to fatigue and are associated with obesity, breast, and colon cancer. She will discontinue prescription Vitamin D and switch to OTC Vit D daily and will follow-up for routine testing of Vitamin D, at least 2-3 times per year to avoid over-replacement. Recheck labs in November.  2. Hypercalcemia Repeat labs in November.  3. Obesity with current BMI of 39.7  Tiffany Norton is currently in the action stage of change. As such, her goal is to continue with weight loss efforts. She has agreed to the Category 3 Plan.   Exercise goals: All adults should avoid inactivity. Some physical activity is better than none, and adults who participate in any amount of physical  activity gain some health benefits.  Behavioral modification strategies: increasing lean protein intake, meal planning and cooking strategies, and keeping healthy foods in the home.  Tiffany Norton has agreed to follow-up with our clinic in 4 weeks. She was informed of the importance of frequent follow-up visits to maximize her success with intensive lifestyle modifications for her multiple health conditions.   Objective:   Blood pressure 134/85, pulse 87, temperature 98.3 F (36.8 C), height 5\' 6"  (1.676 m), weight 246 lb (111.6 kg), SpO2 99 %. Body mass index is 39.71 kg/m.  General: Cooperative, alert, well developed, in no acute distress. HEENT: Conjunctivae and lids unremarkable. Cardiovascular: Regular rhythm.  Lungs: Normal work of breathing. Neurologic: No focal deficits.   Lab Results  Component Value Date   CREATININE 1.00 04/26/2021   BUN 22 04/18/2021   NA 139 04/18/2021   K 4.6 04/18/2021   CL 104 04/18/2021   CO2 20 04/18/2021   Lab Results  Component Value Date   ALT 18 04/18/2021   AST 16 04/18/2021   ALKPHOS 168 (H) 04/18/2021   BILITOT 0.5 04/18/2021   Lab Results  Component Value Date   HGBA1C 5.7 (H) 03/19/2021   HGBA1C 5.9 (H) 08/29/2020   HGBA1C 5.7 (H) 01/27/2020   HGBA1C 5.8 (H) 09/08/2019   Lab Results  Component Value Date   INSULIN 16.4 03/19/2021   INSULIN 10.5 08/29/2020   INSULIN 22.2 01/27/2020   INSULIN 21.1 09/08/2019   Lab Results  Component Value Date   TSH 1.570 09/08/2019   Lab Results  Component Value Date  CHOL 165 03/19/2021   HDL 47 03/19/2021   LDLCALC 101 (H) 03/19/2021   TRIG 89 03/19/2021   Lab Results  Component Value Date   VD25OH 49.9 03/19/2021   VD25OH 36.3 08/29/2020   VD25OH 40.9 01/27/2020   Lab Results  Component Value Date   WBC 10.1 09/08/2019   HGB 15.1 09/08/2019   HCT 43.8 09/08/2019   MCV 87 09/08/2019   PLT 320 09/08/2019    Attestation Statements:   Reviewed by clinician on day of  visit: allergies, medications, problem list, medical history, surgical history, family history, social history, and previous encounter notes.  Time spent on visit including pre-visit chart review and post-visit care and charting was 17 minutes.   Edmund Hilda, CMA, am acting as transcriptionist for Reuben Likes, MD.   I have reviewed the above documentation for accuracy and completeness, and I agree with the above. - Reuben Likes, MD

## 2021-05-11 ENCOUNTER — Other Ambulatory Visit (INDEPENDENT_AMBULATORY_CARE_PROVIDER_SITE_OTHER): Payer: Self-pay | Admitting: Family Medicine

## 2021-05-11 DIAGNOSIS — R632 Polyphagia: Secondary | ICD-10-CM

## 2021-05-11 NOTE — Telephone Encounter (Signed)
Last seen by Dr. U. 

## 2021-05-22 ENCOUNTER — Other Ambulatory Visit (INDEPENDENT_AMBULATORY_CARE_PROVIDER_SITE_OTHER): Payer: Self-pay | Admitting: Family Medicine

## 2021-05-22 DIAGNOSIS — R632 Polyphagia: Secondary | ICD-10-CM

## 2021-06-06 ENCOUNTER — Ambulatory Visit (INDEPENDENT_AMBULATORY_CARE_PROVIDER_SITE_OTHER): Payer: 59 | Admitting: Family Medicine

## 2021-06-20 ENCOUNTER — Other Ambulatory Visit: Payer: Self-pay

## 2021-06-20 ENCOUNTER — Encounter (INDEPENDENT_AMBULATORY_CARE_PROVIDER_SITE_OTHER): Payer: Self-pay | Admitting: Family Medicine

## 2021-06-20 ENCOUNTER — Ambulatory Visit (INDEPENDENT_AMBULATORY_CARE_PROVIDER_SITE_OTHER): Payer: 59 | Admitting: Family Medicine

## 2021-06-20 VITALS — BP 100/72 | HR 89 | Temp 97.9°F | Ht 66.0 in | Wt 249.0 lb

## 2021-06-20 DIAGNOSIS — Z9189 Other specified personal risk factors, not elsewhere classified: Secondary | ICD-10-CM | POA: Diagnosis not present

## 2021-06-20 DIAGNOSIS — I10 Essential (primary) hypertension: Secondary | ICD-10-CM | POA: Diagnosis not present

## 2021-06-20 DIAGNOSIS — Z6841 Body Mass Index (BMI) 40.0 and over, adult: Secondary | ICD-10-CM

## 2021-06-20 DIAGNOSIS — F3289 Other specified depressive episodes: Secondary | ICD-10-CM

## 2021-06-20 MED ORDER — VENLAFAXINE HCL 75 MG PO TABS: 75.0000 mg | ORAL_TABLET | Freq: Every day | ORAL | 3 refills | Status: DC

## 2021-06-20 MED ORDER — TELMISARTAN 20 MG PO TABS: 10.0000 mg | ORAL_TABLET | Freq: Every day | ORAL | 0 refills | Status: DC

## 2021-06-20 NOTE — Progress Notes (Signed)
Chief Complaint:   OBESITY Tiffany Norton is here to discuss her progress with her obesity treatment plan along with follow-up of her obesity related diagnoses. Tiffany Norton is on the Category 3 Plan and states she is following her eating plan approximately 50% of the time. Tiffany Norton states she is not currently exercising.  Today's visit was #: 33 Starting weight: 290 lbs Starting date: 09/08/2019 Today's weight: 249 lbs Today's date: 06/20/2021 Total lbs lost to date: 41 Total lbs lost since last in-office visit: 0  Interim History: Tiffany Norton has been in a bit of a slump. She realizes she wants to get back on track. She has no upcoming plans prior to Thanksgiving. Pt can continue with category 3. She has no issues with the plan, just issues with motivation.  Subjective:   1. Essential hypertension BP well controlled today. Pt denies chest pain/chest pressure/headache.  2. Other depression with emotional eating Tiffany Norton is doing well with Effexor. Pt denies suicidal or homicidal ideations.  3. At risk for heart disease Tiffany Norton is at a higher than average risk for cardiovascular disease due to obesity.   Assessment/Plan:   1. Essential hypertension Tiffany Norton is working on healthy weight loss and exercise to improve blood pressure control. We will watch for signs of hypotension as she continues her lifestyle modifications.  Refill- telmisartan (MICARDIS) 20 MG tablet; Take 0.5 tablets (10 mg total) by mouth daily.  Dispense: 45 tablet; Refill: 0  2. Other depression with emotional eating Behavior modification techniques were discussed today to help Tiffany Norton deal with her emotional/non-hunger eating behaviors.  Orders and follow up as documented in patient record.   Refill- venlafaxine (EFFEXOR) 75 MG tablet; Take 1 tablet (75 mg total) by mouth daily.  Dispense: 90 tablet; Refill: 3  3. At risk for heart disease Tiffany Norton was given approximately 15 minutes of coronary artery disease prevention  counseling today. She is 65 y.o. female and has risk factors for heart disease including obesity. We discussed intensive lifestyle modifications today with an emphasis on specific weight loss instructions and strategies.   Repetitive spaced learning was employed today to elicit superior memory formation and behavioral change.  4. Class 3 severe obesity with serious comorbidity and body mass index (BMI) of 45.0 to 49.9 in adult, unspecified obesity type (HCC)  Tiffany Norton is currently in the action stage of change. As such, her goal is to continue with weight loss efforts. She has agreed to the Category 3 Plan.   Exercise goals: No exercise has been prescribed at this time.  Behavioral modification strategies: increasing lean protein intake, meal planning and cooking strategies, and keeping healthy foods in the home.  Tiffany Norton has agreed to follow-up with our clinic in 2-3 weeks. She was informed of the importance of frequent follow-up visits to maximize her success with intensive lifestyle modifications for her multiple health conditions.   Objective:   Blood pressure 100/72, pulse 89, temperature 97.9 F (36.6 C), height 5\' 6"  (1.676 m), weight 249 lb (112.9 kg), SpO2 97 %. Body mass index is 40.19 kg/m.  General: Cooperative, alert, well developed, in no acute distress. HEENT: Conjunctivae and lids unremarkable. Cardiovascular: Regular rhythm.  Lungs: Normal work of breathing. Neurologic: No focal deficits.   Lab Results  Component Value Date   CREATININE 1.00 04/26/2021   BUN 22 04/18/2021   NA 139 04/18/2021   K 4.6 04/18/2021   CL 104 04/18/2021   CO2 20 04/18/2021   Lab Results  Component Value Date  ALT 18 04/18/2021   AST 16 04/18/2021   ALKPHOS 168 (H) 04/18/2021   BILITOT 0.5 04/18/2021   Lab Results  Component Value Date   HGBA1C 5.7 (H) 03/19/2021   HGBA1C 5.9 (H) 08/29/2020   HGBA1C 5.7 (H) 01/27/2020   HGBA1C 5.8 (H) 09/08/2019   Lab Results  Component  Value Date   INSULIN 16.4 03/19/2021   INSULIN 10.5 08/29/2020   INSULIN 22.2 01/27/2020   INSULIN 21.1 09/08/2019   Lab Results  Component Value Date   TSH 1.570 09/08/2019   Lab Results  Component Value Date   CHOL 165 03/19/2021   HDL 47 03/19/2021   LDLCALC 101 (H) 03/19/2021   TRIG 89 03/19/2021   Lab Results  Component Value Date   VD25OH 49.9 03/19/2021   VD25OH 36.3 08/29/2020   VD25OH 40.9 01/27/2020   Lab Results  Component Value Date   WBC 10.1 09/08/2019   HGB 15.1 09/08/2019   HCT 43.8 09/08/2019   MCV 87 09/08/2019   PLT 320 09/08/2019    Attestation Statements:   Reviewed by clinician on day of visit: allergies, medications, problem list, medical history, surgical history, family history, social history, and previous encounter notes.  Edmund Hilda, CMA, am acting as transcriptionist for Reuben Likes, MD.   I have reviewed the above documentation for accuracy and completeness, and I agree with the above. - Reuben Likes, MD

## 2021-06-29 ENCOUNTER — Other Ambulatory Visit: Payer: Self-pay

## 2021-06-29 ENCOUNTER — Ambulatory Visit: Payer: 59 | Admitting: Physical Therapy

## 2021-06-29 ENCOUNTER — Encounter: Payer: Self-pay | Admitting: Physical Therapy

## 2021-06-29 DIAGNOSIS — M722 Plantar fascial fibromatosis: Secondary | ICD-10-CM | POA: Diagnosis not present

## 2021-06-29 DIAGNOSIS — M6281 Muscle weakness (generalized): Secondary | ICD-10-CM | POA: Diagnosis not present

## 2021-06-29 DIAGNOSIS — R262 Difficulty in walking, not elsewhere classified: Secondary | ICD-10-CM

## 2021-06-29 NOTE — Therapy (Signed)
[facility] [place] [address] [place], [place], [address] Phone: [phone]   Fax:  [phone]  Physical Therapy Evaluation  Patient Details  Name: Tiffany Norton MRN: [id number] Date of Birth: 11-29-1955 Referring Provider (PT): Alfredo Martinez   Encounter Date: 06/29/2021   PT End of Session - 06/29/21 0926     Visit Number 1    Number of Visits 12    Date for PT Re-Evaluation 08/10/21    PT Start Time 0845    PT Stop Time 0920    PT Time Calculation (min) 35 min    Activity Tolerance Patient tolerated treatment well    Behavior During Therapy [facility] for tasks assessed/performed             Past Medical History:  Diagnosis Date   Acid reflux    Arthritis    Back pain    Bilateral swelling of feet    CIN I (cervical intraepithelial neoplasia I)    Constipation    Depression    Elevated cholesterol    Hyperlipidemia    Hypertension    IBS (irritable bowel syndrome)    Joint pain    Knee pain    Obesity    Plantar fasciitis    PMDD (premenstrual dysphoric disorder)    Prediabetes     Past Surgical History:  Procedure Laterality Date   ANAL FISSURE REPAIR     ANKLE SURGERY     Arm Surg     CERVICAL BIOPSY  W/ LOOP ELECTRODE EXCISION  2006   CIN l   CESAREAN SECTION     COLPOSCOPY     TUBAL LIGATION      There were no vitals filed for this visit.    Subjective Assessment - 06/29/21 0844     Subjective Pt states she has been dealing with plantar fasciitis in bilat feet for "years". She states her Rt foot is hurting worse lately. She had Rt ankle surgery years ago and feels the Rt foot has always hurt more. She has had injections but they have not helped in the long term.    Pertinent History Rt ankle surgery to repair torn tendon. Pending knee replacements    Limitations Walking;Standing    How long can you stand comfortably? 10 minutes    How long can you walk comfortably? 10 minutes     Patient Stated Goals decrease pain and improve ability to exercise    Currently in Pain? Yes    Pain Score 5     Pain Location Foot    Pain Orientation Right;Left    Pain Descriptors / Indicators Sore    Pain Type Chronic pain    Pain Onset More than a month ago    Pain Frequency Constant    Aggravating Factors  standing, walking    Pain Relieving Factors rest                [facility] PT Assessment - 06/29/21 0001       Assessment   Medical Diagnosis plantar fasciitis Rt foot    Referring Provider (PT) Alfredo Martinez    Onset Date/Surgical Date 04/23/21    Hand Dominance Right    Prior Therapy none      Precautions   Precautions None      Restrictions   Weight Bearing Restrictions No      Balance Screen   Has the patient fallen in the past 6 months No  Prior Function   [place] Independent      Observation/Other Assessments   Focus on Therapeutic Outcomes (FOTO)  not assessed      Functional Tests   Functional tests Single leg stance      Single Leg Stance   Comments Lt 3 seconds, Rt 1 second      ROM / Strength   AROM / PROM / Strength AROM;Strength      AROM   Overall AROM Comments great toe ROM WFL bilat    AROM Assessment Site Ankle    Right/Left Ankle Right;Left    Right Ankle Dorsiflexion 5    Right Ankle Plantar Flexion 31    Right Ankle Inversion 20    Right Ankle Eversion 26    Left Ankle Dorsiflexion 10    Left Ankle Plantar Flexion 47    Left Ankle Inversion 18    Left Ankle Eversion 17      Strength   Overall Strength Comments increased pain on Rt foot with resisted PF, and resisted great toe flexion    Strength Assessment Site Ankle    Right/Left Ankle Right;Left    Right Ankle Dorsiflexion 4/5    Right Ankle Plantar Flexion 4/5    Right Ankle Inversion 4-/5    Right Ankle Eversion 4-/5    Left Ankle Dorsiflexion 4/5    Left Ankle Plantar Flexion 4/5    Left Ankle Inversion 4-/5    Left Ankle Eversion 4-/5       Palpation   Palpation comment TTP Rt Plantar fascia > Lt. hypomobile talocrural jt mobs Rt      Ambulation/Gait   Assistive device None    Gait Pattern Antalgic    Gait velocity decreased    Gait Comments antalgic gait with decreased Rt stance time                        Objective measurements completed on examination: See above findings.       [facility] Adult PT Treatment/Exercise - 06/29/21 0001       Exercises   Exercises Ankle      Ankle Exercises: Stretches   Plantar Fascia Stretch 2 reps;20 seconds    Plantar Fascia Stretch Limitations bilat    Soleus Stretch 2 reps;20 seconds    Soleus Stretch Limitations bilat    Gastroc Stretch 2 reps;20 seconds    Gastroc Stretch Limitations bilat      Ankle Exercises: Seated   Towel Crunch 1 rep    Towel Crunch Limitations 1 minute                     PT Education - 06/29/21 0914     Education Details PT POC and goals, HEP    Person(s) Educated Patient    Methods Explanation;Demonstration;Handout    Comprehension Verbalized understanding;Returned demonstration                 PT Long Term Goals - 06/29/21 0932       PT LONG TERM GOAL #1   Title Pt will be independent with HEP    Time 6    Period Weeks    Status New    Target Date 08/10/21      PT LONG TERM GOAL #2   Title Pt will improve bilat ankle strength to 4+/5 to improve standing and walking tolerance    Time 6    Period Weeks  Status New    Target Date 08/10/21      PT LONG TERM GOAL #3   Title Pt will tolerate standing x 20 minutes with pain <= 2/10    Time 6    Period Weeks    Status New    Target Date 08/10/21      PT LONG TERM GOAL #4   Title Pt will perform gait x 20 minutes with pain <= 2/10    Time 6    Period Weeks    Status New    Target Date 08/10/21                    Plan - 06/29/21 0938     Clinical Impression Statement Pt is a 65 y/o female referred for plantar fasciitis. Pt presents  with decreased strength and ROM, increased pain and impaired gait and balance. Pt will benefit from skilled PT to address deficits and improve functional mobility.    Personal Factors and Comorbidities Comorbidity 2;Time since onset of injury/illness/exacerbation    Examination-Activity Limitations Stairs;Stand;Locomotion Norton    Examination-Participation Restrictions Community Activity;Shop    Stability/Clinical Decision Making Stable/Uncomplicated    Clinical Decision Making Low    Rehab Potential Good    PT Frequency 2x / week    PT Duration 6 weeks    PT Treatment/Interventions Aquatic Therapy;Cryotherapy;Moist Heat;Ultrasound;Iontophoresis 4mg /ml Dexamethasone;Electrical Stimulation;Gait training;Stair training;Balance training;Neuromuscular re-education;Therapeutic exercise;Therapeutic activities;Patient/family education;Manual techniques;Passive range of motion;Dry needling;Taping;Vasopneumatic Device    PT Next Visit Plan assess HEP, progress to standing exercises if tolerated    PT Home Exercise Plan [email or web address]    Consulted and Agree with Plan of Care Patient             Patient will benefit from skilled therapeutic intervention in order to improve the following deficits and impairments:  Pain, Increased fascial restricitons, Decreased strength, Decreased activity tolerance, Decreased range of motion, Difficulty walking, Decreased balance  Visit Diagnosis: Muscle weakness (generalized) - Plan: PT plan of care cert/re-cert  Plantar fasciitis, bilateral - Plan: PT plan of care cert/re-cert  Difficulty in walking, not elsewhere classified - Plan: PT plan of care cert/re-cert     Problem List Patient Active Problem List   Diagnosis Date Noted   Vitamin D deficiency 02/16/2020   Prediabetes 02/16/2020   B12 deficiency 02/16/2020   Class 3 severe obesity with serious comorbidity and body mass index (BMI) of 40.0 to 44.9 in adult (HCC) 02/16/2020   Essential hypertension  04/18/2016   Other hyperlipidemia    CIN I (cervical intraepithelial neoplasia I)    PMDD (premenstrual dysphoric disorder)    IBS (irritable bowel syndrome)     Ausha Sieh, PT 06/29/2021, 9:41 AM  [facility] [address] [place], [place], [place] Phone: [phone]   Fax:  [phone]  Name: Tiffany Norton MRN: Alvester Chou Date of Birth: Jan 04, 1956

## 2021-06-29 NOTE — Patient Instructions (Signed)
Access Code: [email or web address] URL: [email or web address] Date: 06/29/2021 Prepared by: Reggy Eye  Exercises Gastroc Stretch on Wall - 1 x daily - 7 x weekly - 3 sets - 1 reps - 20-30 seconds hold Soleus Stretch on Wall - 1 x daily - 7 x weekly - 3 sets - 1 reps - 20-30 seconds hold Standing Toe Dorsiflexion Stretch - 1 x daily - 7 x weekly - 3 sets - 1 reps - 20-30 seconds hold Towel Scrunches - 1 x daily - 7 x weekly - 3 sets - 1 reps - 1 minute hold

## 2021-07-04 ENCOUNTER — Encounter: Payer: 59 | Admitting: Physical Therapy

## 2021-07-06 ENCOUNTER — Encounter: Payer: 59 | Admitting: Physical Therapy

## 2021-07-07 ENCOUNTER — Encounter (INDEPENDENT_AMBULATORY_CARE_PROVIDER_SITE_OTHER): Payer: Self-pay | Admitting: Family Medicine

## 2021-07-09 ENCOUNTER — Telehealth (INDEPENDENT_AMBULATORY_CARE_PROVIDER_SITE_OTHER): Payer: 59 | Admitting: Family Medicine

## 2021-07-09 ENCOUNTER — Encounter (INDEPENDENT_AMBULATORY_CARE_PROVIDER_SITE_OTHER): Payer: Self-pay | Admitting: Family Medicine

## 2021-07-09 DIAGNOSIS — Z6841 Body Mass Index (BMI) 40.0 and over, adult: Secondary | ICD-10-CM | POA: Diagnosis not present

## 2021-07-09 DIAGNOSIS — I10 Essential (primary) hypertension: Secondary | ICD-10-CM | POA: Diagnosis not present

## 2021-07-09 DIAGNOSIS — E66813 Obesity, class 3: Secondary | ICD-10-CM

## 2021-07-09 MED ORDER — TELMISARTAN 20 MG PO TABS: 10.0000 mg | ORAL_TABLET | Freq: Every day | ORAL | 0 refills | Status: DC

## 2021-07-09 NOTE — Progress Notes (Signed)
TeleHealth Visit:  Due to the COVID-19 pandemic, this visit was completed with telemedicine (audio/video) technology to reduce patient and provider exposure as well as to preserve personal protective equipment.   Tiffany Norton has verbally consented to this TeleHealth visit. The patient is located at home, the provider is located at the [facility] office. The participants in this visit include the listed provider and patient. The visit was conducted today via video.   Chief Complaint: OBESITY Tiffany Norton is here to discuss her progress with her obesity treatment plan along with follow-up of her obesity related diagnoses. Tiffany Norton is on the Category 4 Plan and states she is following her eating plan approximately 85% of the time. Tiffany Norton states she is not currently exercising.  Today's visit was #: 34 Starting weight: 290 lbs Starting date: 09/08/2019  Interim History: Tiffany Norton was not feeling well Saturday but is feeling better today. She has dinner with her family for Thanksgiving. Pt has been on plan 85% of the time. She wants to remember to stay on track.she reports a weight of 247 lbs today (her scale is about 2 lbs heavier than clinic scale). Pt doesn't have a bunch of eating plans for next few weeks.  Subjective:   1. Essential hypertension BP previously well controlled. Pt's last BP was even slightly low. Pt denies chest pain/chest pressure/headache. She is on telmisartan.  2. Serum calcium elevated Tiffany Norton's last calcium was 10.8 and PTH 30. She denies fatigue.  Assessment/Plan:   1. Essential hypertension Tiffany Norton is working on healthy weight loss and exercise to improve blood pressure control. We will watch for signs of hypotension as she continues her lifestyle modifications. Pt may be able to wean off medication at next appt is BP is still controlled.  Refill- telmisartan (MICARDIS) 20 MG tablet; Take 0.5 tablets (10 mg total) by mouth daily.  Dispense: 45 tablet;  Refill: 0  2. Serum calcium elevated We will repeat calcium and PTH at the next appt. Pt may need referral to endocrinology.  3. Obesity with current BMI 40.19  Tiffany Norton is currently in the action stage of change. As such, her goal is to continue with weight loss efforts. She has agreed to the Category 4 Plan.   Exercise goals: No exercise has been prescribed at this time.  Behavioral modification strategies: increasing lean protein intake, meal planning and cooking strategies, keeping healthy foods in the home, dealing with family or coworker sabotage, and holiday eating strategies .  Tiffany Norton has agreed to follow-up with our clinic in 3-4 weeks. She was informed of the importance of frequent follow-up visits to maximize her success with intensive lifestyle modifications for her multiple health conditions.  Objective:   VITALS: Per patient if applicable, see vitals. GENERAL: Alert and in no acute distress. CARDIOPULMONARY: No increased WOB. Speaking in clear sentences.  PSYCH: Pleasant and cooperative. Speech normal rate and rhythm. Affect is appropriate. Insight and judgement are appropriate. Attention is focused, linear, and appropriate.  NEURO: Oriented as arrived to appointment on time with no prompting.   Lab Results  Component Value Date   CREATININE 1.00 04/26/2021   BUN 22 04/18/2021   NA 139 04/18/2021   K 4.6 04/18/2021   CL 104 04/18/2021   CO2 20 04/18/2021   Lab Results  Component Value Date   ALT 18 04/18/2021   AST 16 04/18/2021   ALKPHOS 168 (H) 04/18/2021   BILITOT 0.5 04/18/2021   Lab Results  Component Value Date   HGBA1C  5.7 (H) 03/19/2021   HGBA1C 5.9 (H) 08/29/2020   HGBA1C 5.7 (H) 01/27/2020   HGBA1C 5.8 (H) 09/08/2019   Lab Results  Component Value Date   INSULIN 16.4 03/19/2021   INSULIN 10.5 08/29/2020   INSULIN 22.2 01/27/2020   INSULIN 21.1 09/08/2019   Lab Results  Component Value Date   TSH 1.570 09/08/2019   Lab Results   Component Value Date   CHOL 165 03/19/2021   HDL 47 03/19/2021   LDLCALC 101 (H) 03/19/2021   TRIG 89 03/19/2021   Lab Results  Component Value Date   VD25OH 49.9 03/19/2021   VD25OH 36.3 08/29/2020   VD25OH 40.9 01/27/2020   Lab Results  Component Value Date   WBC 10.1 09/08/2019   HGB 15.1 09/08/2019   HCT 43.8 09/08/2019   MCV 87 09/08/2019   PLT 320 09/08/2019   No results found for: IRON, TIBC, FERRITIN  Attestation Statements:   Reviewed by clinician on day of visit: allergies, medications, problem list, medical history, surgical history, family history, social history, and previous encounter notes.   Edmund Hilda, CMA, am acting as transcriptionist for Reuben Likes, MD.   I have reviewed the above documentation for accuracy and completeness, and I agree with the above. - Reuben Likes, MD

## 2021-07-10 ENCOUNTER — Encounter: Payer: 59 | Admitting: Physical Therapy

## 2021-07-17 ENCOUNTER — Encounter: Payer: 59 | Admitting: Physical Therapy

## 2021-07-20 ENCOUNTER — Encounter: Payer: 59 | Admitting: Physical Therapy

## 2021-07-31 ENCOUNTER — Other Ambulatory Visit: Payer: Self-pay

## 2021-07-31 ENCOUNTER — Encounter (INDEPENDENT_AMBULATORY_CARE_PROVIDER_SITE_OTHER): Payer: Self-pay | Admitting: Family Medicine

## 2021-07-31 ENCOUNTER — Ambulatory Visit (INDEPENDENT_AMBULATORY_CARE_PROVIDER_SITE_OTHER): Payer: 59 | Admitting: Family Medicine

## 2021-07-31 VITALS — BP 121/80 | HR 88 | Temp 98.1°F | Ht 66.0 in | Wt 249.0 lb

## 2021-07-31 DIAGNOSIS — Z6841 Body Mass Index (BMI) 40.0 and over, adult: Secondary | ICD-10-CM

## 2021-07-31 DIAGNOSIS — I10 Essential (primary) hypertension: Secondary | ICD-10-CM | POA: Diagnosis not present

## 2021-07-31 MED ORDER — TELMISARTAN 20 MG PO TABS: 10.0000 mg | ORAL_TABLET | Freq: Every day | ORAL | 0 refills | Status: DC

## 2021-07-31 NOTE — Progress Notes (Signed)
Chief Complaint:   OBESITY Tiffany Norton is here to discuss her progress with her obesity treatment plan along with follow-up of her obesity related diagnoses. Tiffany Norton is on the Category 3 Plan and states she is following her eating plan approximately 50% of the time. Tiffany Norton states she is not currently exercising.  Today's visit was #: 35 Starting weight: 290 lbs Starting date: 09/08/2019 Today's weight: 249 lbs Today's date: 07/31/2021 Total lbs lost to date: 41 Total lbs lost since last in-office visit: 0  Interim History: Tiffany Norton had a low key Thanksgiving. She went out to dinner for her birthday and otherwise has struggled to be as adherent to the plan as she would have liked. Pt wants to try to stay as compliant as she can over the next few weeks.  Subjective:   1. Essential hypertension BP controlled today. Pt denies chest pain/chest pressure/headache.  2. Serum calcium elevated Pt's last calcium was 10.8 and PTH within normal limits at 30. She is not on calcium sparing meds.  Assessment/Plan:   1. Essential hypertension Tiffany Norton is working on healthy weight loss and exercise to improve blood pressure control. We will watch for signs of hypotension as she continues her lifestyle modifications.  Refill- telmisartan (MICARDIS) 20 MG tablet; Take 0.5 tablets (10 mg total) by mouth daily.  Dispense: 45 tablet; Refill: 0  2. Serum calcium elevated Check labs today.  - PTH, Intact and Calcium - Phosphorus  3. Obesity with current BMI 40.3  Tiffany Norton is currently in the action stage of change. As such, her goal is to continue with weight loss efforts. She has agreed to the Category 3 Plan.   Exercise goals: No exercise has been prescribed at this time.  Behavioral modification strategies: increasing lean protein intake, meal planning and cooking strategies, and keeping healthy foods in the home.  Tiffany Norton has agreed to follow-up with our clinic in 4 weeks. She was informed of  the importance of frequent follow-up visits to maximize her success with intensive lifestyle modifications for her multiple health conditions.   Objective:   Blood pressure 121/80, pulse 88, temperature 98.1 F (36.7 C), height 5\' 6"  (1.676 m), weight 249 lb (112.9 kg), SpO2 100 %. Body mass index is 40.19 kg/m.  General: Cooperative, alert, well developed, in no acute distress. HEENT: Conjunctivae and lids unremarkable. Cardiovascular: Regular rhythm.  Lungs: Normal work of breathing. Neurologic: No focal deficits.   Lab Results  Component Value Date   CREATININE 1.00 04/26/2021   BUN 22 04/18/2021   NA 139 04/18/2021   K 4.6 04/18/2021   CL 104 04/18/2021   CO2 20 04/18/2021   Lab Results  Component Value Date   ALT 18 04/18/2021   AST 16 04/18/2021   ALKPHOS 168 (H) 04/18/2021   BILITOT 0.5 04/18/2021   Lab Results  Component Value Date   HGBA1C 5.7 (H) 03/19/2021   HGBA1C 5.9 (H) 08/29/2020   HGBA1C 5.7 (H) 01/27/2020   HGBA1C 5.8 (H) 09/08/2019   Lab Results  Component Value Date   INSULIN 16.4 03/19/2021   INSULIN 10.5 08/29/2020   INSULIN 22.2 01/27/2020   INSULIN 21.1 09/08/2019   Lab Results  Component Value Date   TSH 1.570 09/08/2019   Lab Results  Component Value Date   CHOL 165 03/19/2021   HDL 47 03/19/2021   LDLCALC 101 (H) 03/19/2021   TRIG 89 03/19/2021   Lab Results  Component Value Date   VD25OH 49.9 03/19/2021   VD25OH  36.3 08/29/2020   VD25OH 40.9 01/27/2020   Lab Results  Component Value Date   WBC 10.1 09/08/2019   HGB 15.1 09/08/2019   HCT 43.8 09/08/2019   MCV 87 09/08/2019   PLT 320 09/08/2019    Attestation Statements:   Reviewed by clinician on day of visit: allergies, medications, problem list, medical history, surgical history, family history, social history, and previous encounter notes.  Edmund Hilda, CMA, am acting as transcriptionist for Reuben Likes, MD.   I have reviewed the above documentation  for accuracy and completeness, and I agree with the above. - Reuben Likes, MD

## 2021-08-01 ENCOUNTER — Encounter (INDEPENDENT_AMBULATORY_CARE_PROVIDER_SITE_OTHER): Payer: Self-pay | Admitting: Family Medicine

## 2021-08-01 LAB — PTH, INTACT AND CALCIUM
Calcium: 10.8 mg/dL — ABNORMAL HIGH (ref 8.7–10.3)
PTH: 46 pg/mL (ref 15–65)

## 2021-08-01 LAB — PHOSPHORUS: Phosphorus: 3.1 mg/dL (ref 3.0–4.3)

## 2021-08-01 NOTE — Telephone Encounter (Signed)
Please advise 

## 2021-08-02 ENCOUNTER — Encounter (INDEPENDENT_AMBULATORY_CARE_PROVIDER_SITE_OTHER): Payer: Self-pay | Admitting: Family Medicine

## 2021-08-02 NOTE — Telephone Encounter (Signed)
Please advise 

## 2021-08-28 ENCOUNTER — Other Ambulatory Visit: Payer: Self-pay

## 2021-08-28 ENCOUNTER — Encounter (INDEPENDENT_AMBULATORY_CARE_PROVIDER_SITE_OTHER): Payer: Self-pay | Admitting: Family Medicine

## 2021-08-28 ENCOUNTER — Ambulatory Visit (INDEPENDENT_AMBULATORY_CARE_PROVIDER_SITE_OTHER): Payer: 59 | Admitting: Family Medicine

## 2021-08-28 VITALS — BP 132/82 | HR 94 | Temp 98.2°F | Ht 66.0 in | Wt 253.0 lb

## 2021-08-28 DIAGNOSIS — E669 Obesity, unspecified: Secondary | ICD-10-CM | POA: Diagnosis not present

## 2021-08-28 DIAGNOSIS — R7303 Prediabetes: Secondary | ICD-10-CM

## 2021-08-28 DIAGNOSIS — I10 Essential (primary) hypertension: Secondary | ICD-10-CM

## 2021-08-28 DIAGNOSIS — Z6841 Body Mass Index (BMI) 40.0 and over, adult: Secondary | ICD-10-CM

## 2021-08-28 MED ORDER — TELMISARTAN 20 MG PO TABS: 10.0000 mg | ORAL_TABLET | Freq: Every day | ORAL | 0 refills | Status: AC

## 2021-08-30 NOTE — Progress Notes (Signed)
Chief Complaint:   OBESITY Tiffany Norton is here to discuss her progress with her obesity treatment plan along with follow-up of her obesity related diagnoses. Tiffany Norton is on the Category 3 Plan and states she is following her eating plan approximately 40% of the time. Tiffany Norton states she is not currently exercising.  Today's visit was #: 36 Starting weight: 290 lbs Starting date: 09/08/2019 Today's weight: 253 lbs Today's date: 08/28/2021 Total lbs lost to date: 37 Total lbs lost since last in-office visit: 0  Interim History: Pt had a great holiday season with family. She recognizes she may have eaten and drank more than she wanted. Pt reports loss of motivation, especially since the holidays; hasn't been able to get back on track. She has no upcoming plans for the next few weeks. Pt may be interested in trying a different meal plan.   Subjective:   1. Essential hypertension BP within normal limits. Pt denies chest pain/chest pressure/headache. She is on Micardis (1/2 tab).  2. Prediabetes Pt's last A1c was 5.7. She was previously on weight loss meds. Pt reports an increase in drive for carbs.  Assessment/Plan:   1. Essential hypertension Tiffany Norton is working on healthy weight loss and exercise to improve blood pressure control. We will watch for signs of hypotension as she continues her lifestyle modifications.  Refill- telmisartan (MICARDIS) 20 MG tablet; Take 0.5 tablets (10 mg total) by mouth daily.  Dispense: 45 tablet; Refill: 0  2. Prediabetes Tiffany Norton will continue to work on weight loss, exercise, and decreasing simple carbohydrates to help decrease the risk of diabetes. Repeat labs in February.  3. Obesity with current BMI 41.0  Tiffany Norton is currently in the action stage of change. As such, her goal is to continue with weight loss efforts. She has agreed to following a lower carbohydrate, vegetable and lean protein rich diet plan.   Exercise goals: All adults should avoid  inactivity. Some physical activity is better than none, and adults who participate in any amount of physical activity gain some health benefits.  Behavioral modification strategies: increasing lean protein intake, meal planning and cooking strategies, keeping healthy foods in the home, and planning for success.  Tiffany Norton has agreed to follow-up with our clinic in 2 weeks. She was informed of the importance of frequent follow-up visits to maximize her success with intensive lifestyle modifications for her multiple health conditions.   Objective:   Blood pressure 132/82, pulse 94, temperature 98.2 F (36.8 C), height 5\' 6"  (1.676 m), weight 253 lb (114.8 kg), SpO2 99 %. Body mass index is 40.84 kg/m.  General: Cooperative, alert, well developed, in no acute distress. HEENT: Conjunctivae and lids unremarkable. Cardiovascular: Regular rhythm.  Lungs: Normal work of breathing. Neurologic: No focal deficits.   Lab Results  Component Value Date   CREATININE 1.00 04/26/2021   BUN 22 04/18/2021   NA 139 04/18/2021   K 4.6 04/18/2021   CL 104 04/18/2021   CO2 20 04/18/2021   Lab Results  Component Value Date   ALT 18 04/18/2021   AST 16 04/18/2021   ALKPHOS 168 (H) 04/18/2021   BILITOT 0.5 04/18/2021   Lab Results  Component Value Date   HGBA1C 5.7 (H) 03/19/2021   HGBA1C 5.9 (H) 08/29/2020   HGBA1C 5.7 (H) 01/27/2020   HGBA1C 5.8 (H) 09/08/2019   Lab Results  Component Value Date   INSULIN 16.4 03/19/2021   INSULIN 10.5 08/29/2020   INSULIN 22.2 01/27/2020   INSULIN 21.1 09/08/2019  Lab Results  Component Value Date   TSH 1.570 09/08/2019   Lab Results  Component Value Date   CHOL 165 03/19/2021   HDL 47 03/19/2021   LDLCALC 101 (H) 03/19/2021   TRIG 89 03/19/2021   Lab Results  Component Value Date   VD25OH 49.9 03/19/2021   VD25OH 36.3 08/29/2020   VD25OH 40.9 01/27/2020   Lab Results  Component Value Date   WBC 10.1 09/08/2019   HGB 15.1 09/08/2019    HCT 43.8 09/08/2019   MCV 87 09/08/2019   PLT 320 09/08/2019    Attestation Statements:   Reviewed by clinician on day of visit: allergies, medications, problem list, medical history, surgical history, family history, social history, and previous encounter notes.  Edmund Hilda, CMA, am acting as transcriptionist for Reuben Likes, MD.  I have reviewed the above documentation for accuracy and completeness, and I agree with the above. - Reuben Likes, MD

## 2021-09-11 ENCOUNTER — Ambulatory Visit (INDEPENDENT_AMBULATORY_CARE_PROVIDER_SITE_OTHER): Payer: 59 | Admitting: Family Medicine

## 2021-09-13 ENCOUNTER — Encounter (INDEPENDENT_AMBULATORY_CARE_PROVIDER_SITE_OTHER): Payer: Self-pay | Admitting: Family Medicine

## 2021-09-13 ENCOUNTER — Ambulatory Visit (INDEPENDENT_AMBULATORY_CARE_PROVIDER_SITE_OTHER): Payer: 59 | Admitting: Family Medicine

## 2021-09-13 ENCOUNTER — Other Ambulatory Visit: Payer: Self-pay

## 2021-09-13 VITALS — BP 137/88 | HR 94 | Temp 98.5°F | Ht 66.0 in | Wt 254.0 lb

## 2021-09-13 DIAGNOSIS — E669 Obesity, unspecified: Secondary | ICD-10-CM

## 2021-09-13 DIAGNOSIS — I10 Essential (primary) hypertension: Secondary | ICD-10-CM | POA: Diagnosis not present

## 2021-09-13 DIAGNOSIS — Z6841 Body Mass Index (BMI) 40.0 and over, adult: Secondary | ICD-10-CM

## 2021-09-13 DIAGNOSIS — E7849 Other hyperlipidemia: Secondary | ICD-10-CM | POA: Diagnosis not present

## 2021-09-13 NOTE — Progress Notes (Signed)
Chief Complaint:   OBESITY Tiffany Norton is here to discuss her progress with her obesity treatment plan along with follow-up of her obesity related diagnoses. Tiffany Norton is on following a lower carbohydrate, vegetable and lean protein rich diet plan and states she is following her eating plan approximately 85% of the time. Tiffany Norton states she is swimming at the [facility] ? minutes 4-5 times per week.  Today's visit was #: 58 Starting weight: 290 lbs Starting date: 09/08/2019 Today's weight: 254 lbs Today's date: 09/13/2021 Total lbs lost to date: 36 Total lbs lost since last in-office visit: 0  Interim History: Pt did not like low carb, as it was not structured enough. She did follow it to the best of her ability and does not want to continue low carb. The next few weeks, she wants to make a oint to keep up consistency with the pool at [facility]. Pt has quite a bit of dental work coming up. Pt wants to get back to category 3.  Subjective:   1. Essential hypertension BP controlled today. Pt denies chest pain/chest pressure/headache.  2. Other hyperlipidemia Pt has an LDL of 101, HDL 47, and triglycerides 89 and she is not on statin.  Assessment/Plan:   1. Essential hypertension Tiffany Norton is working on healthy weight loss and exercise to improve blood pressure control. We will watch for signs of hypotension as she continues her lifestyle modifications. Continue Micardis with no change in dose.  2. Other hyperlipidemia Cardiovascular risk and specific lipid/LDL goals reviewed.  We discussed several lifestyle modifications today and Tiffany Norton will continue to work on diet, exercise and weight loss efforts. Orders and follow up as documented in patient record. Repeat labs in March.  Counseling Intensive lifestyle modifications are the first line treatment for this issue. Dietary changes: Increase soluble fiber. Decrease simple carbohydrates. Exercise changes: Moderate to vigorous-intensity aerobic  activity 150 minutes per week if tolerated. Lipid-lowering medications: see documented in medical record.  3. Obesity with current BMI 41.0 Tiffany Norton is currently in the action stage of change. As such, her goal is to continue with weight loss efforts. She has agreed to the Category 3 Plan.   Exercise goals:  As is  Behavioral modification strategies: increasing lean protein intake, meal planning and cooking strategies, emotional eating strategies, and planning for success.  Tiffany Norton has agreed to follow-up with our clinic in 2 weeks. She was informed of the importance of frequent follow-up visits to maximize her success with intensive lifestyle modifications for her multiple health conditions.   Objective:   Blood pressure 137/88, pulse 94, temperature 98.5 F (36.9 C), height 5\' 6"  (1.676 m), weight 254 lb (115.2 kg), SpO2 99 %. Body mass index is 41 kg/m.  General: Cooperative, alert, well developed, in no acute distress. HEENT: Conjunctivae and lids unremarkable. Cardiovascular: Regular rhythm.  Lungs: Normal work of breathing. Neurologic: No focal deficits.   Lab Results  Component Value Date   CREATININE 1.00 04/26/2021   BUN 22 04/18/2021   NA 139 04/18/2021   K 4.6 04/18/2021   CL 104 04/18/2021   CO2 20 04/18/2021   Lab Results  Component Value Date   ALT 18 04/18/2021   AST 16 04/18/2021   ALKPHOS 168 (H) 04/18/2021   BILITOT 0.5 04/18/2021   Lab Results  Component Value Date   HGBA1C 5.7 (H) 03/19/2021   HGBA1C 5.9 (H) 08/29/2020   HGBA1C 5.7 (H) 01/27/2020   HGBA1C 5.8 (H) 09/08/2019   Lab Results  Component  Value Date   INSULIN 16.4 03/19/2021   INSULIN 10.5 08/29/2020   INSULIN 22.2 01/27/2020   INSULIN 21.1 09/08/2019   Lab Results  Component Value Date   TSH 1.570 09/08/2019   Lab Results  Component Value Date   CHOL 165 03/19/2021   HDL 47 03/19/2021   LDLCALC 101 (H) 03/19/2021   TRIG 89 03/19/2021   Lab Results  Component Value Date    VD25OH 49.9 03/19/2021   VD25OH 36.3 08/29/2020   VD25OH 40.9 01/27/2020   Lab Results  Component Value Date   WBC 10.1 09/08/2019   HGB 15.1 09/08/2019   HCT 43.8 09/08/2019   MCV 87 09/08/2019   PLT 320 09/08/2019     Attestation Statements:   Reviewed by clinician on day of visit: allergies, medications, problem list, medical history, surgical history, family history, social history, and previous encounter notes.  Coral Ceo, CMA, am acting as transcriptionist for Coralie Common, MD.  I have reviewed the above documentation for accuracy and completeness, and I agree with the above. - Coralie Common, MD

## 2021-09-20 NOTE — Progress Notes (Signed)
66 y.o. G56P1001 Married Caucasian female here for annual exam.    Going to [facility].   Reports vulva is irritated all the time. Uses Dove soap.  No pad use.  Has rash when the weather is warm.   [facility] [place], Dr. Hyacinth Meeker.   No LMP recorded. Patient is postmenopausal.           Sexually active: Yes.    The current method of family planning is post menopausal status.    Exercising: Yes.     swimming Smoker:  Former  Health Maintenance: Pap:  09-19-20 Neg:Neg HR HPV, 04-18-16 Neg:Neg HR HPV, 10-27-14 Neg History of abnormal Pap:  Unsure.  CIN I on chart review. MMG: 09-22-21 pend--[facility] Colonoscopy:  ~2017 normal;does every 5 years due to family history colon cancer--mom--Pt. To set up.  Dr. Ewing Schlein.  BMD:    2014 Result :Normal TDaP:  PCP Gardasil:   no HIV:no Hep C:no Screening Labs:  PCP Flu vaccine:  Declined.  Covid booster:  one.  Shingrix:  not completed.    reports that she quit smoking about 8 years ago. Her smoking use included cigarettes. She has never used smokeless tobacco. She reports current alcohol use of about 4.0 standard drinks per week. She reports that she does not use drugs.  Past Medical History:  Diagnosis Date   Acid reflux    Arthritis    Back pain    Bilateral swelling of feet    CIN I (cervical intraepithelial neoplasia I)    Constipation    Depression    Elevated cholesterol    Hyperlipidemia    Hypertension    IBS (irritable bowel syndrome)    Joint pain    Knee pain    Obesity    Plantar fasciitis    PMDD (premenstrual dysphoric disorder)    Prediabetes    Serum calcium elevated    Normal PTH    Past Surgical History:  Procedure Laterality Date   ANAL FISSURE REPAIR     ANKLE SURGERY     Arm Surg     CERVICAL BIOPSY  W/ LOOP ELECTRODE EXCISION  2006   CIN l   CESAREAN SECTION     COLPOSCOPY     TUBAL LIGATION      Current Outpatient Medications  Medication Sig Dispense Refill    Cholecalciferol (VITAMIN D) 50 MCG (2000 UT) CAPS Take 1 capsule by mouth daily.     Multiple Vitamin (MULTIVITAMIN ADULT PO) Take by mouth.     telmisartan (MICARDIS) 20 MG tablet Take 0.5 tablets (10 mg total) by mouth daily. 45 tablet 0   venlafaxine (EFFEXOR) 75 MG tablet Take 1 tablet (75 mg total) by mouth daily. 90 tablet 3   No current facility-administered medications for this visit.    Family History  Problem Relation Age of Onset   Hypertension Mother    Ovarian cancer Mother    Colon cancer Mother    Cancer Mother    Depression Mother    Obesity Mother    Aortic aneurysm Father    Hyperlipidemia Father    Sudden death Father     Review of Systems  All other systems reviewed and are negative.  Exam:   BP 118/76    Pulse 97    Ht 5\' 3"  (1.6 m)    Wt 253 lb (114.8 kg)    SpO2 99%    BMI 44.82 kg/m  General appearance: alert, cooperative and appears stated age Head: normocephalic, without obvious abnormality, atraumatic Neck: no adenopathy, supple, symmetrical, trachea midline and thyroid normal to inspection and palpation Lungs: clear to auscultation bilaterally Breasts: normal appearance, no masses or tenderness, No nipple retraction or dimpling on right, mild left nipple inversion on left (old change), No nipple discharge or bleeding, No axillary adenopathy Heart: regular rate and rhythm Abdomen: pannus.  Abdomen is soft, non-tender; no masses, no organomegaly Extremities: extremities normal, atraumatic, no cyanosis or edema Skin: skin color, texture, turgor normal. No rashes or lesions Lymph nodes: cervical, supraclavicular, and axillary nodes normal. Neurologic: grossly normal  Pelvic: External genitalia:  generalized erythema of vulva.              No abnormal inguinal nodes palpated.              Urethra:  normal appearing urethra with no masses, tenderness or lesions              Bartholins and Skenes: normal                 Vagina: atrophy noted.   Yellowish-green thin discharge.                Cervix: no lesions              Pap taken: no Bimanual Exam:  Uterus:  normal size, contour, position, consistency, mobility, non-tender              Adnexa: no mass, fullness, tenderness              Rectal exam: yes.  Confirms.              Anus:  normal sphincter tone, no lesions  Chaperone was present for exam:  Marchelle Folks, CMA  Assessment:   Well woman visit with gynecologic exam. Remote hx CIN I.  Status post LEEP 2006.  CIN I. FH colon metastatic to ovary in mother.  Depression.  On Effexor. Vulvovaginitis.  Candida of flexural folds.  Plan: Mammogram screening discussed. Self breast awareness reviewed. Pap and HR HPV 2025. Guidelines for Calcium, Vitamin D, regular exercise program including cardiovascular and weight bearing exercise. Wet prep:  negative yeast, positive clue cells, and negative trichomonas.  Rx for Flagyl 500 mg po bid x 7 days.  Rx for Diflucan 150 mg po x 1.  May repeat in 72 hours prn.   Rx for Nystatin powder.  FU in 10 - 14 days for a recheck.  BMD ordered.  Follow up annually and prn.   After visit summary provided.

## 2021-09-22 ENCOUNTER — Encounter (INDEPENDENT_AMBULATORY_CARE_PROVIDER_SITE_OTHER): Payer: Self-pay | Admitting: Family Medicine

## 2021-09-24 ENCOUNTER — Other Ambulatory Visit: Payer: Self-pay

## 2021-09-24 ENCOUNTER — Encounter: Payer: Self-pay | Admitting: Obstetrics and Gynecology

## 2021-09-24 ENCOUNTER — Ambulatory Visit (INDEPENDENT_AMBULATORY_CARE_PROVIDER_SITE_OTHER): Payer: 59 | Admitting: Obstetrics and Gynecology

## 2021-09-24 VITALS — BP 118/76 | HR 97 | Ht 63.0 in | Wt 253.0 lb

## 2021-09-24 DIAGNOSIS — Z01419 Encounter for gynecological examination (general) (routine) without abnormal findings: Secondary | ICD-10-CM | POA: Diagnosis not present

## 2021-09-24 DIAGNOSIS — N760 Acute vaginitis: Secondary | ICD-10-CM | POA: Diagnosis not present

## 2021-09-24 LAB — WET PREP FOR TRICH, YEAST, CLUE

## 2021-09-24 MED ORDER — FLUCONAZOLE 150 MG PO TABS: 150.0000 mg | ORAL_TABLET | Freq: Once | ORAL | 0 refills | Status: AC

## 2021-09-24 MED ORDER — METRONIDAZOLE 500 MG PO TABS: 500.0000 mg | ORAL_TABLET | Freq: Two times a day (BID) | ORAL | 0 refills | Status: DC

## 2021-09-24 MED ORDER — NYSTATIN 100000 UNIT/GM EX POWD: 1.0000 "application " | Freq: Three times a day (TID) | CUTANEOUS | 3 refills | Status: DC

## 2021-09-24 NOTE — Patient Instructions (Signed)

## 2021-09-26 ENCOUNTER — Encounter: Payer: Self-pay | Admitting: Obstetrics and Gynecology

## 2021-09-27 ENCOUNTER — Ambulatory Visit (INDEPENDENT_AMBULATORY_CARE_PROVIDER_SITE_OTHER): Payer: 59 | Admitting: Family Medicine

## 2021-10-08 ENCOUNTER — Encounter: Payer: Self-pay | Admitting: Obstetrics and Gynecology

## 2021-10-08 ENCOUNTER — Other Ambulatory Visit: Payer: Self-pay

## 2021-10-08 ENCOUNTER — Ambulatory Visit: Payer: 59 | Admitting: Obstetrics and Gynecology

## 2021-10-08 VITALS — BP 136/76 | HR 85 | Ht 63.0 in | Wt 253.0 lb

## 2021-10-08 DIAGNOSIS — N763 Subacute and chronic vulvitis: Secondary | ICD-10-CM

## 2021-10-08 MED ORDER — CLOTRIMAZOLE-BETAMETHASONE 1-0.05 % EX CREA: 1.0000 "application " | TOPICAL_CREAM | Freq: Two times a day (BID) | CUTANEOUS | 1 refills | Status: DC

## 2021-10-08 NOTE — Progress Notes (Signed)
GYNECOLOGY  VISIT   HPI: 66 y.o.   Married  Caucasian  female   G1P1001 with No LMP recorded. Patient is postmenopausal.   here for follow up on vulvovaginitis. Patient states vulva still irritated.  Used Flagyl and Diflucan.  Uses Nystatin prn.   GYNECOLOGIC HISTORY: No LMP recorded. Patient is postmenopausal. Contraception:  PMP Menopausal hormone therapy:  none Last mammogram:  09-22-21 scattered calcifications both breast/Neg/Birads2 Last pap smear:    09-19-20 Neg:Neg HR HPV, 04-18-16 Neg:Neg HR HPV, 10-27-14 Neg        OB History     Gravida  1   Para  1   Term  1   Preterm      AB      Living  1      SAB      IAB      Ectopic      Multiple      Live Births                 Patient Active Problem List   Diagnosis Date Noted   Vitamin D deficiency 02/16/2020   Prediabetes 02/16/2020   B12 deficiency 02/16/2020   Class 3 severe obesity with serious comorbidity and body mass index (BMI) of 40.0 to 44.9 in adult [facility]) 02/16/2020   Essential hypertension 04/18/2016   Other hyperlipidemia    CIN I (cervical intraepithelial neoplasia I)    PMDD (premenstrual dysphoric disorder)    IBS (irritable bowel syndrome)     Past Medical History:  Diagnosis Date   Acid reflux    Arthritis    Back pain    Bilateral swelling of feet    CIN I (cervical intraepithelial neoplasia I)    Constipation    Depression    Elevated cholesterol    Hyperlipidemia    Hypertension    IBS (irritable bowel syndrome)    Joint pain    Knee pain    Obesity    Plantar fasciitis    PMDD (premenstrual dysphoric disorder)    Prediabetes    Serum calcium elevated    Normal PTH    Past Surgical History:  Procedure Laterality Date   ANAL FISSURE REPAIR     ANKLE SURGERY     Arm Surg     CERVICAL BIOPSY  W/ LOOP ELECTRODE EXCISION  2006   CIN l   CESAREAN SECTION     COLPOSCOPY     TUBAL LIGATION      Current Outpatient Medications  Medication Sig Dispense Refill    Cholecalciferol (VITAMIN D) 50 MCG (2000 UT) CAPS Take 1 capsule by mouth daily.     Multiple Vitamin (MULTIVITAMIN ADULT PO) Take by mouth.     nystatin (MYCOSTATIN/NYSTOP) powder Apply 1 application topically 3 (three) times daily. Apply to affected area for up to 7 days 15 g 3   pravastatin (PRAVACHOL) 40 MG tablet 1 tablet     telmisartan (MICARDIS) 20 MG tablet Take 0.5 tablets (10 mg total) by mouth daily. 45 tablet 0   venlafaxine (EFFEXOR) 75 MG tablet Take 1 tablet (75 mg total) by mouth daily. 90 tablet 3   No current facility-administered medications for this visit.     ALLERGIES: Aspirin, Etodolac, and Nsaids  Family History  Problem Relation Age of Onset   Hypertension Mother    Ovarian cancer Mother    Colon cancer Mother    Cancer Mother    Depression Mother    Obesity Mother  Aortic aneurysm Father    Hyperlipidemia Father    Sudden death Father     Social History   Socioeconomic History   Marital status: Married    Spouse name: Not on file   Number of children: Not on file   Years of education: Not on file   Highest education level: Not on file  Occupational History   Not on file  Tobacco Use   Smoking status: Former    Types: Cigarettes    Quit date: 2015    Years since quitting: 8.1   Smokeless tobacco: Never  Vaping Use   Vaping Use: Never used  Substance and Sexual Activity   Alcohol use: Yes    Alcohol/week: 4.0 standard drinks    Types: 4 Glasses of wine per week   Drug use: No   Sexual activity: Yes    Birth control/protection: Surgical    Comment: intercourse early 20's, less than 5 sexual partners  Other Topics Concern   Not on file  Social History Narrative   Not on file   Social Determinants of Health   Financial Resource Strain: Not on file  Food Insecurity: Not on file  Transportation Needs: Not on file  Physical Activity: Not on file  Stress: Not on file  Social Connections: Not on file  Intimate Partner Violence: Not on  file    Review of Systems  Genitourinary:  Positive for vaginal pain (vulvar irritation).  All other systems reviewed and are negative.  PHYSICAL EXAMINATION:    BP 136/76    Pulse 85    Ht 5\' 3"  (1.6 m)    Wt 253 lb (114.8 kg)    SpO2 95%    BMI 44.82 kg/m     General appearance: alert, cooperative and appears stated age   Pelvic: External genitalia: erythema of the vulva - labia, perineum, and perianal region.  Some hypopigmentation of the perianal area also.               Urethra:  normal appearing urethra with no masses, tenderness or lesions              Bartholins and Skenes: normal                 Vagina: normal appearing vagina with normal color and yellowish discharge, no lesions              Cervix: no lesions                Bimanual Exam:  Uterus:  normal size, contour, position, consistency, mobility, non-tender              Adnexa: no mass, fullness, tenderness     Chaperone was present for exam:  , CMA  ASSESSMENT  Chronic vulvitis.   PLAN  We discussed possible etiologies of the vulvar changes:  psoriasis, eczema, lichen planus.  Will treat with Lotrisone cream bid x 2 weeks and then follow up for recheck.  If not improved, will plan for vulvar and perianal biopsies.   Vulvar biopsy explained.    An After Visit Summary was printed and given to the patient.

## 2021-10-09 ENCOUNTER — Other Ambulatory Visit: Payer: Self-pay

## 2021-10-09 DIAGNOSIS — Z13820 Encounter for screening for osteoporosis: Secondary | ICD-10-CM

## 2021-10-30 ENCOUNTER — Other Ambulatory Visit: Payer: Self-pay

## 2021-10-30 ENCOUNTER — Encounter: Payer: Self-pay | Admitting: Obstetrics and Gynecology

## 2021-10-30 ENCOUNTER — Other Ambulatory Visit (HOSPITAL_COMMUNITY)
Admission: RE | Admit: 2021-10-30 | Discharge: 2021-10-30 | Disposition: A | Discharge status: Home / Self Care | Payer: 59 | Source: Ambulatory Visit | Attending: Obstetrics and Gynecology | Admitting: Obstetrics and Gynecology

## 2021-10-30 ENCOUNTER — Ambulatory Visit: Payer: 59 | Admitting: Obstetrics and Gynecology

## 2021-10-30 DIAGNOSIS — N763 Subacute and chronic vulvitis: Secondary | ICD-10-CM

## 2021-10-30 DIAGNOSIS — L814 Other melanin hyperpigmentation: Secondary | ICD-10-CM

## 2021-10-30 NOTE — Progress Notes (Signed)
GYNECOLOGY  VISIT ?  ?HPI: ?66 y.o.   Married  Caucasian  female   ?G1P1001 with No LMP recorded. Patient is postmenopausal.   ?here for vulvar biopsy due to chronic vulvitis.  ? ?Vulva is less irritated with the Lotrisone.   ? ?GYNECOLOGIC HISTORY: ?No LMP recorded. Patient is postmenopausal. ?Contraception:  PMP ?Menopausal hormone therapy:  none ?Last mammogram:   09-22-21 scattered calcifications both breast/Neg/Birads2 ?Last pap smear:   09-19-20 Neg:Neg HR HPV, 04-18-16 Neg:Neg HR HPV, 10-27-14 Neg ?       ?OB History   ? ? Gravida  ?1  ? Para  ?1  ? Term  ?1  ? Preterm  ?   ? AB  ?   ? Living  ?1  ?  ? ? SAB  ?   ? IAB  ?   ? Ectopic  ?   ? Multiple  ?   ? Live Births  ?   ?   ?  ?  ?    ? ?Patient Active Problem List  ? Diagnosis Date Noted  ? Vitamin D deficiency 02/16/2020  ? Prediabetes 02/16/2020  ? B12 deficiency 02/16/2020  ? Class 3 severe obesity with serious comorbidity and body mass index (BMI) of 40.0 to 44.9 in adult [facility]) 02/16/2020  ? Essential hypertension 04/18/2016  ? Other hyperlipidemia   ? CIN I (cervical intraepithelial neoplasia I)   ? PMDD (premenstrual dysphoric disorder)   ? IBS (irritable bowel syndrome)   ? ? ?Past Medical History:  ?Diagnosis Date  ? Acid reflux   ? Arthritis   ? Back pain   ? Bilateral swelling of feet   ? CIN I (cervical intraepithelial neoplasia I)   ? Constipation   ? Depression   ? Elevated cholesterol   ? Hyperlipidemia   ? Hypertension   ? IBS (irritable bowel syndrome)   ? Joint pain   ? Knee pain   ? Obesity   ? Plantar fasciitis   ? PMDD (premenstrual dysphoric disorder)   ? Prediabetes   ? Serum calcium elevated   ? Normal PTH  ? ? ?Past Surgical History:  ?Procedure Laterality Date  ? ANAL FISSURE REPAIR    ? ANKLE SURGERY    ? Arm Surg    ? CERVICAL BIOPSY  W/ LOOP ELECTRODE EXCISION  2006  ? CIN l  ? CESAREAN SECTION    ? COLPOSCOPY    ? TUBAL LIGATION    ? ? ?Current Outpatient Medications  ?Medication Sig Dispense Refill  ? Cholecalciferol (VITAMIN D) 50  MCG (2000 UT) CAPS Take 1 capsule by mouth daily.    ? clotrimazole-betamethasone (LOTRISONE) cream Apply 1 application topically 2 (two) times daily. Use for for two weeks as needed. 30 g 1  ? Multiple Vitamin (MULTIVITAMIN ADULT PO) Take by mouth.    ? nystatin (MYCOSTATIN/NYSTOP) powder Apply 1 application topically 3 (three) times daily. Apply to affected area for up to 7 days 15 g 3  ? pravastatin (PRAVACHOL) 40 MG tablet 1 tablet    ? telmisartan (MICARDIS) 20 MG tablet Take 0.5 tablets (10 mg total) by mouth daily. 45 tablet 0  ? venlafaxine (EFFEXOR) 75 MG tablet Take 1 tablet (75 mg total) by mouth daily. 90 tablet 3  ? ?No current facility-administered medications for this visit.  ?  ? ?ALLERGIES: Aspirin, Etodolac, and Nsaids ? ?Family History  ?Problem Relation Age of Onset  ? Hypertension Mother   ? Ovarian cancer Mother   ?  Colon cancer Mother   ? Cancer Mother   ? Depression Mother   ? Obesity Mother   ? Aortic aneurysm Father   ? Hyperlipidemia Father   ? Sudden death Father   ? ? ?Social History  ? ?Socioeconomic History  ? Marital status: Married  ?  Spouse name: Not on file  ? Number of children: Not on file  ? Years of education: Not on file  ? Highest education level: Not on file  ?Occupational History  ? Not on file  ?Tobacco Use  ? Smoking status: Former  ?  Types: Cigarettes  ?  Quit date: 2015  ?  Years since quitting: 8.2  ? Smokeless tobacco: Never  ?Vaping Use  ? Vaping Use: Never used  ?Substance and Sexual Activity  ? Alcohol use: Yes  ?  Alcohol/week: 4.0 standard drinks  ?  Types: 4 Glasses of wine per week  ? Drug use: No  ? Sexual activity: Yes  ?  Birth control/protection: Surgical  ?  Comment: intercourse early 20's, less than 5 sexual partners  ?Other Topics Concern  ? Not on file  ?Social History Narrative  ? Not on file  ? ?Social Determinants of Health  ? ?Financial Resource Strain: Not on file  ?Food Insecurity: Not on file  ?Transportation Needs: Not on file  ?Physical  Activity: Not on file  ?Stress: Not on file  ?Social Connections: Not on file  ?Intimate Partner Violence: Not on file  ? ? ?Review of Systems  ?All other systems reviewed and are negative. ? ?PHYSICAL EXAMINATION:   ? ?BP 140/76   Pulse 83   Ht 5\' 3"  (1.6 m)   Wt 253 lb (114.8 kg)   SpO2 98%   BMI 44.82 kg/m?     ?General appearance: alert, cooperative and appears stated age ? ?Pelvic: External genitalia:  hypopigmentation of the superior bilateral labia minora, Erythema of the bilateral labia majora and below this area as well.  Perianal hypopigmentation in patches.  ?             Urethra:  normal appearing urethra with no masses, tenderness or lesions ?           ?Vulvar and perianal biopsies.  ?Consent done.  ?Sterile prep with betadine.  ?Local 1% lidocaine GF 3482, exp 12/18/22.  ?3 mm punch biopsy of the left labia majora and then right perianal region.  ?Tissue to pathology separately.  ?Silver nitrate used.  ?Single 3/0 Vicryl sutures.  ?Minimal EBL. ?No complications. ? ?Chaperone was present for exam:  02/17/23, CMA ? ?ASSESSMENT ? ?Chronic vulvitis.  ? ?PLAN ? ?FU biopsies.  ?Return for suture removal in 10 days if desired.  ?Final plan to follow  ?  ?An After Visit Summary was printed and given to the patient. ? ? ? ?

## 2021-10-30 NOTE — Patient Instructions (Signed)
Vulva Biopsy, Care After ?This sheet gives you information about how to care for yourself after your procedure. Your health care provider may also give you more specific instructions. If you have problems or questions, contact your health care provider. ?What can I expect after the procedure? ?After the procedure, it is common to have: ?Slight bleeding from the biopsy site. ?Discomfort at the biopsy site. ?Follow these instructions at home: ?Biopsy site care ? ?Follow instructions from your health care provider about how to take care of your biopsy site. Make sure you: ?Clean the area using water and mild soap twice a day or as told by your health care provider. Gently pat the area dry. ?If you were prescribed an antibiotic ointment, apply it as told by your health care provider. Do not stop using the antibiotic even if your condition improves. ?Take a warm water bath (sitz bath) as needed to help with pain and discomfort. A sitz bath is taken while you are sitting down. The water should only come up to your hips and should cover your buttocks. ?Leave stitches (sutures), skin glue, or adhesive strips in place. These skin closures may need to stay in place for 2 weeks or longer. If adhesive strip edges start to loosen and curl up, you may trim the loose edges. Do not remove adhesive strips completely unless your health care provider tells you to do that. ?Check your biopsy site every day for signs of infection. Check for: ?More redness, swelling, or pain. ?More fluid or blood. ?Warmth. ?Pus or a bad smell. ?Do not rub the biopsy area after urinating. Gently pat the area dry or use a bottle filled with warm water (peri-bottle) to clean the area. Gently wipe from front to back. ?Lifestyle ?Wear loose, cotton underwear. Do not wear tight pants. ?Do not use a tampon, douche, or put anything inside your vagina for at least 1 week or until your health care provider approves. ?Do not have sex for at least 1 week or until  your health care provider approves. ?Do not exercise, such as running or biking, until your health care provider approves. ?Do not swim or use a hot tub until your health care provider approves. You may shower or take a sitz bath. ?General instructions ?Take over-the-counter and prescription medicines only as told by your health care provider. ?Use a sanitary napkin until the bleeding stops. ?Keep all follow-up visits as told by your health care provider. This is important. ?Contact a health care provider if: ?You have more redness, swelling, or pain around your biopsy site. ?You have more fluid or blood coming from your biopsy site. ?Your biopsy site feels warm to the touch. ?Your pain is not controlled with medicine. ?Get help right away if you have: ?Heavy bleeding from the vulva. ?Pus or a bad smell coming from your biopsy site. ?A fever. ?Lower abdominal pain. ?Summary ?After the procedure, it is common to have slight bleeding and discomfort at the biopsy site. ?Follow instructions from your health care provider after your biopsy. Make sure you clean the area with water and mild soap. Pat the area dry. ?Take sitz baths as needed to help with pain and discomfort. Leave any sutures in place. ?Check your biopsy site for signs of infection, which may include more redness, swelling, pain, fluid, or blood, or feeling warm to the touch. ?Get help right away if you have heavy bleeding, a fever, pus or a bad smell, or pain in the lower abdomen. ?This information is   not intended to replace advice given to you by your health care provider. Make sure you discuss any questions you have with your health care provider. ?Document Revised: 02/04/2018 Document Reviewed: 02/05/2018 ?Elsevier Patient Education ? 2022 [employer] ? ?

## 2021-11-02 ENCOUNTER — Encounter: Payer: Self-pay | Admitting: Obstetrics and Gynecology

## 2021-11-06 ENCOUNTER — Encounter: Payer: Self-pay | Admitting: Obstetrics and Gynecology

## 2021-11-06 LAB — SURGICAL PATHOLOGY

## 2021-11-10 MED ORDER — CLOBETASOL PROPIONATE 0.05 % EX OINT: 1.0000 "application " | TOPICAL_OINTMENT | Freq: Two times a day (BID) | CUTANEOUS | 1 refills | Status: DC

## 2021-11-10 NOTE — Addendum Note (Signed)
Addended by: Ardell Isaacs, Debbe Bales E on: 11/10/2021 09:25 AM ? ? Modules accepted: Orders ? ?

## 2021-11-13 ENCOUNTER — Other Ambulatory Visit: Payer: Self-pay | Admitting: Obstetrics and Gynecology

## 2021-11-13 ENCOUNTER — Other Ambulatory Visit: Payer: Self-pay

## 2021-11-13 ENCOUNTER — Ambulatory Visit (INDEPENDENT_AMBULATORY_CARE_PROVIDER_SITE_OTHER): Payer: 59

## 2021-11-13 DIAGNOSIS — M8588 Other specified disorders of bone density and structure, other site: Secondary | ICD-10-CM

## 2021-11-13 DIAGNOSIS — Z780 Asymptomatic menopausal state: Secondary | ICD-10-CM | POA: Diagnosis not present

## 2021-11-13 DIAGNOSIS — Z13820 Encounter for screening for osteoporosis: Secondary | ICD-10-CM

## 2021-11-16 ENCOUNTER — Encounter: Payer: Self-pay | Admitting: Obstetrics and Gynecology

## 2021-11-16 NOTE — Telephone Encounter (Signed)
Called patient and yes, she is referring to clobetasol ointment. Patient said this is the only new medication she has started. She took benadryl and is fine now, she is going to stop the medication and wait to hear from Dr.Silva about other options next week.  ?

## 2021-11-16 NOTE — Telephone Encounter (Signed)
Please call the patient and speak with her. If she is referring to the clobetasol that Dr Edward Jolly prescribed, I wouldn't expect application of steroid ointment to her vulva would cause hives elsewhere.  ?She should be seen by her primary or urgent care ?

## 2021-11-17 NOTE — Telephone Encounter (Signed)
I agree that it would not be common to develop a rash with use of a steroid ointment.  ? ?Nevertheless, please remove Clobetasol from her medication list and add it as an allergy.   ? ?She did not develop a rash when she used Lotrisone, so I would recommend using Valisone ointment as her steroid of use to treat the vulvitis. ?Lotrisone and Valisone both have betamethasone steroid in them.  ? ?Rx for Valisone 0.1% ointment, apply to area twice daily x 2 weeks for a flare.  Then apply twice weekly at bedtime for maintenance dosing.  ?Disp:  45 grams ?RF: one.  ?

## 2021-11-19 MED ORDER — BETAMETHASONE VALERATE 0.1 % EX OINT: TOPICAL_OINTMENT | CUTANEOUS | 1 refills | Status: DC

## 2021-12-02 ENCOUNTER — Encounter: Payer: Self-pay | Admitting: Obstetrics and Gynecology

## 2021-12-24 ENCOUNTER — Encounter: Payer: Self-pay | Admitting: Obstetrics and Gynecology

## 2021-12-24 ENCOUNTER — Ambulatory Visit: Payer: 59 | Admitting: Obstetrics and Gynecology

## 2021-12-24 VITALS — BP 118/80 | Ht 63.0 in | Wt 253.0 lb

## 2021-12-24 DIAGNOSIS — N763 Subacute and chronic vulvitis: Secondary | ICD-10-CM

## 2021-12-24 MED ORDER — FLUCONAZOLE 150 MG PO TABS: 150.0000 mg | ORAL_TABLET | Freq: Once | ORAL | 0 refills | Status: AC

## 2021-12-24 NOTE — Progress Notes (Signed)
GYNECOLOGY  VISIT ?  ?HPI: ?66 y.o.   Married  Caucasian  female   ?G1P1001 with No LMP recorded. Patient is postmenopausal.   ?here for follow up on vulvar biopsy. Stopped Clobetasol because she broke out in hives.  ? ?She used Clobetasol ointment for a week or two.  ?She was also prescribed Valisone, which she stopped due to a second bout of hives.  ?She saw her PCP and was placed on prednisone. ? ?Has chronic vulvovaginitis. ?Vulvar and perianal biopsies on 123456 showed lichenoid dermatitis.  ? ?Vulva is still itchy.  ? ?Has Candida of flexural folds of skin.  ? ?No urinary incontinence or pad use.  ? ?GYNECOLOGIC HISTORY: ?No LMP recorded. Patient is postmenopausal. ?Contraception:  PMP ?Menopausal hormone therapy:  none ?Last mammogram:  09-22-21 scattered calcifications both breast/Neg/Birads2 ?Last pap smear:    09-19-20 Neg:Neg HR HPV, 04-18-16 Neg:Neg HR HPV, 10-27-14 Neg ?       ?OB History   ? ? Gravida  ?1  ? Para  ?1  ? Term  ?1  ? Preterm  ?   ? AB  ?   ? Living  ?1  ?  ? ? SAB  ?   ? IAB  ?   ? Ectopic  ?   ? Multiple  ?   ? Live Births  ?   ?   ?  ?  ?    ? ?Patient Active Problem List  ? Diagnosis Date Noted  ? Vitamin D deficiency 02/16/2020  ? Prediabetes 02/16/2020  ? B12 deficiency 02/16/2020  ? Class 3 severe obesity with serious comorbidity and body mass index (BMI) of 40.0 to 44.9 in adult [facility]) 02/16/2020  ? Essential hypertension 04/18/2016  ? Other hyperlipidemia   ? CIN I (cervical intraepithelial neoplasia I)   ? PMDD (premenstrual dysphoric disorder)   ? IBS (irritable bowel syndrome)   ? ? ?Past Medical History:  ?Diagnosis Date  ? Acid reflux   ? Arthritis   ? Back pain   ? Bilateral swelling of feet   ? CIN I (cervical intraepithelial neoplasia I)   ? Constipation   ? Depression   ? Elevated cholesterol   ? Hyperlipidemia   ? Hypertension   ? IBS (irritable bowel syndrome)   ? Joint pain   ? Knee pain   ? Obesity   ? Plantar fasciitis   ? PMDD (premenstrual dysphoric disorder)   ?  Prediabetes   ? Serum calcium elevated   ? Normal PTH  ? ? ?Past Surgical History:  ?Procedure Laterality Date  ? ANAL FISSURE REPAIR    ? ANKLE SURGERY    ? Arm Surg    ? CERVICAL BIOPSY  W/ LOOP ELECTRODE EXCISION  2006  ? CIN l  ? CESAREAN SECTION    ? COLPOSCOPY    ? TUBAL LIGATION    ? ? ?Current Outpatient Medications  ?Medication Sig Dispense Refill  ? Cholecalciferol (VITAMIN D) 50 MCG (2000 UT) CAPS Take 1 capsule by mouth daily.    ? diphenhydrAMINE (BENADRYL) 25 MG tablet 1 tablet    ? Multiple Vitamin (MULTIVITAMIN ADULT PO) Take by mouth.    ? nystatin (MYCOSTATIN/NYSTOP) powder Apply 1 application topically 3 (three) times daily. Apply to affected area for up to 7 days 15 g 3  ? omeprazole (PRILOSEC) 20 MG capsule 1 capsule 30 minutes before morning meal    ? pravastatin (PRAVACHOL) 40 MG tablet 1 tablet    ?  predniSONE (DELTASONE) 20 MG tablet 1 tablet    ? telmisartan (MICARDIS) 20 MG tablet Take 0.5 tablets (10 mg total) by mouth daily. 45 tablet 0  ? venlafaxine (EFFEXOR) 75 MG tablet 1 tablet with food    ? ?No current facility-administered medications for this visit.  ?  ? ?ALLERGIES: Temovate [clobetasol], Aspirin, Etodolac, and Nsaids ? ?Family History  ?Problem Relation Age of Onset  ? Hypertension Mother   ? Ovarian cancer Mother   ? Colon cancer Mother   ? Cancer Mother   ? Depression Mother   ? Obesity Mother   ? Aortic aneurysm Father   ? Hyperlipidemia Father   ? Sudden death Father   ? ? ?Social History  ? ?Socioeconomic History  ? Marital status: Married  ?  Spouse name: Not on file  ? Number of children: Not on file  ? Years of education: Not on file  ? Highest education level: Not on file  ?Occupational History  ? Not on file  ?Tobacco Use  ? Smoking status: Former  ?  Types: Cigarettes  ?  Quit date: 2015  ?  Years since quitting: 8.3  ? Smokeless tobacco: Never  ?Vaping Use  ? Vaping Use: Never used  ?Substance and Sexual Activity  ? Alcohol use: Yes  ?  Alcohol/week: 4.0 standard  drinks  ?  Types: 4 Glasses of wine per week  ? Drug use: No  ? Sexual activity: Yes  ?  Birth control/protection: Surgical  ?  Comment: intercourse early 20's, less than 5 sexual partners  ?Other Topics Concern  ? Not on file  ?Social History Narrative  ? Not on file  ? ?Social Determinants of Health  ? ?Financial Resource Strain: Not on file  ?Food Insecurity: Not on file  ?Transportation Needs: Not on file  ?Physical Activity: Not on file  ?Stress: Not on file  ?Social Connections: Not on file  ?Intimate Partner Violence: Not on file  ? ? ?Review of Systems  ?All other systems reviewed and are negative. ? ?PHYSICAL EXAMINATION:   ? ?BP 118/80   Ht 5\' 3"  (1.6 m)   Wt 253 lb (114.8 kg)   BMI 44.82 kg/m?     ?General appearance: alert, cooperative and appears stated age ? ?Pelvic: External genitalia:  erythema of the vulva. Generalized. ?             Urethra:  normal appearing urethra with no masses, tenderness or lesions ?             Bartholins and Skenes: normal    ?             Vagina: normal appearing vagina with normal color and discharge, no lesions ?             Cervix: no lesions ?               ?Bimanual Exam:  Uterus:  normal size, contour, position, consistency, mobility, non-tender ?             Adnexa: no mass, fullness, tenderness ?  ?Chaperone was present for exam:  yes. ? ?ASSESSMENT ? ?Chronic vulvitis.  ?Lichenoid dermatitis.  ?Hives.  Unknown cause.  ?Recent steroids.  ? ?PLAN ? ?We discussed possible vulvar irritants to avoid.  ?I recommended over the counter hydrocortisone cream or zinc oxide barrier cream for potential symptom treatment.  ?Rx for a course of Diflucan 150 mg po x 1.  May repeat in 72 hours  prn.  ?If she has another bout of hives, she will see an allergist for testing.  ?Fu here prn.  ?  ?An After Visit Summary was printed and given to the patient. ? ?25 min  total time was spent for this patient encounter, including preparation, face-to-face counseling with the patient,  coordination of care, and documentation of the encounter. ? ? ?

## 2022-01-02 ENCOUNTER — Encounter: Payer: Self-pay | Admitting: Internal Medicine

## 2022-01-02 ENCOUNTER — Ambulatory Visit: Payer: 59 | Admitting: Internal Medicine

## 2022-01-02 VITALS — BP 130/88 | HR 105 | Temp 97.2°F | Resp 18 | Ht 65.5 in | Wt 270.7 lb

## 2022-01-02 DIAGNOSIS — L501 Idiopathic urticaria: Secondary | ICD-10-CM | POA: Diagnosis not present

## 2022-01-02 DIAGNOSIS — J3089 Other allergic rhinitis: Secondary | ICD-10-CM | POA: Insufficient documentation

## 2022-01-02 MED ORDER — FLUTICASONE PROPIONATE 50 MCG/ACT NA SUSP: 1.0000 | Freq: Every day | NASAL | 2 refills | Status: DC

## 2022-01-02 NOTE — Patient Instructions (Addendum)
Chronic Idiopathic Urticaria: Uncontrolled ?-Grass exposure May be contributing to this as well ?-I do not think you have a drug allergy to the clobetasol or other medications used for the vulvitis treatment, your gynecologist can reintroduce these as needed for treatment of gynecologic disease ?- this is defined as hives lasting more than 6 weeks without an identifiable trigger ?- hives can be from a number of different sources including infections, allergies, vibration, temperature, pressure among many others other possible causes ?- often an identifiable cause is not determined ?- some potential triggers include: stress, illness, NSAIDs, aspirin, hormonal changes ?- you do not have any red flag symptoms to make [place] concerned about secondary causes of hives, but we will screen for these for reassurance with: CBC w diff, CMP, tryptase, TSH, hive panel, alpha-gal panel, inflammatory markers ?- approximately 50% of patients with chronic hives can have some associated swelling of the face/lips/eyelids (this is not a cause for alarm and does not typically progress onto systemic allergic reactions) ? ?Therapy Plan:  ?- start zyrtec (cetirizine) 10mg  once twice a day  ?- if hives are uncontrolled, increase zyrtec (cetirizine) to 20mg  twice daily  ?- if hives remain uncontrolled, increase dose of zyrtec (cetirizine) to max dose of 20mg  (2 pills) twice daily- this is maximum dose ?- can increase or decrease dosing depending on symptom control to a maximum dose of 4 tablets of antihistamine daily. Wait until hives free for at least one month prior to decreasing dose.   ?- if hives are still uncontrolled with the above regimen, please arrange an appointment for discussion of Xolair (omalizumab)- an injectable medication for hives ? ?Please see this website for in depth information on use of xolair in chronic hives  ? ?https://hernandez-anderson.info/ ? ? ?Can use one of the  following in place of zyrtec if desires: Claritin (loratadine) 10 mg, Xyzal (levocetirizine) 5 mg or Allegra (fexofenadine) 180 mg daily as needed ? ? ?Seasonal allergic rhinitis: Moderately well controlled  ?- Testing today showed positive to grass and weeds ?- Copy of test results provided.  ?- Avoidance measures provided. ?- Stop taking: Allegra and Benadryl ?- Start taking:  Zyrtec 10 mg twice daily (this will treat chronic hives and rhinitis) , you can add Flonase 50 mcg 1 spray per each nostril daily as needed for stuffy nose ?- Consider nasal saline rinses 1-2 times daily to remove allergens from the nasal cavities as well as help with mucous clearance (this is especially helpful to do before the nasal sprays are given) ?- Consider allergy shots as a means of long-term control and can reduce lifetime use of medications  ?- Allergy shots "re-train" and "reset" the immune system to ignore environmental allergens and decrease the resulting immune response to those allergens (sneezing, itchy watery eyes, runny nose, nasal congestion, etc).    ?- Allergy shots improve symptoms in 75-85%  ?- Allergy shots are the only potential permanent and disease modifying option  ?- We can discuss more at the next appointment if the medications are not working for you. ? ? ?Follow up: 4 weeks  ? ?Thank you so much for letting me partake in your care today.  Don't hesitate to reach out if you have any additional concerns! ? ?[name], MD  ?[facility] [place] [place] ? ?

## 2022-01-02 NOTE — Progress Notes (Signed)
? ?New Patient Note ? ?RE: Tiffany Norton MRN: [id number] DOB: 10/25/1955 ?Date of Office Visit: 01/02/2022 ? ?Consult requested by: No ref. provider found ?Primary care provider: Pcp, No ? ?Chief Complaint: Urticaria (X 5 weeks random) ? ?History of Present Illness: ?I had the pleasure of seeing Tiffany Norton for initial evaluation at the [facility] on 01/02/2022. She is a 65 y.o. female, who is referred here by Pcp, No for the evaluation of urticaria . ? ?Reports onset of rasied, erythematous pruritic macules and papules that occur daily over the past 5 weeks.  Will take Allegra or Benadryl in AM, will good response, but recur at night.  She has noticed worsening pruritus with outdoor exposure.  PCP prescribed prednisone which had a initially good response with rebound after she stopped prednisone.  She has tried Allegra twice a day without good control, as well Benadryl every 4 hours.  ? ?Denies any associated food, medication or contact exposure.  Denies any association with sun, cold, heat, exercise, pressure, water or vibration exposure.  Lesions are not painful or fixed.  Denies any associated fevers or arthritis. ? ?Chronic rhinitis: started as a young adult  ?Symptoms include: nasal congestion, rhinorrhea, post nasal drainage, sneezing, watery eyes, itchy eyes, and itchy nose  ?Occurs seasonally-spring time  ?Potential triggers: cat, pollen  ?Treatments tried: OTC antihistamines, does not like to take daily medication  ?Previous allergy testing: yes: pollens, foods- cannot recall details.  Denies any food reactions.  ?History of reflux/heartburn: no ?History of chronic sinusitis or sinus surgery: no ?Nonallergic triggers:  Denies    ? ? ?Assessment and Plan: ?Tiffany Norton is a 65 y.o. female with: ?Chronic idiopathic urticaria - Plan: Allergy Test, CBC With Differential, CMP14+EGFR, TSH, Alpha-Gal Panel, Tryptase, Chronic Urticaria, C-reactive protein ? ?Other allergic  rhinitis ?Plan: ?Patient Instructions  ?Chronic Idiopathic Urticaria: Uncontrolled ?-Grass exposure May be contributing to this as well ?-I do not think you have a drug allergy to the clobetasol or other medications used for the vulvitis treatment, your gynecologist can reintroduce these as needed for treatment of gynecologic disease ?- this is defined as hives lasting more than 6 weeks without an identifiable trigger ?- hives can be from a number of different sources including infections, allergies, vibration, temperature, pressure among many others other possible causes ?- often an identifiable cause is not determined ?- some potential triggers include: stress, illness, NSAIDs, aspirin, hormonal changes ?- you do not have any red flag symptoms to make us concerned about secondary causes of hives, but we will screen for these for reassurance with: CBC w diff, CMP, tryptase, TSH, hive panel, alpha-gal panel, inflammatory markers ?- approximately 50% of patients with chronic hives can have some associated swelling of the face/lips/eyelids (this is not a cause for alarm and does not typically progress onto systemic allergic reactions) ? ?Therapy Plan:  ?- start zyrtec (cetirizine) 10mg once twice a day  ?- if hives are uncontrolled, increase zyrtec (cetirizine) to 20mg twice daily  ?- if hives remain uncontrolled, increase dose of zyrtec (cetirizine) to max dose of 20mg (2 pills) twice daily- this is maximum dose ?- can increase or decrease dosing depending on symptom control to a maximum dose of 4 tablets of antihistamine daily. Wait until hives free for at least one month prior to decreasing dose.   ?- if hives are still uncontrolled with the above regimen, please arrange an appointment for discussion of Xolair (omalizumab)- an injectable medication for hives ? ?  Please see this website for in depth information on use of xolair in chronic hives   ? ?https://www.xolair.com/csu/what-is-chronic-spontaneous-urticaria/overview-of-csu.html ? ? ?Can use one of the following in place of zyrtec if desires: Claritin (loratadine) 10 mg, Xyzal (levocetirizine) 5 mg or Allegra (fexofenadine) 180 mg daily as needed ? ? ?Seasonal allergic rhinitis: Moderately well controlled  ?- Testing today showed positive to grass and weeds ?- Copy of test results provided.  ?- Avoidance measures provided. ?- Stop taking: Allegra and Benadryl ?- Start taking:  Zyrtec 10 mg twice daily (this will treat chronic hives and rhinitis) , you can add Flonase 50 mcg 1 spray per each nostril daily as needed for stuffy nose ?- Consider nasal saline rinses 1-2 times daily to remove allergens from the nasal cavities as well as help with mucous clearance (this is especially helpful to do before the nasal sprays are given) ?- Consider allergy shots as a means of long-term control and can reduce lifetime use of medications  ?- Allergy shots "re-train" and "reset" the immune system to ignore environmental allergens and decrease the resulting immune response to those allergens (sneezing, itchy watery eyes, runny nose, nasal congestion, etc).    ?- Allergy shots improve symptoms in 75-85%  ?- Allergy shots are the only potential permanent and disease modifying option  ?- We can discuss more at the next appointment if the medications are not working for you. ? ? ?Follow up: 4 weeks  ? ?Thank you so much for letting me partake in your care today.  Don't hesitate to reach out if you have any additional concerns! ? ?Evelyn Lomasney, MD  ?[facility] [place], [place] ? ?Return in about 4 weeks (around 01/30/2022). ? ?Meds ordered this encounter  ?Medications  ? fluticasone (FLONASE) 50 MCG/ACT nasal spray  ?  Sig: Place 1 spray into both nostrils daily.  ?  Dispense:  16 g  ?  Refill:  2  ? ?Lab Orders    ?     CBC With Differential    ?     CMP14+EGFR    ?     TSH    ?     Alpha-Gal Panel    ?      Tryptase    ?     Chronic Urticaria    ?     C-reactive protein    ? ? ?Other allergy screening: ?Asthma: no ?Rhino conjunctivitis: yes ?Food allergy: no ?Medication allergy: yes: Ibuprofen allergy- urticaria and LOC, has tolerated aleve, peptobismuth  ?Hymenoptera allergy: no ?Urticaria: yes ?Eczema:yes: grew out of it mostly, now with mild symptoms on her finger  ?History of recurrent infections suggestive of immunodeficency: no ? ?Diagnostics ?Skin Testing: Environmental allergy panel. ?Positive grass, weeds ?Results interpreted by myself and discussed with patient/family. ? Airborne Adult Perc - 01/02/22 1502   ? ? Time Antigen Placed 1502   ? Allergen Manufacturer Greer   ? Location Back   ? Number of Test 59   ? 1. Control-Buffer 50% Glycerol Negative   ? 2. Control-Histamine 1 mg/ml 4+   ? 3. Albumin saline Negative   ? 4. Bahia 3+   ? 5. Bermuda 3+   ? 6. Johnson 3+   ? 7. [place] Blue 3+   ? 8. Meadow Fescue Negative   ? 9. Perennial Rye Negative   ? 10. Sweet Vernal Negative   ? 11. Timothy Negative   ? 12. Cocklebur Negative   ? 13. Burweed Marshelder   3+   ? 14. Ragweed, short 4+   ? 15. Ragweed, Giant Negative   ? 16. Plantain,  English 4+   ? 17. Lamb's Quarters 4+   ? 18. Sheep Sorrell 3+   ? 19. Rough Pigweed 3+   ? 20. Marsh Elder, Rough Negative   ? 21. Mugwort, Common Negative   ? 22. Ash mix Negative   ? 23. Wendee Copp mix Negative   ? 24. Beech American Negative   ? 25. Box, Elder Negative   ? 26. Cedar, red Negative   ? 27. Cottonwood, [place] Negative   ? 28. Elm mix Negative   ? 29. Hickory Negative   ? 30. Maple mix Negative   ? 31. Oak, [place] mix Negative   ? 32. Pecan Pollen Negative   ? 33. Pine mix Negative   ? 34. Sycamore Eastern Negative   ? 35. Walnut, Black Pollen Negative   ? 36. Alternaria alternata Negative   ? 54. Cladosporium Herbarum Negative   ? 38. Aspergillus mix Negative   ? 39. Penicillium mix Negative   ? 40. Bipolaris sorokiniana (Helminthosporium) Negative   ? 41.  Drechslera spicifera (Curvularia) Negative   ? 42. Mucor plumbeus Negative   ? 43. Fusarium moniliforme Negative   ? 44. Aureobasidium pullulans (pullulara) Negative   ? 45. Rhizopus oryzae Negative   ? 46. Botrytis cinera Negative   ? 47. Epicoccum nigrum Negative   ? 48. Phoma betae Negati

## 2022-01-06 ENCOUNTER — Encounter: Payer: Self-pay | Admitting: Internal Medicine

## 2022-01-09 ENCOUNTER — Encounter: Payer: Self-pay | Admitting: Internal Medicine

## 2022-01-09 NOTE — Progress Notes (Signed)
Labs returned with normal blood counts, normal inflammatory markers , normal thyroid markers, normal tryptase (which if abnormal is a marker for rare causes of hives).  However your calcium level was slightly elevated calcium and slightly elevated alkaline phosphatase.   Other liver enzymes were normal.  Patient was called and informed of the results and instructed to follow-up with primary care.  Calcium alkaline phosphatase.  Given both calcium and ALP can come from bone she was instructed to follow up with primary care for further work up.  She voiced understanding of plan.

## 2022-01-16 ENCOUNTER — Encounter: Payer: Self-pay | Admitting: Internal Medicine

## 2022-01-16 ENCOUNTER — Ambulatory Visit: Payer: 59 | Admitting: Internal Medicine

## 2022-01-16 VITALS — BP 136/80 | HR 105 | Temp 98.0°F | Resp 17 | Wt 269.2 lb

## 2022-01-16 DIAGNOSIS — J3089 Other allergic rhinitis: Secondary | ICD-10-CM

## 2022-01-16 DIAGNOSIS — L501 Idiopathic urticaria: Secondary | ICD-10-CM | POA: Diagnosis not present

## 2022-01-16 MED ORDER — EPINEPHRINE 0.3 MG/0.3ML IJ SOAJ: 0.3000 mg | INTRAMUSCULAR | 1 refills | Status: AC | PRN

## 2022-01-16 NOTE — Patient Instructions (Addendum)
Urticaria - not well controlled, despite 4 times a day zyrtec and twice a day famotidine -Continue Zyrtec 10 mg 1-2 tabs twice daily -Stop Pepcid/famotidine as this may be contributing to GI symptoms -We will start omalizumab/Xolair 300 mg subcu every 4 weeks -Counseled on the risk and benefits of omalizumab to include 1 hour observation period  - Due to risk of allergic reactions, we do prescribe an epipen autoinjector for you to carry on xolair days  - Consent signed and obtained in clinic today  Allergic rhinitis: Moderately controlled  - Previous testing positive to grass, weeds-2023 - Continue Avoidance measures - Continue with:  Zyrtec 10mg  1-2 tabs twice daily and Flonase (fluticasone) one spray per nostril daily - You can use an extra dose of the antihistamine, if needed, for breakthrough symptoms.    Follow up in 3 months in clinic Follow up on Friday at 3:00pm for initial xolair injection

## 2022-01-16 NOTE — Progress Notes (Signed)
Follow Up Note  RE: Tiffany Norton MRN: [id number] DOB: July 21, 1956 Date of Office Visit: 01/16/2022  Referring provider: No ref. provider found Primary care provider: Pcp, No  Chief Complaint: Urticaria (/)  History of Present Illness: I had the pleasure of seeing Tiffany Norton for a follow up visit at the [facility] on 01/16/2022. She is a 66 y.o. female, who is being followed for urticaria and allergic rhinitis. Her previous allergy office visit was on 01/02/2022 with Dr. Marlynn Perking. Today is a regular follow up visit.  History obtained from patient.  Urticaria:  At last visit she was skin test positive to grass and weeds.  She was started on Zyrtec 10 mg twice daily and labs obtained which were only remarkable for an elevated calcium and slightly elevated alkaline phosphatase.  She was instructed to follow-up with primary care for further work-up.  Regards urticaria she reports increasing Zyrtec to 20 mg twice daily and starting famotidine 20 mg twice daily.  She still reports persistent daily urticaria and she is interested in [place] therapy.  Also reports increase in stomach pain and diarrhea since Thursday.  She is concerned that this may be related to antihistamine regimen.  Chronic Rhinitis current therapy: Zyrtec 10 mg 1-2 times daily, Flonase 50 mcg 1 spray per each nostril daily,  symptoms partially improved symptoms include:  nasal congestion  Previous allergy testing:  Skin testing positive grass and weeds-2023 History of reflux/heartburn: no Interested in Allergy Immunotherapy: no   Assessment and Plan: Tiffany Norton is a 66 y.o. female with: Idiopathic urticaria  Other allergic rhinitis Plan: Patient Instructions  Urticaria - not well controlled, despite 4 times a day zyrtec and twice a day famotidine -Continue Zyrtec 10 mg 1-2 tabs twice daily -Stop Pepcid/famotidine as this may be contributing to GI symptoms -We will start omalizumab/Xolair  300 mg subcu every 4 weeks -Counseled on the risk and benefits of omalizumab to include 1 hour observation period  - Due to risk of allergic reactions, we do prescribe an epipen autoinjector for you to carry on xolair days   Allergic rhinitis: Moderately controlled  - Previous testing positive to grass, weeds-2023 - Continue Avoidance measures - Continue with:  Zyrtec 10mg  1-2 tabs twice daily and Flonase (fluticasone) one spray per nostril daily - You can use an extra dose of the antihistamine, if needed, for breakthrough symptoms.    Follow up in 3 months in clinic Follow up on Friday at 3:00pm for initial xolair injection  No follow-ups on file.  Meds ordered this encounter  Medications   EPINEPHrine 0.3 mg/0.3 mL IJ SOAJ injection    Sig: Inject 0.3 mg into the muscle as needed for anaphylaxis.    Dispense:  1 each    Refill:  1    Lab Orders  No laboratory test(s) ordered today   Diagnostics: None performed    Medication List:  Current Outpatient Medications  Medication Sig Dispense Refill   Cholecalciferol (VITAMIN D) 50 MCG (2000 UT) CAPS Take 1 capsule by mouth daily.     diphenhydrAMINE (BENADRYL) 25 MG tablet 1 tablet     EPINEPHrine 0.3 mg/0.3 mL IJ SOAJ injection Inject 0.3 mg into the muscle as needed for anaphylaxis. 1 each 1   fluticasone (FLONASE) 50 MCG/ACT nasal spray Place 1 spray into both nostrils daily. 16 g 2   Multiple Vitamin (MULTIVITAMIN ADULT PO) Take by mouth.     nystatin (MYCOSTATIN/NYSTOP) powder Apply 1 application topically  3 (three) times daily. Apply to affected area for up to 7 days 15 g 3   omeprazole (PRILOSEC) 20 MG capsule 1 capsule 30 minutes before morning meal     pravastatin (PRAVACHOL) 40 MG tablet 1 tablet     predniSONE (DELTASONE) 20 MG tablet 1 tablet     telmisartan (MICARDIS) 20 MG tablet Take 0.5 tablets (10 mg total) by mouth daily. 45 tablet 0   venlafaxine (EFFEXOR) 75 MG tablet 1 tablet with food     No current  facility-administered medications for this visit.   Allergies: Allergies  Allergen Reactions   Temovate [Clobetasol] Hives   Aspirin Hives and Other (See Comments)   Etodolac Hives and Other (See Comments)   Nsaids Other (See Comments)   I reviewed her past medical history, social history, family history, and environmental history and no significant changes have been reported from her previous visit.  ROS: All others negative except as noted per HPI.   Objective: BP 136/80   Pulse (!) 105   Temp 98 F (36.7 C) (Temporal)   Resp 17   Wt 269 lb 3.2 oz (122.1 kg)   SpO2 97%   BMI 44.12 kg/m  Body mass index is 44.12 kg/m. General Appearance:  Alert, cooperative, no distress, appears stated age  Head:  Normocephalic, without obvious abnormality, atraumatic  Eyes:  Conjunctiva clear, EOM's intact  Nose: Nares normal,   Throat: Lips, tongue normal; teeth and gums normal,   Neck: Supple, symmetrical  Lungs:   Respirations unlabored, intermittent dry coughing  Heart:  Appears well perfused  Extremities: No edema  Skin: Skin color, texture, turgor normal,  scattered erythematous macules on arms   Neurologic: No gross deficits   Previous notes and tests were reviewed. The plan was reviewed with the patient/family, and all questions/concerned were addressed.  It was my pleasure to see Tiffany Norton today and participate in her care. Please feel free to contact me with any questions or concerns.  Sincerely,  Ferol Luz, MD  Allergy & Immunology  [facility] Office: [phone]

## 2022-01-17 ENCOUNTER — Ambulatory Visit: Payer: 59

## 2022-01-17 LAB — CMP14+EGFR
ALT: 26 IU/L (ref 0–32)
AST: 18 IU/L (ref 0–40)
Albumin/Globulin Ratio: 2.6 — ABNORMAL HIGH (ref 1.2–2.2)
Albumin: 4.5 g/dL (ref 3.8–4.8)
Alkaline Phosphatase: 135 IU/L — ABNORMAL HIGH (ref 44–121)
BUN/Creatinine Ratio: 19 (ref 12–28)
BUN: 19 mg/dL (ref 8–27)
Bilirubin Total: 0.3 mg/dL (ref 0.0–1.2)
CO2: 21 mmol/L (ref 20–29)
Calcium: 10.5 mg/dL — ABNORMAL HIGH (ref 8.7–10.3)
Chloride: 102 mmol/L (ref 96–106)
Creatinine, Ser: 0.98 mg/dL (ref 0.57–1.00)
Globulin, Total: 1.7 g/dL (ref 1.5–4.5)
Glucose: 103 mg/dL — ABNORMAL HIGH (ref 70–99)
Potassium: 4.3 mmol/L (ref 3.5–5.2)
Sodium: 138 mmol/L (ref 134–144)
Total Protein: 6.2 g/dL (ref 6.0–8.5)
eGFR: 64 mL/min/{1.73_m2} (ref >59)

## 2022-01-17 LAB — CBC WITH DIFFERENTIAL
Basophils Absolute: 0 10*3/uL (ref 0.0–0.2)
Basos: 0 %
EOS (ABSOLUTE): 0 10*3/uL (ref 0.0–0.4)
Eos: 0 %
Hematocrit: 40.4 % (ref 34.0–46.6)
Hemoglobin: 13.6 g/dL (ref 11.1–15.9)
Immature Grans (Abs): 0.1 10*3/uL (ref 0.0–0.1)
Immature Granulocytes: 1 %
Lymphocytes Absolute: 2.7 10*3/uL (ref 0.7–3.1)
Lymphs: 27 %
MCH: 28.4 pg (ref 26.6–33.0)
MCHC: 33.7 g/dL (ref 31.5–35.7)
MCV: 84 fL (ref 79–97)
Monocytes Absolute: 0.8 10*3/uL (ref 0.1–0.9)
Monocytes: 8 %
Neutrophils Absolute: 6.3 10*3/uL (ref 1.4–7.0)
Neutrophils: 64 %
RBC: 4.79 x10E6/uL (ref 3.77–5.28)
RDW: 13.3 % (ref 11.7–15.4)
WBC: 9.9 10*3/uL (ref 3.4–10.8)

## 2022-01-17 LAB — C-REACTIVE PROTEIN: CRP: 8 mg/L (ref 0–10)

## 2022-01-17 LAB — TRYPTASE: Tryptase: 7.6 ug/L (ref 2.2–13.2)

## 2022-01-17 LAB — CHRONIC URTICARIA: cu index: 4.6 (ref <10)

## 2022-01-17 LAB — ALPHA-GAL PANEL
Allergen Lamb IgE: <0.1 kU/L
Beef IgE: <0.1 kU/L
IgE (Immunoglobulin E), Serum: <2 IU/mL — ABNORMAL LOW (ref 6–495)
O215-IgE Alpha-Gal: <0.1 kU/L
Pork IgE: <0.1 kU/L

## 2022-01-17 LAB — TSH: TSH: 1.6 u[IU]/mL (ref 0.450–4.500)

## 2022-01-18 ENCOUNTER — Ambulatory Visit (INDEPENDENT_AMBULATORY_CARE_PROVIDER_SITE_OTHER): Payer: 59

## 2022-01-18 ENCOUNTER — Encounter: Payer: Self-pay | Admitting: Internal Medicine

## 2022-01-18 DIAGNOSIS — L501 Idiopathic urticaria: Secondary | ICD-10-CM

## 2022-01-18 MED ORDER — OMALIZUMAB 150 MG/ML ~~LOC~~ SOSY
150.0000 mg | PREFILLED_SYRINGE | SUBCUTANEOUS | Status: DC
  Administered 2022-01-18: 150 mg via SUBCUTANEOUS

## 2022-01-21 ENCOUNTER — Encounter: Payer: Self-pay | Admitting: Internal Medicine

## 2022-01-21 NOTE — Telephone Encounter (Signed)
Hello Tiffany Norton,  All the labs except the calcium and alkaline phosphatase which were elevated which we already discussed and should be worked up by your primary care physician.  All the other labs including the CBC returned normal.

## 2022-01-28 MED ORDER — OMALIZUMAB 150 MG/ML ~~LOC~~ SOSY
300.0000 mg | PREFILLED_SYRINGE | SUBCUTANEOUS | Status: DC
  Administered 2022-01-18 – 2022-09-04 (×9): 300 mg via SUBCUTANEOUS

## 2022-01-28 NOTE — Addendum Note (Signed)
Addended by: Carin Hock on: 01/28/2022 10:26 AM   Modules accepted: Orders

## 2022-01-29 ENCOUNTER — Telehealth: Payer: Self-pay | Admitting: *Deleted

## 2022-01-29 NOTE — Telephone Encounter (Signed)
-----   Message from Devoria Glassing, [place] sent at 01/28/2022 10:21 AM EDT ----- Otila Back START 6/5

## 2022-01-29 NOTE — Telephone Encounter (Signed)
Called patient and advised approval , copay card and submit to [facility] pharmacy.

## 2022-01-30 ENCOUNTER — Ambulatory Visit: Payer: 59 | Admitting: Internal Medicine

## 2022-02-15 ENCOUNTER — Ambulatory Visit (INDEPENDENT_AMBULATORY_CARE_PROVIDER_SITE_OTHER): Payer: 59

## 2022-02-15 DIAGNOSIS — L501 Idiopathic urticaria: Secondary | ICD-10-CM

## 2022-03-07 ENCOUNTER — Encounter: Payer: Self-pay | Admitting: Internal Medicine

## 2022-03-13 ENCOUNTER — Ambulatory Visit (INDEPENDENT_AMBULATORY_CARE_PROVIDER_SITE_OTHER): Payer: 59

## 2022-03-13 DIAGNOSIS — L501 Idiopathic urticaria: Secondary | ICD-10-CM | POA: Diagnosis not present

## 2022-03-27 ENCOUNTER — Encounter (INDEPENDENT_AMBULATORY_CARE_PROVIDER_SITE_OTHER): Payer: Self-pay

## 2022-04-10 ENCOUNTER — Ambulatory Visit (INDEPENDENT_AMBULATORY_CARE_PROVIDER_SITE_OTHER): Payer: 59

## 2022-04-10 DIAGNOSIS — L501 Idiopathic urticaria: Secondary | ICD-10-CM | POA: Diagnosis not present

## 2022-04-23 ENCOUNTER — Encounter: Payer: Self-pay | Admitting: Internal Medicine

## 2022-04-23 ENCOUNTER — Ambulatory Visit (INDEPENDENT_AMBULATORY_CARE_PROVIDER_SITE_OTHER): Payer: 59 | Admitting: Internal Medicine

## 2022-04-23 VITALS — BP 126/78 | HR 106 | Temp 97.6°F | Resp 20 | Wt 279.7 lb

## 2022-04-23 DIAGNOSIS — J3089 Other allergic rhinitis: Secondary | ICD-10-CM

## 2022-04-23 DIAGNOSIS — L501 Idiopathic urticaria: Secondary | ICD-10-CM

## 2022-04-23 DIAGNOSIS — H1013 Acute atopic conjunctivitis, bilateral: Secondary | ICD-10-CM | POA: Diagnosis not present

## 2022-04-23 DIAGNOSIS — H1045 Other chronic allergic conjunctivitis: Secondary | ICD-10-CM

## 2022-04-23 NOTE — Progress Notes (Signed)
Follow Up Note  RE: Tiffany Norton MRN: [id number] DOB: 1956-01-22 Date of Office Visit: 04/23/2022  Referring provider: No ref. provider found Primary care provider: Pcp, No  Chief Complaint: No chief complaint on file.  History of Present Illness: I had the pleasure of seeing Tiffany Norton for a follow up visit at the [facility] on 04/23/2022. She is a 66 y.o. female, who is being followed for idiopathic urticaria on Xolair, allergic rhinitis. Her previous allergy office visit was on 01/16/2022 with Dr. Marlynn Perking. Today is a regular follow up visit.  History obtained from patient, chart review.  At last visit Xolair was started at 300 mg subcu every 4 weeks.  She was continued on Zyrtec 1-2 tabs twice daily.  Pepcid was discontinued due to concern for GI side effects.  Today she reports urticaria completely resolved a few weeks ago.  She is still is taking zyrtec 10mg  daily.  GI symptoms resolved since stopping pepcid. Denies any side effects from xolair.    She has noticed slight increase in nasal congestion and watery eyes.  This started about mid August.  She still takes her Zyrtec 10 mg daily and Flonase 1 spray per nostril daily and is comfortable with current level of symptom control.  She was positive to weeds on skin testing and reports that fall is usually her more difficult season.   Assessment and Plan: Tiffany Norton is a 66 y.o. female with: Idiopathic urticaria  Other allergic rhinitis  Other chronic allergic conjunctivitis of both eyes Plan: Patient Instructions  Urticaria - well controlled! -Continue Zyrtec 10 mg daily -Continue xolair 300mg  every 4 weeks  -Continue to carry epipen on xolair days and follow allergy action plan   Allergic rhinitis: Moderately controlled, increased symptoms likely due to ragweed season starting  - Previous testing positive to grass, weeds-2023 - Continue Avoidance measures - Continue with:  Zyrtec 10mg  daily  and Flonase (fluticasone) one spray per nostril daily - You can use an extra dose of the antihistamine, if needed, for breakthrough symptoms.   Follow up: in November to ensure good transition with xolair therapy and starting medicare   Thank you so much for letting me partake in your care today.  Don't hesitate to reach out if you have any additional concerns!  , MD  [facility] [place]  No follow-ups on file.  No orders of the defined types were placed in this encounter.   Lab Orders  No laboratory test(s) ordered today   Diagnostics: None done    Medication List:  Current Outpatient Medications  Medication Sig Dispense Refill   Cholecalciferol (VITAMIN D) 50 MCG (2000 UT) CAPS Take 1 capsule by mouth daily.     EPINEPHrine 0.3 mg/0.3 mL IJ SOAJ injection Inject 0.3 mg into the muscle as needed for anaphylaxis. 1 each 1   fluticasone (FLONASE) 50 MCG/ACT nasal spray Place 1 spray into both nostrils daily. 16 g 2   Multiple Vitamin (MULTIVITAMIN ADULT PO) Take by mouth.     omeprazole (PRILOSEC) 20 MG capsule 1 capsule 30 minutes before morning meal     pravastatin (PRAVACHOL) 40 MG tablet 1 tablet     telmisartan (MICARDIS) 20 MG tablet Take 0.5 tablets (10 mg total) by mouth daily. 45 tablet 0   venlafaxine (EFFEXOR) 75 MG tablet 1 tablet with food     diphenhydrAMINE (BENADRYL) 25 MG tablet 1 tablet     nystatin (MYCOSTATIN/NYSTOP) powder  Apply 1 application topically 3 (three) times daily. Apply to affected area for up to 7 days (Patient not taking: Reported on 04/23/2022) 15 g 3   predniSONE (DELTASONE) 20 MG tablet 1 tablet     Current Facility-Administered Medications  Medication Dose Route Frequency Provider Last Rate Last Admin   omalizumab Geoffry Paradise) prefilled syringe 300 mg  300 mg Subcutaneous Q28 days Ferol Luz, MD   300 mg at 04/10/22 1516   Allergies: Allergies  Allergen Reactions   Temovate [Clobetasol] Hives    Aspirin Hives and Other (See Comments)   Etodolac Hives and Other (See Comments)   Nsaids Other (See Comments)   I reviewed her past medical history, social history, family history, and environmental history and no significant changes have been reported from her previous visit.  ROS: All others negative except as noted per HPI.   Objective: BP 126/78 (BP Location: Right Arm, Patient Position: Sitting, Cuff Size: Large)   Pulse (!) 106   Temp 97.6 F (36.4 C) (Temporal)   Resp 20   Wt 279 lb 11.2 oz (126.9 kg)   SpO2 100%   BMI 45.84 kg/m  Body mass index is 45.84 kg/m. General Appearance:  Alert, cooperative, no distress, appears stated age  Head:  Normocephalic, without obvious abnormality, atraumatic  Eyes:  Conjunctiva clear, EOM's intact  Nose: Nares normal, hypertrophic turbinates, no visible anterior polyps, and septum midline  Throat: Lips, tongue normal; teeth and gums normal, normal posterior oropharynx and no tonsillar exudate  Neck: Supple, symmetrical  Lungs:   clear to auscultation bilaterally, Respirations unlabored, no coughing  Heart:  regular rate and rhythm and no murmur, Appears well perfused  Extremities: No edema  Skin: Skin color, texture, turgor normal, no rashes or lesions on visualized portions of skin   Neurologic: No gross deficits   Previous notes and tests were reviewed. The plan was reviewed with the patient/family, and all questions/concerned were addressed.  It was my pleasure to see Tiffany Norton today and participate in her care. Please feel free to contact me with any questions or concerns.  Sincerely,  Ferol Luz, MD  Allergy & Immunology  [facility] Office: [phone]

## 2022-04-23 NOTE — Patient Instructions (Addendum)
Urticaria - well controlled! -Continue Zyrtec 10 mg daily -Continue xolair 300mg  every 4 weeks  -Continue to carry epipen on xolair days and follow allergy action plan   Allergic rhinitis: Moderately controlled, increased symptoms likely due to ragweed season starting  - Previous testing positive to grass, weeds-2023 - Continue Avoidance measures - Continue with:  Zyrtec 10mg  daily and Flonase (fluticasone) one spray per nostril daily - You can use an extra dose of the antihistamine, if needed, for breakthrough symptoms.   Follow up: in November to ensure good transition with xolair therapy and starting medicare   Thank you so much for letting me partake in your care today.  Don't hesitate to reach out if you have any additional concerns!  , MD  [facility] [place] [place]

## 2022-05-10 ENCOUNTER — Ambulatory Visit (INDEPENDENT_AMBULATORY_CARE_PROVIDER_SITE_OTHER): Payer: 59

## 2022-05-10 DIAGNOSIS — L501 Idiopathic urticaria: Secondary | ICD-10-CM | POA: Diagnosis not present

## 2022-05-14 ENCOUNTER — Encounter: Payer: Self-pay | Admitting: Internal Medicine

## 2022-06-07 ENCOUNTER — Ambulatory Visit (INDEPENDENT_AMBULATORY_CARE_PROVIDER_SITE_OTHER): Payer: 59

## 2022-06-07 DIAGNOSIS — L501 Idiopathic urticaria: Secondary | ICD-10-CM | POA: Diagnosis not present

## 2022-06-24 ENCOUNTER — Telehealth: Payer: Self-pay | Admitting: *Deleted

## 2022-06-24 ENCOUNTER — Encounter: Payer: Self-pay | Admitting: Internal Medicine

## 2022-06-24 ENCOUNTER — Ambulatory Visit: Payer: 59 | Admitting: Internal Medicine

## 2022-06-24 VITALS — BP 126/78 | HR 80 | Temp 98.1°F | Resp 17

## 2022-06-24 DIAGNOSIS — L501 Idiopathic urticaria: Secondary | ICD-10-CM | POA: Diagnosis not present

## 2022-06-24 DIAGNOSIS — J3089 Other allergic rhinitis: Secondary | ICD-10-CM | POA: Diagnosis not present

## 2022-06-24 NOTE — Telephone Encounter (Signed)
Called patient and discussed change from commercial Ins to [facility]. She is going to go to [facility] and supplement so I advised her we would transition over to buy and bill at that time

## 2022-06-24 NOTE — Progress Notes (Unsigned)
Follow Up Note  RE: Tiffany Norton MRN: [id number] DOB: 08/09/1956 Date of Office Visit: 06/24/2022  Referring provider: No ref. provider found Primary care provider: Pcp, No  Chief Complaint: No chief complaint on file.  History of Present Illness: I had the pleasure of seeing Tiffany Norton for a follow up visit at the [facility] on 06/24/2022. She is a 66 y.o. female, who is being followed for idiopathic urticaria, allergic rhinitis. Her previous allergy office visit was on 04/23/2022 with Dr. Marlynn Perking. Today is a regular follow up visit.  History obtained from patient, chart review.  Urticaria  -Xolair started on 01/18/2022 -Antihistamine regimen is  Assessment and Plan: Tiffany Norton is a 66 y.o. female with: No diagnosis found. Plan: There are no Patient Instructions on file for this visit. No follow-ups on file.  No orders of the defined types were placed in this encounter.   Lab Orders  No laboratory test(s) ordered today   Diagnostics: Spirometry:  Tracings reviewed. Her effort: {Blank single:19197::"Good reproducible efforts.","It was hard to get consistent efforts and there is a question as to whether this reflects a maximal maneuver.","Poor effort, data can not be interpreted."} FVC: ***L FEV1: ***L, ***% predicted FEV1/FVC ratio: ***% Interpretation: {Blank single:19197::"Spirometry consistent with mild obstructive disease","Spirometry consistent with moderate obstructive disease","Spirometry consistent with severe obstructive disease","Spirometry consistent with possible restrictive disease","Spirometry consistent with mixed obstructive and restrictive disease","Spirometry uninterpretable due to technique","Spirometry consistent with normal pattern","No overt abnormalities noted given today's efforts"}.  Please see scanned spirometry results for details.  Skin Testing: {Blank single:19197::"Select foods","Environmental allergy  panel","Environmental allergy panel and select foods","Food allergy panel","None","Deferred due to recent antihistamines use"}. *** Results interpreted by myself during this encounter and discussed with patient/family.   Medication List:  Current Outpatient Medications  Medication Sig Dispense Refill   Cholecalciferol (VITAMIN D) 50 MCG (2000 UT) CAPS Take 1 capsule by mouth daily.     diphenhydrAMINE (BENADRYL) 25 MG tablet 1 tablet     EPINEPHrine 0.3 mg/0.3 mL IJ SOAJ injection Inject 0.3 mg into the muscle as needed for anaphylaxis. 1 each 1   fluticasone (FLONASE) 50 MCG/ACT nasal spray Place 1 spray into both nostrils daily. 16 g 2   Multiple Vitamin (MULTIVITAMIN ADULT PO) Take by mouth.     nystatin (MYCOSTATIN/NYSTOP) powder Apply 1 application topically 3 (three) times daily. Apply to affected area for up to 7 days (Patient not taking: Reported on 04/23/2022) 15 g 3   omeprazole (PRILOSEC) 20 MG capsule 1 capsule 30 minutes before morning meal     pravastatin (PRAVACHOL) 40 MG tablet 1 tablet     predniSONE (DELTASONE) 20 MG tablet 1 tablet     telmisartan (MICARDIS) 20 MG tablet Take 0.5 tablets (10 mg total) by mouth daily. 45 tablet 0   venlafaxine (EFFEXOR) 75 MG tablet 1 tablet with food     Current Facility-Administered Medications  Medication Dose Route Frequency Provider Last Rate Last Admin   omalizumab Geoffry Paradise) prefilled syringe 300 mg  300 mg Subcutaneous Q28 days Ferol Luz, MD   300 mg at 06/07/22 1531   Allergies: Allergies  Allergen Reactions   Temovate [Clobetasol] Hives   Aspirin Hives and Other (See Comments)   Etodolac Hives and Other (See Comments)   Nsaids Other (See Comments)   I reviewed her past medical history, social history, family history, and environmental history and no significant changes have been reported from her previous visit.  ROS: All others negative except as  noted per HPI.   Objective: There were no vitals taken for this  visit. There is no height or weight on file to calculate BMI. General Appearance:  Alert, cooperative, no distress, appears stated age  Head:  Normocephalic, without obvious abnormality, atraumatic  Eyes:  Conjunctiva clear, EOM's intact  Nose: Nares normal, {Blank multiple:19196:a:"***","hypertrophic turbinates","normal mucosa","no visible anterior polyps","septum midline"}  Throat: Lips, tongue normal; teeth and gums normal, {Blank multiple:19196:a:"***","normal posterior oropharynx","tonsils 2+","tonsils 3+","no tonsillar exudate","+ cobblestoning"}  Neck: Supple, symmetrical  Lungs:   {Blank multiple:19196:a:"***","clear to auscultation bilaterally","end-expiratory wheezing","wheezing throughout"}, Respirations unlabored, {Blank multiple:19196:a:"***","no coughing","intermittent dry coughing"}  Heart:  {Blank multiple:19196:a:"***","regular rate and rhythm","no murmur"}, Appears well perfused  Extremities: No edema  Skin: Skin color, texture, turgor normal, no rashes or lesions on visualized portions of skin {Blank multiple:19196:a:""***"}  Neurologic: No gross deficits   Previous notes and tests were reviewed. The plan was reviewed with the patient/family, and all questions/concerned were addressed.  It was my pleasure to see Tiffany Norton today and participate in her care. Please feel free to contact me with any questions or concerns.  Sincerely,  [name], MD  Allergy & Immunology  [facility] Office: [phone]

## 2022-06-24 NOTE — Telephone Encounter (Signed)
-----   Message from [name], MD sent at 06/24/2022  3:58 PM EST ----- Tiffany Norton is transitioing to [facility] in December.  She is on xolair for chronic hives.  We just want ot make sure there is no gap with the insurance transition

## 2022-06-24 NOTE — Patient Instructions (Signed)
Urticaria -moderately well controlled -We will hold off on cyclosporine for now, would like to give Xolair another 6 months.  If response slows down will consider switching to cyclosporine. -Continue Zyrtec 10 mg daily -Continue xolair 300mg  every 4 weeks  -Continue to carry epipen on xolair days and follow allergy action plan   Allergic rhinitis: Improved - Previous testing positive to grass, weeds-2023 - Continue Avoidance measures - Continue with:  Zyrtec 10mg  daily and Flonase (fluticasone) one spray per nostril daily - You can use an extra dose of the antihistamine, if needed, for breakthrough symptoms.   Follow up: 6 months  Thank you so much for letting me partake in your care today.  Don't hesitate to reach out if you have any additional concerns!  [name], MD  [facility] [place], [place]

## 2022-06-25 NOTE — Telephone Encounter (Signed)
Ok thanks 

## 2022-07-08 ENCOUNTER — Ambulatory Visit: Payer: 59

## 2022-07-09 ENCOUNTER — Ambulatory Visit (INDEPENDENT_AMBULATORY_CARE_PROVIDER_SITE_OTHER): Payer: 59

## 2022-07-09 DIAGNOSIS — L501 Idiopathic urticaria: Secondary | ICD-10-CM | POA: Diagnosis not present

## 2022-08-07 ENCOUNTER — Ambulatory Visit (INDEPENDENT_AMBULATORY_CARE_PROVIDER_SITE_OTHER): Payer: Medicare Other

## 2022-08-07 DIAGNOSIS — L501 Idiopathic urticaria: Secondary | ICD-10-CM | POA: Diagnosis not present

## 2022-09-04 ENCOUNTER — Ambulatory Visit (INDEPENDENT_AMBULATORY_CARE_PROVIDER_SITE_OTHER): Payer: Medicare Other

## 2022-09-04 DIAGNOSIS — L501 Idiopathic urticaria: Secondary | ICD-10-CM | POA: Diagnosis not present

## 2022-09-12 NOTE — Progress Notes (Signed)
67 y.o. G2P1001 Married Caucasian female here for annual exam.    Patient is followed for chronic vulvitis, candida of flexural folds and osteopenia.  Had vulvar/perianal biopsies last year showing lichenoid dermatitis.  Using clobetasol ointment not often.   Vulva is not itching or burning.   Uses Nystatin powder.   Has a allergist due to itchy hives.   PCP:   Kathyrn Lass, MD  No LMP recorded. Patient is postmenopausal.           Sexually active: Yes.    The current method of family planning is post menopausal status.    Exercising: No.     Smoker:  former  Health Maintenance: Pap:  09/19/20 neg: HR HPV neg, 10/27/14 neg History of abnormal Pap: unsure, CIN I on chart review MMG:   09-25-21 Breast Density Category C, BI-RADS CATEGORY 1 Neg Colonoscopy:  ~2017 normal;does every 5 years due to family history colon cancer--mom--Dr. Watt Climes.   States she is up to date.  BMD:   11/13/21  Result  low bone mass spine and forearm.  Done at this office.  TDaP:  PCP Gardasil:   no HIV: no Hep C: no Screening Labs: PCP   reports that she quit smoking about 9 years ago. Her smoking use included cigarettes. She has never used smokeless tobacco. She reports current alcohol use of about 4.0 standard drinks of alcohol per week. She reports that she does not use drugs.  Past Medical History:  Diagnosis Date   Acid reflux    Arthritis    Back pain    Bilateral swelling of feet    CIN I (cervical intraepithelial neoplasia I)    Constipation    Depression    Elevated cholesterol    Hyperlipidemia    Hypertension    IBS (irritable bowel syndrome)    Joint pain    Knee pain    Obesity    Plantar fasciitis    PMDD (premenstrual dysphoric disorder)    Prediabetes    Serum calcium elevated    Normal PTH    Past Surgical History:  Procedure Laterality Date   ANAL FISSURE REPAIR     ANKLE SURGERY     Arm Surg     CERVICAL BIOPSY  W/ LOOP ELECTRODE EXCISION  2006   CIN l   CESAREAN  SECTION     COLPOSCOPY     TUBAL LIGATION      Current Outpatient Medications  Medication Sig Dispense Refill   clobetasol ointment (TEMOVATE) [id number] % Apply 1 Application topically 2 (two) times daily. Use for 2 week twice daily and then use two times a week at bedtime. 60 g 1   cycloSPORINE modified (NEORAL) 100 MG capsule Take 1 capsule (100 mg total) by mouth 2 (two) times daily. 60 capsule 3   EPINEPHrine 0.3 mg/0.3 mL IJ SOAJ injection Inject 0.3 mg into the muscle as needed for anaphylaxis. 1 each 1   fluticasone (FLONASE) 50 MCG/ACT nasal spray Place 1 spray into both nostrils daily. 16 g 2   omeprazole (PRILOSEC) 20 MG capsule 1 capsule 30 minutes before morning meal     pravastatin (PRAVACHOL) 40 MG tablet 1 tablet     telmisartan (MICARDIS) 20 MG tablet Take 0.5 tablets (10 mg total) by mouth daily. 45 tablet 0   venlafaxine (EFFEXOR) 75 MG tablet 1 tablet with food     nystatin (MYCOSTATIN/NYSTOP) powder Apply 1 Application topically 3 (three) times daily. Apply to affected area  for up to 7 days 15 g 3   Current Facility-Administered Medications  Medication Dose Route Frequency Provider Last Rate Last Admin   omalizumab Arvid Right) prefilled syringe 300 mg  300 mg Subcutaneous Q28 days [name], MD   300 mg at 09/04/22 1614    Family History  Problem Relation Age of Onset   Hypertension Mother    Ovarian cancer Mother    Colon cancer Mother    Cancer Mother    Depression Mother    Obesity Mother    Aortic aneurysm Father    Hyperlipidemia Father    Sudden death Father     Review of Systems  All other systems reviewed and are negative.   Exam:   BP 120/78 (BP Location: Right Arm, Patient Position: Sitting, Cuff Size: Large)   Pulse (!) 108   Ht 5' 5"$  (1.651 m)   Wt 278 lb (126.1 kg)   SpO2 93%   BMI 46.26 kg/m     General appearance: alert, cooperative and appears stated age Head: normocephalic, without obvious abnormality, atraumatic Neck: no  adenopathy, supple, symmetrical, trachea midline and thyroid normal to inspection and palpation Lungs: clear to auscultation bilaterally Breasts: normal appearance, no masses or tenderness, No nipple retraction or dimpling, No nipple discharge or bleeding, No axillary adenopathy Heart: regular rate and rhythm Abdomen: soft, non-tender; no masses, no organomegaly.  Erythema of the flexural fold under pannus on left.  Extremities: extremities normal, atraumatic, no cyanosis or edema Skin: skin color, texture, turgor normal. No rashes or lesions Lymph nodes: cervical, supraclavicular, and axillary nodes normal. Neurologic: grossly normal  Pelvic: External genitalia:  circumferential erythema around the vulva.                 No abnormal inguinal nodes palpated.              Urethra:  normal appearing urethra with no masses, tenderness or lesions              Bartholins and Skenes: normal                 Vagina: normal appearing vagina with normal color and discharge, no lesions              Cervix: no lesions              Pap taken: no Bimanual Exam:  Uterus:  normal size, contour, position, consistency, mobility, non-tender              Adnexa: no mass, fullness, tenderness              Rectal exam: yes.  Confirms.              Anus:  normal sphincter tone, Hypopigmentation in the perianal region.   Chaperone was present for exam:  yes  Assessment:   Well woman visit with gynecologic exam. Remote hx CIN I.  Status post LEEP 2006.  CIN I. Chronic vulvitis.  Candida of flexural folds. Encounter for medication monitoring. Osteopenia.  Elevated calcium.  Normal PTH. Depression.  On Effexor. FH colon metastatic to ovary in mother.   Plan: Mammogram screening discussed. Self breast awareness reviewed. Pap and HR HPV 2025.  Guidelines for Calcium, Vitamin D, regular exercise program including cardiovascular and weight bearing exercise. Vaginitis testing.  Rx for Diflucan.  Rx for  Clobetasol.  I recommend using twice a day for 2 weeks and then twice weekly.  Rx for Nystatin powder.  Fu  for recheck 3 weeks.  BMD in 2025.   After visit summary provided.   20 min  total time was spent for this patient encounter, including preparation, face-to-face counseling with the patient, coordination of care, and documentation of the encounter.

## 2022-09-25 ENCOUNTER — Encounter: Payer: Self-pay | Admitting: Obstetrics and Gynecology

## 2022-09-25 ENCOUNTER — Other Ambulatory Visit (HOSPITAL_COMMUNITY)
Admission: RE | Admit: 2022-09-25 | Discharge: 2022-09-25 | Disposition: A | Discharge status: Home / Self Care | Payer: Medicare Other | Source: Ambulatory Visit | Attending: Obstetrics and Gynecology | Admitting: Obstetrics and Gynecology

## 2022-09-25 ENCOUNTER — Ambulatory Visit (INDEPENDENT_AMBULATORY_CARE_PROVIDER_SITE_OTHER): Payer: Medicare Other | Admitting: Internal Medicine

## 2022-09-25 ENCOUNTER — Ambulatory Visit: Payer: Medicare Other | Admitting: Obstetrics and Gynecology

## 2022-09-25 ENCOUNTER — Encounter: Payer: Self-pay | Admitting: Internal Medicine

## 2022-09-25 VITALS — BP 128/78 | HR 85 | Temp 98.0°F | Resp 16

## 2022-09-25 VITALS — BP 120/78 | HR 108 | Ht 65.0 in | Wt 278.0 lb

## 2022-09-25 DIAGNOSIS — E7849 Other hyperlipidemia: Secondary | ICD-10-CM | POA: Diagnosis not present

## 2022-09-25 DIAGNOSIS — B372 Candidiasis of skin and nail: Secondary | ICD-10-CM

## 2022-09-25 DIAGNOSIS — N763 Subacute and chronic vulvitis: Secondary | ICD-10-CM

## 2022-09-25 DIAGNOSIS — J3089 Other allergic rhinitis: Secondary | ICD-10-CM | POA: Diagnosis not present

## 2022-09-25 DIAGNOSIS — M8580 Other specified disorders of bone density and structure, unspecified site: Secondary | ICD-10-CM | POA: Diagnosis not present

## 2022-09-25 DIAGNOSIS — Z5181 Encounter for therapeutic drug level monitoring: Secondary | ICD-10-CM

## 2022-09-25 DIAGNOSIS — Z01419 Encounter for gynecological examination (general) (routine) without abnormal findings: Secondary | ICD-10-CM | POA: Diagnosis not present

## 2022-09-25 DIAGNOSIS — I10 Essential (primary) hypertension: Secondary | ICD-10-CM

## 2022-09-25 DIAGNOSIS — Z79899 Other long term (current) drug therapy: Secondary | ICD-10-CM

## 2022-09-25 DIAGNOSIS — D84821 Immunodeficiency due to drugs: Secondary | ICD-10-CM | POA: Diagnosis not present

## 2022-09-25 DIAGNOSIS — L501 Idiopathic urticaria: Secondary | ICD-10-CM | POA: Diagnosis not present

## 2022-09-25 MED ORDER — NYSTATIN 100000 UNIT/GM EX POWD: 1.0000 | Freq: Three times a day (TID) | CUTANEOUS | 3 refills | Status: DC

## 2022-09-25 MED ORDER — CLOBETASOL PROPIONATE 0.05 % EX OINT: 1.0000 | TOPICAL_OINTMENT | Freq: Two times a day (BID) | CUTANEOUS | 1 refills | Status: DC

## 2022-09-25 MED ORDER — CYCLOSPORINE MODIFIED (NEORAL) 100 MG PO CAPS: 100.0000 mg | ORAL_CAPSULE | Freq: Two times a day (BID) | ORAL | 3 refills | Status: DC

## 2022-09-25 MED ORDER — FLUCONAZOLE 150 MG PO TABS: 150.0000 mg | ORAL_TABLET | Freq: Once | ORAL | 0 refills | Status: AC

## 2022-09-25 NOTE — Progress Notes (Signed)
Follow Up Note  RE: Tiffany Norton MRN: [id number] DOB: [birth year] Date of Office Visit: 09/25/2022  Referring provider: No ref. provider found Primary care provider: Pcp, No  Chief Complaint: Urticaria (/)  History of Present Illness: I had the pleasure of seeing Tiffany Norton for a follow up visit at the [facility] [place] [facility] [place] on 09/25/2022. She is a 67 y.o. female, who is being followed for idiopathic urticaria . Her previous allergy office visit was on 06/24/22 with Dr. Edison Pace. Today is a regular follow up visit.  History obtained from patient, chart review.  Urticaria is not well controlled on xolair.  No side effects.  She still has daily flares.  Using Zyrtec 20 mg twice a day.  She is open to moving on to cyclosporine at this time.  Discussed risks and benefits including need for monthly blood pressure checks, BMP and yearly lipid panel.  She does have high blood pressure on medications and hyperlipidemia on pravastatin.  She is okay with proceeding.  Her allergic rhinitis is well-controlled.  Usually flares in the fall.  Rare nasal congestion which responds to Flonase.  Needing 1-2 times per month.  Assessment and Plan: Tiffany Norton is a 67 y.o. female with: Idiopathic urticaria  Immunosuppression due to drug therapy ([place]) - Plan: Lipid Panel w/o Chol/HDL Ratio, Basic Metabolic Panel (BMET)  Other allergic rhinitis  Other hyperlipidemia  Essential hypertension   Plan: Patient Instructions  Urticaria -not well controlled  - Continue Zyrtec 20mg  twice a day  - Stop xolair - Start cyclosporine 100mg  twice daily  - Will get baseline lipids, BMP today.   - due to drug drug interaction, I am decreasing your dose of pravastatin to 20mg  daily.   -please follow up with primary care.    Allergic rhinitis:  - Previous testing positive to grass, weeds-2023 - Continue Avoidance measures - Continue with:  Zyrtec 10mg  daily and Flonase (fluticasone) one spray per  nostril daily - You can use an extra dose of the antihistamine, if needed, for breakthrough symptoms.   Follow up: 4 weeks   Thank you so much for letting me partake in your care today.  Don't hesitate to reach out if you have any additional concerns!  [name], MD  [facility] [place]   Meds ordered this encounter  Medications   cycloSPORINE modified (NEORAL) 100 MG capsule    Sig: Take 1 capsule (100 mg total) by mouth 2 (two) times daily.    Dispense:  60 capsule    Refill:  3    Lab Orders         Lipid Panel w/o Chol/HDL Ratio         Basic Metabolic Panel (BMET)     Diagnostics: None done   Medication List:  Current Outpatient Medications  Medication Sig Dispense Refill   cycloSPORINE modified (NEORAL) 100 MG capsule Take 1 capsule (100 mg total) by mouth 2 (two) times daily. 60 capsule 3   EPINEPHrine 0.3 mg/0.3 mL IJ SOAJ injection Inject 0.3 mg into the muscle as needed for anaphylaxis. 1 each 1   fluticasone (FLONASE) 50 MCG/ACT nasal spray Place 1 spray into both nostrils daily. 16 g 2   nystatin (MYCOSTATIN/NYSTOP) powder Apply 1 application topically 3 (three) times daily. Apply to affected area for up to 7 days 15 g 3   omeprazole (PRILOSEC) 20 MG capsule 1 capsule 30 minutes before morning meal     pravastatin (PRAVACHOL)  40 MG tablet 1 tablet     telmisartan (MICARDIS) 20 MG tablet Take 0.5 tablets (10 mg total) by mouth daily. 45 tablet 0   venlafaxine (EFFEXOR) 75 MG tablet 1 tablet with food     Current Facility-Administered Medications  Medication Dose Route Frequency Provider Last Rate Last Admin   omalizumab Arvid Right) prefilled syringe 300 mg  300 mg Subcutaneous Q28 days [name], MD   300 mg at 09/04/22 1614   Allergies: Allergies  Allergen Reactions   Temovate [Clobetasol] Hives   Aspirin Hives and Other (See Comments)   Etodolac Hives and Other (See Comments)   Nsaids Other (See Comments)   I reviewed her  past medical history, social history, family history, and environmental history and no significant changes have been reported from her previous visit.  ROS: All others negative except as noted per HPI.   Objective: BP 128/78   Pulse 85   Temp 98 F (36.7 C) (Temporal)   Resp 16   SpO2 97%  There is no height or weight on file to calculate BMI. General Appearance:  Alert, cooperative, no distress, appears stated age  Head:  Normocephalic, without obvious abnormality, atraumatic  Eyes:  Conjunctiva clear, EOM's intact  Nose: Nares normal,   Throat: Lips, tongue normal; teeth and gums normal,   Neck: Supple, symmetrical  Lungs:   , Respirations unlabored, no coughing  Heart:  , Appears well perfused  Extremities: No edema  Skin: Skin color, texture, turgor normal, no rashes or lesions on visualized portions of skin  Neurologic: No gross deficits   Previous notes and tests were reviewed. The plan was reviewed with the patient/family, and all questions/concerned were addressed.  It was my pleasure to see Tiffany Norton today and participate in her care. Please feel free to contact me with any questions or concerns.  Sincerely,  [name], MD  Allergy & Immunology  [facility] [place] [facility] Office: [phone]

## 2022-09-25 NOTE — Patient Instructions (Addendum)
Urticaria -not well controlled  - Continue Zyrtec 20mg  twice a day  - Stop xolair - Start cyclosporine 100mg  twice daily  - Will get baseline lipids, BMP today.   - due to drug drug interaction, I am decreasing your dose of pravastatin to 20mg  daily.   -please follow up with primary care.    Allergic rhinitis:  - Previous testing positive to grass, weeds-2023 - Continue Avoidance measures - Continue with:  Zyrtec 10mg  daily and Flonase (fluticasone) one spray per nostril daily - You can use an extra dose of the antihistamine, if needed, for breakthrough symptoms.   Follow up: 4 weeks   Thank you so much for letting me partake in your care today.  Don't hesitate to reach out if you have any additional concerns!  [name], MD  [facility] [place], [place]

## 2022-09-26 LAB — CERVICOVAGINAL ANCILLARY ONLY
Bacterial Vaginitis (gardnerella): NEGATIVE
Candida Glabrata: NEGATIVE
Candida Vaginitis: NEGATIVE
Comment: NEGATIVE
Comment: NEGATIVE
Comment: NEGATIVE
Comment: NEGATIVE
Trichomonas: NEGATIVE

## 2022-09-26 LAB — LIPID PANEL W/O CHOL/HDL RATIO
Cholesterol, Total: 165 mg/dL (ref 100–199)
HDL: 44 mg/dL (ref >39)
LDL Chol Calc (NIH): 108 mg/dL — ABNORMAL HIGH (ref 0–99)
Triglycerides: 68 mg/dL (ref 0–149)
VLDL Cholesterol Cal: 13 mg/dL (ref 5–40)

## 2022-09-26 LAB — BASIC METABOLIC PANEL
BUN/Creatinine Ratio: 18 (ref 12–28)
BUN: 18 mg/dL (ref 8–27)
CO2: 22 mmol/L (ref 20–29)
Calcium: 10.3 mg/dL (ref 8.7–10.3)
Chloride: 101 mmol/L (ref 96–106)
Creatinine, Ser: 1 mg/dL (ref 0.57–1.00)
Glucose: 93 mg/dL (ref 70–99)
Potassium: 4.7 mmol/L (ref 3.5–5.2)
Sodium: 138 mmol/L (ref 134–144)
eGFR: 63 mL/min/{1.73_m2} (ref >59)

## 2022-09-27 NOTE — Progress Notes (Signed)
Lipids and kidney function looked okay.  She should continue cyclosporine as planned.  Thanks!

## 2022-09-29 NOTE — Patient Instructions (Signed)

## 2022-09-30 ENCOUNTER — Ambulatory Visit: Payer: Medicare Other

## 2022-10-02 NOTE — Progress Notes (Unsigned)
GYNECOLOGY  VISIT   HPI: 67 y.o.   Married  Caucasian  female   G1P1001 with No LMP recorded. Patient is postmenopausal.   here for   3 week recheck.  Patient treated for yeast vulvitis and given refill of clobetasol for vulvitis at visit on 09/25/22.  She was also given rx for nystatin powder.   The clobetasol was causing burning and itching, so I recommended she stop this and then switch to Mycolog II twice daily for one week.   Her vulva is sometimes uncomfortable.   Bx lichenoid dermatitis.  Uses Dove soap.   Uses fabric softener.  She has a problem with hives.  Now she switched her laundry detergent.   She was doing allergy shots and has stopped.  She is now taking Cyclosporine for her hives.   Some urinary incontinence. Urgency and not stress incontinence.  Not wearing pads.  Likes caffeine.   No glaucoma or irregular heart beat.  GYNECOLOGIC HISTORY: No LMP recorded. Patient is postmenopausal. Contraception:  PMP Menopausal hormone therapy:  n/a Last mammogram:  09-25-22 Breast Density Category C, BI-RADS CATEGORY 1 Neg  Last pap smear:   09/19/20 neg: HR HPV neg, 10/27/14 neg         OB History     Gravida  1   Para  1   Term  1   Preterm      AB      Living  1      SAB      IAB      Ectopic      Multiple      Live Births                 Patient Active Problem List   Diagnosis Date Noted   Other allergic rhinitis 01/02/2022   Chronic idiopathic urticaria 01/02/2022   Vitamin D deficiency 02/16/2020   Prediabetes 02/16/2020   B12 deficiency 02/16/2020   Class 3 severe obesity with serious comorbidity and body mass index (BMI) of 40.0 to 44.9 in adult [facility]) 02/16/2020   Essential hypertension 04/18/2016   Other hyperlipidemia    CIN I (cervical intraepithelial neoplasia I)    PMDD (premenstrual dysphoric disorder)    IBS (irritable bowel syndrome)     Past Medical History:  Diagnosis Date   Acid reflux    Arthritis    Back pain     Bilateral swelling of feet    CIN I (cervical intraepithelial neoplasia I)    Constipation    Depression    Elevated cholesterol    Hyperlipidemia    Hypertension    IBS (irritable bowel syndrome)    Joint pain    Knee pain    Obesity    Plantar fasciitis    PMDD (premenstrual dysphoric disorder)    Prediabetes    Serum calcium elevated    Normal PTH    Past Surgical History:  Procedure Laterality Date   ANAL FISSURE REPAIR     ANKLE SURGERY     Arm Surg     CERVICAL BIOPSY  W/ LOOP ELECTRODE EXCISION  2006   CIN l   CESAREAN SECTION     COLPOSCOPY     TUBAL LIGATION      Current Outpatient Medications  Medication Sig Dispense Refill   clobetasol ointment (TEMOVATE) [id number] % Apply 1 Application topically 2 (two) times daily. Use for 2 week twice daily and then use two times a week at bedtime. [place]  g 1   cycloSPORINE modified (NEORAL) 100 MG capsule Take 1 capsule (100 mg total) by mouth 2 (two) times daily. 60 capsule 3   EPINEPHrine 0.3 mg/0.3 mL IJ SOAJ injection Inject 0.3 mg into the muscle as needed for anaphylaxis. 1 each 1   fluticasone (FLONASE) 50 MCG/ACT nasal spray Place 1 spray into both nostrils daily. 16 g 2   nystatin (MYCOSTATIN/NYSTOP) powder Apply 1 Application topically 3 (three) times daily. Apply to affected area for up to 7 days 15 g 3   nystatin-triamcinolone ointment (MYCOLOG) Apply twice daily to the vulva for one week. 30 g 0   omeprazole (PRILOSEC) 20 MG capsule 1 capsule 30 minutes before morning meal     pravastatin (PRAVACHOL) 40 MG tablet 1 tablet     telmisartan (MICARDIS) 20 MG tablet Take 0.5 tablets (10 mg total) by mouth daily. 45 tablet 0   venlafaxine (EFFEXOR) 75 MG tablet 1 tablet with food     Current Facility-Administered Medications  Medication Dose Route Frequency Provider Last Rate Last Admin   omalizumab Arvid Right) prefilled syringe 300 mg  300 mg Subcutaneous Q28 days [name], MD   300 mg at 09/04/22 1614      ALLERGIES: Aspirin, Etodolac, and Nsaids  Family History  Problem Relation Age of Onset   Hypertension Mother    Ovarian cancer Mother    Colon cancer Mother    Cancer Mother    Depression Mother    Obesity Mother    Aortic aneurysm Father    Hyperlipidemia Father    Sudden death Father     Social History   Socioeconomic History   Marital status: Married    Spouse name: Not on file   Number of children: Not on file   Years of education: Not on file   Highest education level: Not on file  Occupational History   Not on file  Tobacco Use   Smoking status: Former    Types: Cigarettes    Quit date: 2015    Years since quitting: 9.1   Smokeless tobacco: Never  Vaping Use   Vaping Use: Never used  Substance and Sexual Activity   Alcohol use: Yes    Alcohol/week: 4.0 standard drinks of alcohol    Types: 4 Glasses of wine per week   Drug use: No   Sexual activity: Yes    Birth control/protection: Surgical    Comment: intercourse early 20's, less than 5 sexual partners  Other Topics Concern   Not on file  Social History Narrative   Not on file   Social Determinants of Health   Financial Resource Strain: Not on file  Food Insecurity: Not on file  Transportation Needs: Not on file  Physical Activity: Not on file  Stress: Not on file  Social Connections: Not on file  Intimate Partner Violence: Not on file    Review of Systems  All other systems reviewed and are negative.   PHYSICAL EXAMINATION:    BP 120/86 (BP Location: Right Arm, Patient Position: Sitting, Cuff Size: Large)   Pulse 90   Ht '5\' 5"'$  (1.651 m)   Wt 278 lb (126.1 kg)   SpO2 96%   BMI 46.26 kg/m     General appearance: alert, cooperative and appears stated age Head: Normocephalic, without obvious abnormality, atraumatic Neck: no adenopathy, supple, symmetrical, trachea midline and thyroid normal to inspection and palpation Lungs: clear to auscultation bilaterally Breasts: normal appearance,  no masses or tenderness, No nipple retraction  or dimpling, No nipple discharge or bleeding, No axillary or supraclavicular adenopathy Heart: regular rate and rhythm Abdomen: soft, non-tender, no masses,  no organomegaly Extremities: extremities normal, atraumatic, no cyanosis or edema Skin: Skin color, texture, turgor normal. No rashes or lesions Lymph nodes: Cervical, supraclavicular, and axillary nodes normal. No abnormal inguinal nodes palpated Neurologic: Grossly normal  Pelvic: External genitalia:  erythema  around the anal opening and superior labia minora with hypopigmentation.               Urethra:  normal appearing urethra with no masses, tenderness or lesions              Bartholins and Skenes: normal                 Vagina: normal appearing vagina with normal color and discharge, no lesions              Cervix: no lesions                Bimanual Exam:  Uterus:  normal size, contour, position, consistency, mobility, non-tender              Adnexa: no mass, fullness, tenderness              Rectal exam: {yes no:[id number]}.  Confirms.              Anus:  normal sphincter tone, no lesions  Chaperone was present for exam:  ***  ASSESSMENT  Chronic vulvitis.  Urinary urgency.  PLAN  Use poise pads.  Stop steroid ointments.  Zinc oxide.  Nystatin powder.  Dreft.  Fu in one month.    An After Visit Summary was printed and given to the patient.  ______ minutes face to face time of which over 50% was spent in counseling.

## 2022-10-04 ENCOUNTER — Encounter: Payer: Self-pay | Admitting: Internal Medicine

## 2022-10-04 ENCOUNTER — Encounter: Payer: Self-pay | Admitting: Obstetrics and Gynecology

## 2022-10-04 NOTE — Telephone Encounter (Signed)
She can continue to take the pravastatin for now.  If she develops any muscle aches we can decrease it

## 2022-10-06 NOTE — Telephone Encounter (Signed)
I recommend that the patient stop the clobetasol ointment for now.   I recommend instead that she try Mycolog II ointment to the vulva twice a day for one week.  (This medication has a combination of nystatin for yeast and triamcinolone, which is a less potent steroid for inflammation.)  I will see her back for her recheck appointment on 10/16/22.

## 2022-10-07 MED ORDER — NYSTATIN-TRIAMCINOLONE 100000-0.1 UNIT/GM-% EX OINT: TOPICAL_OINTMENT | CUTANEOUS | 0 refills | Status: DC

## 2022-10-07 NOTE — Telephone Encounter (Signed)
Tiffany Norton,  it was listed as active on her med list in the computer.  If she isn't on it there is nothing to do.  We will continue to monitor her cholesterol annually for the cyclosporine use and if she needs treatment in the future we can have her discuss with her PCP.  Thanks!

## 2022-10-16 ENCOUNTER — Encounter: Payer: Self-pay | Admitting: Obstetrics and Gynecology

## 2022-10-16 ENCOUNTER — Ambulatory Visit (INDEPENDENT_AMBULATORY_CARE_PROVIDER_SITE_OTHER): Payer: Medicare Other | Admitting: Obstetrics and Gynecology

## 2022-10-16 VITALS — BP 120/86 | HR 90 | Ht 65.0 in | Wt 278.0 lb

## 2022-10-16 DIAGNOSIS — R3915 Urgency of urination: Secondary | ICD-10-CM

## 2022-10-16 DIAGNOSIS — N763 Subacute and chronic vulvitis: Secondary | ICD-10-CM

## 2022-10-16 NOTE — Patient Instructions (Signed)

## 2022-10-23 ENCOUNTER — Ambulatory Visit (INDEPENDENT_AMBULATORY_CARE_PROVIDER_SITE_OTHER): Payer: Medicare Other | Admitting: Internal Medicine

## 2022-10-23 ENCOUNTER — Encounter: Payer: Self-pay | Admitting: Internal Medicine

## 2022-10-23 VITALS — BP 130/68 | HR 93 | Temp 97.9°F | Resp 16

## 2022-10-23 DIAGNOSIS — D84821 Immunodeficiency due to drugs: Secondary | ICD-10-CM

## 2022-10-23 DIAGNOSIS — Z79899 Other long term (current) drug therapy: Secondary | ICD-10-CM | POA: Diagnosis not present

## 2022-10-23 DIAGNOSIS — L501 Idiopathic urticaria: Secondary | ICD-10-CM

## 2022-10-23 DIAGNOSIS — J3089 Other allergic rhinitis: Secondary | ICD-10-CM | POA: Diagnosis not present

## 2022-10-23 NOTE — Progress Notes (Signed)
Follow Up Note  RE: Tiffany Norton MRN: [id number] DOB: [birth year] Date of Office Visit: 10/23/2022  Referring provider: No ref. provider found Primary care provider: Kathyrn Lass, MD  Chief Complaint: Urticaria  History of Present Illness: I had the pleasure of seeing Tiffany Norton for a follow up visit at the [facility] [place] [facility] [place] on 10/23/2022. She is a 67 y.o. female, who is being followed for idiopathic urticaria . Her previous allergy office visit was on 09/25/22 with Dr. Edison Pace. Today is a regular follow up visit.  History obtained from patient, chart review.  At last visit her urticaria was not well-controlled on Xolair and Zyrtec 20 mg twice daily.  She was started on cyclosporine 100 mg twice daily.  Baseline lipid and BMP was ordered and was normal.  Today she reports for follow-up  Today she reports  She has a slight increase in headaches and upset stomach, but urticaria has  Fully resolved.  She has missed her nightime '20mg'$  zyrtec dosing, but has no breakthrough symptoms .  She is comfortable with continuing cyclosporine  Her allergic rhinitis is moderately well-controlled.   Rare nasal congestion which responds to Flonase, but she forgets to take it.  Using 1-2 times per month.  Assessment and Plan: Skylinn is a 67 y.o. female with: Idiopathic urticaria - Plan: Basic Metabolic Panel (BMET)  Other allergic rhinitis  Immunosuppression due to drug therapy ([place])   Plan: Patient Instructions  Urticaria - well controlled  - Decrease Zyrtec '10mg'$  twice a day  -Continue cyclosporine '100mg'$  twice daily  - BMP ordered today - Blood pressure okay today  Allergic rhinitis:  - Previous testing positive to grass, weeds-2023 - Continue Avoidance measures - Continue with:  Zyrtec '10mg'$  twice daily  and Flonase (fluticasone) one spray per nostril daily - You can use an extra dose of the antihistamine, if needed, for breakthrough symptoms.   Follow up: 4  weeks   Thank you so much for letting me partake in your care today.  Don't hesitate to reach out if you have any additional concerns!  [name], MD  [facility] [place] [place]   No orders of the defined types were placed in this encounter.   Lab Orders         Basic Metabolic Panel (BMET)      Diagnostics: None done   Medication List:  Current Outpatient Medications  Medication Sig Dispense Refill   clobetasol ointment (TEMOVATE) [id number] % Apply 1 Application topically 2 (two) times daily. Use for 2 week twice daily and then use two times a week at bedtime. 60 g 1   cycloSPORINE modified (NEORAL) 100 MG capsule Take 1 capsule (100 mg total) by mouth 2 (two) times daily. 60 capsule 3   EPINEPHrine 0.3 mg/0.3 mL IJ SOAJ injection Inject 0.3 mg into the muscle as needed for anaphylaxis. 1 each 1   fluticasone (FLONASE) 50 MCG/ACT nasal spray Place 1 spray into both nostrils daily. 16 g 2   nystatin (MYCOSTATIN/NYSTOP) powder Apply 1 Application topically 3 (three) times daily. Apply to affected area for up to 7 days 15 g 3   nystatin-triamcinolone ointment (MYCOLOG) Apply twice daily to the vulva for one week. 30 g 0   omeprazole (PRILOSEC) 20 MG capsule 1 capsule 30 minutes before morning meal     pravastatin (PRAVACHOL) 40 MG tablet 1 tablet     telmisartan (MICARDIS) 20 MG tablet Take 0.5 tablets (10 mg total)  by mouth daily. 45 tablet 0   venlafaxine (EFFEXOR) 75 MG tablet 1 tablet with food     Current Facility-Administered Medications  Medication Dose Route Frequency Provider Last Rate Last Admin   omalizumab Arvid Right) prefilled syringe 300 mg  300 mg Subcutaneous Q28 days [name], MD   300 mg at 09/04/22 1614   Allergies: Allergies  Allergen Reactions   Aspirin Hives and Other (See Comments)   Etodolac Hives and Other (See Comments)   Nsaids Other (See Comments)   I reviewed her past medical history, social history, family history, and  environmental history and no significant changes have been reported from her previous visit.  ROS: All others negative except as noted per HPI.   Objective: BP 130/68   Pulse 93   Temp 97.9 F (36.6 C) (Temporal)   Resp 16   SpO2 94%  There is no height or weight on file to calculate BMI. General Appearance:  Alert, cooperative, no distress, appears stated age  Head:  Normocephalic, without obvious abnormality, atraumatic  Eyes:  Conjunctiva clear, EOM's intact  Nose: Nares normal, erythematous nasal mucosa, slightly inflamed turbinates on the right  Throat: Lips, tongue normal; teeth and gums normal,   Neck: Supple, symmetrical  Lungs:   , Respirations unlabored, no coughing  Heart:  , Appears well perfused  Extremities: No edema  Skin: Skin color, texture, turgor normal, no rashes or lesions on visualized portions of skin  Neurologic: No gross deficits   Previous notes and tests were reviewed. The plan was reviewed with the patient/family, and all questions/concerned were addressed.  It was my pleasure to see Tiffany Norton today and participate in her care. Please feel free to contact me with any questions or concerns.  Sincerely,  [name], MD  Allergy & Immunology  [facility] [place] [facility] Office: [phone]

## 2022-10-23 NOTE — Patient Instructions (Addendum)
Urticaria - well controlled  - Decrease Zyrtec '10mg'$  twice a day  -Continue cyclosporine '100mg'$  twice daily  - BMP ordered today - Blood pressure okay today  Allergic rhinitis:  - Previous testing positive to grass, weeds-2023 - Continue Avoidance measures - Continue with:  Zyrtec '10mg'$  twice daily  and Flonase (fluticasone) one spray per nostril daily - You can use an extra dose of the antihistamine, if needed, for breakthrough symptoms.   Follow up: 4 weeks   Thank you so much for letting me partake in your care today.  Don't hesitate to reach out if you have any additional concerns!  [name], MD  [facility] [place], [place]

## 2022-10-24 ENCOUNTER — Encounter: Payer: Self-pay | Admitting: Internal Medicine

## 2022-10-24 LAB — BASIC METABOLIC PANEL
BUN/Creatinine Ratio: 24 (ref 12–28)
BUN: 23 mg/dL (ref 8–27)
CO2: 23 mmol/L (ref 20–29)
Calcium: 10.6 mg/dL — ABNORMAL HIGH (ref 8.7–10.3)
Chloride: 103 mmol/L (ref 96–106)
Creatinine, Ser: 0.95 mg/dL (ref 0.57–1.00)
Glucose: 93 mg/dL (ref 70–99)
Potassium: 4.8 mmol/L (ref 3.5–5.2)
Sodium: 140 mmol/L (ref 134–144)
eGFR: 66 mL/min/{1.73_m2} (ref >59)

## 2022-10-25 NOTE — Telephone Encounter (Signed)
Calcium is slightly high.  This would not be due to the cyclosporine, but she should follow up with PCP

## 2022-11-06 NOTE — Progress Notes (Deleted)
GYNECOLOGY  VISIT   HPI: 67 y.o.   Married  Caucasian  female   G1P1001 with No LMP recorded. Patient is postmenopausal.   here for   1 mo f/u  GYNECOLOGIC HISTORY: No LMP recorded. Patient is postmenopausal. Contraception:  PMP Menopausal hormone therapy:  n/a Last mammogram:  09/25/22 Breast Density Category C, BI-RADS CATEGORY 1 Neg Last pap smear:   09/19/20 neg: HR HPV neg, 10/27/14 neg        OB History     Gravida  1   Para  1   Term  1   Preterm      AB      Living  1      SAB      IAB      Ectopic      Multiple      Live Births                 Patient Active Problem List   Diagnosis Date Noted   Other allergic rhinitis 01/02/2022   Chronic idiopathic urticaria 01/02/2022   Vitamin D deficiency 02/16/2020   Prediabetes 02/16/2020   B12 deficiency 02/16/2020   Class 3 severe obesity with serious comorbidity and body mass index (BMI) of 40.0 to 44.9 in adult [facility]) 02/16/2020   Essential hypertension 04/18/2016   Other hyperlipidemia    CIN I (cervical intraepithelial neoplasia I)    PMDD (premenstrual dysphoric disorder)    IBS (irritable bowel syndrome)     Past Medical History:  Diagnosis Date   Acid reflux    Arthritis    Back pain    Bilateral swelling of feet    CIN I (cervical intraepithelial neoplasia I)    Constipation    Depression    Elevated cholesterol    Hyperlipidemia    Hypertension    IBS (irritable bowel syndrome)    Joint pain    Knee pain    Obesity    Plantar fasciitis    PMDD (premenstrual dysphoric disorder)    Prediabetes    Serum calcium elevated    Normal PTH    Past Surgical History:  Procedure Laterality Date   ANAL FISSURE REPAIR     ANKLE SURGERY     Arm Surg     CERVICAL BIOPSY  W/ LOOP ELECTRODE EXCISION  2006   CIN l   CESAREAN SECTION     COLPOSCOPY     TUBAL LIGATION      Current Outpatient Medications  Medication Sig Dispense Refill   clobetasol ointment (TEMOVATE) [id number] % Apply 1  Application topically 2 (two) times daily. Use for 2 week twice daily and then use two times a week at bedtime. 60 g 1   cycloSPORINE modified (NEORAL) 100 MG capsule Take 1 capsule (100 mg total) by mouth 2 (two) times daily. 60 capsule 3   EPINEPHrine 0.3 mg/0.3 mL IJ SOAJ injection Inject 0.3 mg into the muscle as needed for anaphylaxis. 1 each 1   fluticasone (FLONASE) 50 MCG/ACT nasal spray Place 1 spray into both nostrils daily. 16 g 2   nystatin (MYCOSTATIN/NYSTOP) powder Apply 1 Application topically 3 (three) times daily. Apply to affected area for up to 7 days 15 g 3   nystatin-triamcinolone ointment (MYCOLOG) Apply twice daily to the vulva for one week. 30 g 0   omeprazole (PRILOSEC) 20 MG capsule 1 capsule 30 minutes before morning meal     pravastatin (PRAVACHOL) 40 MG tablet 1 tablet  telmisartan (MICARDIS) 20 MG tablet Take 0.5 tablets (10 mg total) by mouth daily. 45 tablet 0   venlafaxine (EFFEXOR) 75 MG tablet 1 tablet with food     Current Facility-Administered Medications  Medication Dose Route Frequency Provider Last Rate Last Admin   omalizumab Arvid Right) prefilled syringe 300 mg  300 mg Subcutaneous Q28 days [name], MD   300 mg at 09/04/22 1614     ALLERGIES: Aspirin, Etodolac, and Nsaids  Family History  Problem Relation Age of Onset   Hypertension Mother    Ovarian cancer Mother    Colon cancer Mother    Cancer Mother    Depression Mother    Obesity Mother    Aortic aneurysm Father    Hyperlipidemia Father    Sudden death Father     Social History   Socioeconomic History   Marital status: Married    Spouse name: Not on file   Number of children: Not on file   Years of education: Not on file   Highest education level: Not on file  Occupational History   Not on file  Tobacco Use   Smoking status: Former    Types: Cigarettes    Quit date: 2015    Years since quitting: 9.2   Smokeless tobacco: Never  Vaping Use   Vaping Use: Never used   Substance and Sexual Activity   Alcohol use: Yes    Alcohol/week: 4.0 standard drinks of alcohol    Types: 4 Glasses of wine per week   Drug use: No   Sexual activity: Yes    Birth control/protection: Surgical    Comment: intercourse early 20's, less than 5 sexual partners  Other Topics Concern   Not on file  Social History Narrative   Not on file   Social Determinants of Health   Financial Resource Strain: Not on file  Food Insecurity: Not on file  Transportation Needs: Not on file  Physical Activity: Not on file  Stress: Not on file  Social Connections: Not on file  Intimate Partner Violence: Not on file    Review of Systems  PHYSICAL EXAMINATION:    There were no vitals taken for this visit.    General appearance: alert, cooperative and appears stated age Head: Normocephalic, without obvious abnormality, atraumatic Neck: no adenopathy, supple, symmetrical, trachea midline and thyroid normal to inspection and palpation Lungs: clear to auscultation bilaterally Breasts: normal appearance, no masses or tenderness, No nipple retraction or dimpling, No nipple discharge or bleeding, No axillary or supraclavicular adenopathy Heart: regular rate and rhythm Abdomen: soft, non-tender, no masses,  no organomegaly Extremities: extremities normal, atraumatic, no cyanosis or edema Skin: Skin color, texture, turgor normal. No rashes or lesions Lymph nodes: Cervical, supraclavicular, and axillary nodes normal. No abnormal inguinal nodes palpated Neurologic: Grossly normal  Pelvic: External genitalia:  no lesions              Urethra:  normal appearing urethra with no masses, tenderness or lesions              Bartholins and Skenes: normal                 Vagina: normal appearing vagina with normal color and discharge, no lesions              Cervix: no lesions                Bimanual Exam:  Uterus:  normal size, contour, position, consistency, mobility, non-tender  Adnexa: no mass, fullness, tenderness              Rectal exam: {yes no:314532}.  Confirms.              Anus:  normal sphincter tone, no lesions  Chaperone was present for exam:  ***  ASSESSMENT     PLAN     An After Visit Summary was printed and given to the patient.  ______ minutes face to face time of which over 50% was spent in counseling.

## 2022-11-20 ENCOUNTER — Ambulatory Visit: Payer: Medicare Other | Admitting: Obstetrics and Gynecology

## 2022-11-26 ENCOUNTER — Ambulatory Visit (INDEPENDENT_AMBULATORY_CARE_PROVIDER_SITE_OTHER): Payer: Medicare Other | Admitting: Internal Medicine

## 2022-11-26 ENCOUNTER — Other Ambulatory Visit: Payer: Self-pay

## 2022-11-26 VITALS — BP 128/82 | HR 108 | Temp 98.7°F | Resp 20 | Wt 275.3 lb

## 2022-11-26 DIAGNOSIS — D84821 Immunodeficiency due to drugs: Secondary | ICD-10-CM | POA: Diagnosis not present

## 2022-11-26 DIAGNOSIS — L501 Idiopathic urticaria: Secondary | ICD-10-CM

## 2022-11-26 DIAGNOSIS — Z79899 Other long term (current) drug therapy: Secondary | ICD-10-CM

## 2022-11-26 DIAGNOSIS — H1045 Other chronic allergic conjunctivitis: Secondary | ICD-10-CM

## 2022-11-26 DIAGNOSIS — J3089 Other allergic rhinitis: Secondary | ICD-10-CM | POA: Diagnosis not present

## 2022-11-26 DIAGNOSIS — I10 Essential (primary) hypertension: Secondary | ICD-10-CM | POA: Diagnosis not present

## 2022-11-26 NOTE — Progress Notes (Signed)
Follow Up Note  RE: Tiffany Norton MRN: [id number] DOB: 09/22/1955 Date of Office Visit: 11/26/2022  Referring provider: Sigmund Hazel, MD Primary care provider: Sigmund Hazel, MD  Chief Complaint: Follow-up  History of Present Illness: I had the pleasure of seeing Tiffany Norton for a follow up visit at the [facility] on 11/26/2022. She is a 67 y.o. female, who is being followed for idiopathic urticaria . Her previous allergy office visit was on 10/23/22 with Dr. Marlynn Perking. Today is a regular follow up visit.  History obtained from patient, chart review.  Today she reports  She continues to have headaches and mild GI upset. Urticaria is completely controlled however and she has no breakthrough urticaria or itch She is taking her cyclosporine 100 mg twice a day and Zyrtec 10 mg in the morning  For rhinitis she has not mild sneezing, rhinorrhea which she attributes to high pollen counts.  She does have Flonase that she uses as needed a few times a week for nasal symptoms. Assessment and Plan: Tiffany Norton is a 67 y.o. female with: Idiopathic urticaria  Other allergic rhinitis  Immunosuppression due to drug therapy - Plan: Basic Metabolic Panel (BMET)  Essential hypertension  Other chronic allergic conjunctivitis of both eyes   Plan: Patient Instructions  Urticaria - well controlled  - Continue Zyrtec 10mg  once  a day  - Try decreasing cyclosporine 100mg  to once a day and see if headaches and GI upset improve  - BMP ordered today - Blood pressure okay today  Allergic rhinitis:  - Previous testing positive to grass, weeds-2023 - Continue Avoidance measures - Continue with:  Zyrtec 10mg  once daily  and Flonase (fluticasone) one spray per nostril daily - You can use an extra dose of the antihistamine, if needed, for breakthrough symptoms.   Follow up: 4 weeks   Thank you so much for letting me partake in your care today.  Don't hesitate to reach out if  you have any additional concerns!  Ferol Luz, MD  [facility] [place] [place]   No orders of the defined types were placed in this encounter.   Lab Orders         Basic Metabolic Panel (BMET)       Diagnostics: None done   Medication List:  Current Outpatient Medications  Medication Sig Dispense Refill   clobetasol ointment (TEMOVATE) 0.05 % Apply 1 Application topically 2 (two) times daily. Use for 2 week twice daily and then use two times a week at bedtime. 60 g 1   cycloSPORINE modified (NEORAL) 100 MG capsule Take 1 capsule (100 mg total) by mouth 2 (two) times daily. 60 capsule 3   EPINEPHrine 0.3 mg/0.3 mL IJ SOAJ injection Inject 0.3 mg into the muscle as needed for anaphylaxis. 1 each 1   fluticasone (FLONASE) 50 MCG/ACT nasal spray Place 1 spray into both nostrils daily. 16 g 2   nystatin (MYCOSTATIN/NYSTOP) powder Apply 1 Application topically 3 (three) times daily. Apply to affected area for up to 7 days 15 g 3   nystatin-triamcinolone ointment (MYCOLOG) Apply twice daily to the vulva for one week. 30 g 0   omeprazole (PRILOSEC) 20 MG capsule 1 capsule 30 minutes before morning meal     pravastatin (PRAVACHOL) 40 MG tablet 1 tablet     telmisartan (MICARDIS) 20 MG tablet Take 0.5 tablets (10 mg total) by mouth daily. 45 tablet 0   venlafaxine (EFFEXOR) 75 MG tablet 1  tablet with food     Current Facility-Administered Medications  Medication Dose Route Frequency Provider Last Rate Last Admin   omalizumab Geoffry Paradise) prefilled syringe 300 mg  300 mg Subcutaneous Q28 days Ferol Luz, MD   300 mg at 09/04/22 1614   Allergies: Allergies  Allergen Reactions   Aspirin Hives and Other (See Comments)   Etodolac Hives and Other (See Comments)   Nsaids Other (See Comments)   I reviewed her past medical history, social history, family history, and environmental history and no significant changes have been reported from her previous visit.  ROS: All  others negative except as noted per HPI.   Objective: BP 128/82 (BP Location: Left Arm, Patient Position: Sitting, Cuff Size: Large)   Pulse (!) 108   Temp 98.7 F (37.1 C) (Temporal)   Resp 20   Wt 275 lb 4.8 oz (124.9 kg)   SpO2 98%   BMI 45.81 kg/m  Body mass index is 45.81 kg/m. General Appearance:  Alert, cooperative, no distress, appears stated age  Head:  Normocephalic, without obvious abnormality, atraumatic  Eyes:  Conjunctiva clear, EOM's intact  Nose: Nares normal, erythematous nasal mucosa, no anterior nasal polyps, septum midline  Throat: Lips, tongue normal; teeth and gums normal,   Neck: Supple, symmetrical  Lungs:   , Respirations unlabored, no coughing  Heart:  , Appears well perfused  Extremities: No edema  Skin: Skin color, texture, turgor normal, no rashes or lesions on visualized portions of skin  Neurologic: No gross deficits   Previous notes and tests were reviewed. The plan was reviewed with the patient/family, and all questions/concerned were addressed.  It was my pleasure to see Matia today and participate in her care. Please feel free to contact me with any questions or concerns.  Sincerely,  Ferol Luz, MD  Allergy & Immunology  [facility] Office: [phone]

## 2022-11-26 NOTE — Patient Instructions (Signed)
Urticaria - well controlled  - Continue Zyrtec 10mg  once  a day  - Try decreasing cyclosporine 100mg  to once a day and see if headaches and GI upset improve  - BMP ordered today - Blood pressure okay today  Allergic rhinitis:  - Previous testing positive to grass, weeds-2023 - Continue Avoidance measures - Continue with:  Zyrtec 10mg  once daily  and Flonase (fluticasone) one spray per nostril daily - You can use an extra dose of the antihistamine, if needed, for breakthrough symptoms.   Follow up: 4 weeks   Thank you so much for letting me partake in your care today.  Don't hesitate to reach out if you have any additional concerns!  Ferol Luz, MD  [facility] [place] [place]

## 2022-11-27 LAB — BASIC METABOLIC PANEL
BUN/Creatinine Ratio: 21 (ref 12–28)
BUN: 24 mg/dL (ref 8–27)
CO2: 20 mmol/L (ref 20–29)
Calcium: 10.8 mg/dL — ABNORMAL HIGH (ref 8.7–10.3)
Chloride: 102 mmol/L (ref 96–106)
Creatinine, Ser: 1.12 mg/dL — ABNORMAL HIGH (ref 0.57–1.00)
Glucose: 96 mg/dL (ref 70–99)
Potassium: 4.5 mmol/L (ref 3.5–5.2)
Sodium: 139 mmol/L (ref 134–144)
eGFR: 55 mL/min/{1.73_m2} — ABNORMAL LOW (ref >59)

## 2022-11-28 NOTE — Progress Notes (Signed)
Kidney function looks just slightly worse.  We are decreasing cyclosporine dose so nothing to do now.  We will se what it looks like in a month.

## 2022-12-16 ENCOUNTER — Ambulatory Visit: Payer: 59 | Admitting: Internal Medicine

## 2022-12-22 ENCOUNTER — Other Ambulatory Visit: Payer: Self-pay | Admitting: Internal Medicine

## 2022-12-25 ENCOUNTER — Encounter: Payer: Self-pay | Admitting: Internal Medicine

## 2022-12-25 ENCOUNTER — Ambulatory Visit (INDEPENDENT_AMBULATORY_CARE_PROVIDER_SITE_OTHER): Payer: Medicare Other | Admitting: Internal Medicine

## 2022-12-25 VITALS — BP 132/92 | HR 94 | Temp 98.6°F | Resp 18

## 2022-12-25 DIAGNOSIS — L501 Idiopathic urticaria: Secondary | ICD-10-CM

## 2022-12-25 DIAGNOSIS — J3089 Other allergic rhinitis: Secondary | ICD-10-CM | POA: Diagnosis not present

## 2022-12-25 NOTE — Progress Notes (Signed)
Follow Up Note  RE: Tiffany Norton MRN: [id number] DOB: 1956/06/15 Date of Office Visit: 12/25/2022  Referring provider: Sigmund Hazel, MD Primary care provider: Sigmund Hazel, MD  Chief Complaint: Follow-up and Urticaria  History of Present Illness: I had the pleasure of seeing Tiffany Norton for a follow up visit at the [facility] on 12/27/2022. She is a 67 y.o. female, who is being followed for idiopathic urticaria . Her previous allergy office visit was on 11/26/22 with Dr. Marlynn Perking. Today is a regular follow up visit.  History obtained from patient, chart review.  Today she reports  Urticaria is controlled. Headaches have improved on cyclosporine 100mg  daily. Still with mild GI upset.   She is taking Zyrtec 10 mg in the morning  For rhinitis is well controlled.   She does have Flonase that she uses as needed a few times a week for nasal symptoms.  She is seeing an endocrinologist fer hypercalcemia.   She is concerned about kidney function and is wondering whether she should stop cyclosporine.   Assessment and Plan: Eldona is a 66 y.o. female with: Idiopathic urticaria - Plan: Basic Metabolic Panel (BMET)  Other allergic rhinitis   Plan: Patient Instructions  Urticaria - well controlled  - Continue Zyrtec 10mg  once  a day  - Continue cyclosporine 100mg  daily  - BMP ordered today - Blood pressure okay today  Allergic rhinitis:  - Previous testing positive to grass, weeds-2023 - Continue Avoidance measures - Continue with:  Zyrtec 10mg  once daily  and Flonase (fluticasone) one spray per nostril daily - You can use an extra dose of the antihistamine, if needed, for breakthrough symptoms.   Follow up: 4 weeks   Thank you so much for letting me partake in your care today.  Don't hesitate to reach out if you have any additional concerns!  Ferol Luz, MD  [facility] [place] [place]   No orders of the defined types  were placed in this encounter.   Lab Orders         Basic Metabolic Panel (BMET)        Diagnostics: None done   Medication List:  Current Outpatient Medications  Medication Sig Dispense Refill   clobetasol ointment (TEMOVATE) 0.05 % Apply 1 Application topically 2 (two) times daily. Use for 2 week twice daily and then use two times a week at bedtime. 60 g 1   cycloSPORINE modified (NEORAL) 100 MG capsule TAKE 1 CAPSULE BY MOUTH TWICE A DAY 180 capsule 1   EPINEPHrine 0.3 mg/0.3 mL IJ SOAJ injection Inject 0.3 mg into the muscle as needed for anaphylaxis. 1 each 1   fluticasone (FLONASE) 50 MCG/ACT nasal spray Place 1 spray into both nostrils daily. 16 g 2   nystatin (MYCOSTATIN/NYSTOP) powder Apply 1 Application topically 3 (three) times daily. Apply to affected area for up to 7 days 15 g 3   nystatin-triamcinolone ointment (MYCOLOG) Apply twice daily to the vulva for one week. 30 g 0   omeprazole (PRILOSEC) 20 MG capsule 1 capsule 30 minutes before morning meal     pravastatin (PRAVACHOL) 40 MG tablet 1 tablet     telmisartan (MICARDIS) 20 MG tablet Take 0.5 tablets (10 mg total) by mouth daily. 45 tablet 0   venlafaxine (EFFEXOR) 75 MG tablet 1 tablet with food     Current Facility-Administered Medications  Medication Dose Route Frequency Provider Last Rate Last Admin   omalizumab Geoffry Paradise) prefilled syringe  300 mg  300 mg Subcutaneous Q28 days Ferol Luz, MD   300 mg at 09/04/22 1614   Allergies: Allergies  Allergen Reactions   Aspirin Hives and Other (See Comments)   Etodolac Hives and Other (See Comments)   Nsaids Other (See Comments)   I reviewed her past medical history, social history, family history, and environmental history and no significant changes have been reported from her previous visit.  ROS: All others negative except as noted per HPI.   Objective: BP (!) 132/92   Pulse 94   Temp 98.6 F (37 C) (Temporal)   Resp 18   SpO2 97%  There is no  height or weight on file to calculate BMI. General Appearance:  Alert, cooperative, no distress, appears stated age  Head:  Normocephalic, without obvious abnormality, atraumatic  Eyes:  Conjunctiva clear, EOM's intact  Nose: Nares normal, erythematous nasal mucosa, no anterior nasal polyps, septum midline  Throat: Lips, tongue normal; teeth and gums normal,   Neck: Supple, symmetrical  Lungs:   , Respirations unlabored, no coughing  Heart:  , Appears well perfused  Extremities: No edema  Skin: Skin color, texture, turgor normal, no rashes or lesions on visualized portions of skin  Neurologic: No gross deficits   Previous notes and tests were reviewed. The plan was reviewed with the patient/family, and all questions/concerned were addressed.  It was my pleasure to see Tiffany Norton today and participate in her care. Please feel free to contact me with any questions or concerns.  Sincerely,  Ferol Luz, MD  Allergy & Immunology  [facility] Office: [phone]

## 2022-12-25 NOTE — Patient Instructions (Addendum)
Urticaria - well controlled  - Continue Zyrtec 10mg  once  a day  - Continue cyclosporine 100mg  daily  - BMP ordered today - Blood pressure okay today  Allergic rhinitis:  - Previous testing positive to grass, weeds-2023 - Continue Avoidance measures - Continue with:  Zyrtec 10mg  once daily  and Flonase (fluticasone) one spray per nostril daily - You can use an extra dose of the antihistamine, if needed, for breakthrough symptoms.   Follow up: 4 weeks   Thank you so much for letting me partake in your care today.  Don't hesitate to reach out if you have any additional concerns!  Ferol Luz, MD  [facility] [place] [place]

## 2022-12-26 LAB — BASIC METABOLIC PANEL
BUN/Creatinine Ratio: 22 (ref 12–28)
BUN: 20 mg/dL (ref 8–27)
CO2: 23 mmol/L (ref 20–29)
Calcium: 10.2 mg/dL (ref 8.7–10.3)
Chloride: 105 mmol/L (ref 96–106)
Creatinine, Ser: 0.92 mg/dL (ref 0.57–1.00)
Glucose: 102 mg/dL — ABNORMAL HIGH (ref 70–99)
Potassium: 4.4 mmol/L (ref 3.5–5.2)
Sodium: 142 mmol/L (ref 134–144)
eGFR: 69 mL/min/{1.73_m2} (ref >59)

## 2022-12-27 NOTE — Progress Notes (Signed)
Calcium and Kidney function have returned to normal.  We could consider stopping cyclosporine or continuing at 100mg  daily dose.  Im okay with either decision and this was discussed with patient during visit.

## 2023-01-16 ENCOUNTER — Other Ambulatory Visit (HOSPITAL_COMMUNITY): Payer: Self-pay | Admitting: Endocrinology

## 2023-01-16 DIAGNOSIS — E210 Primary hyperparathyroidism: Secondary | ICD-10-CM

## 2023-01-27 ENCOUNTER — Ambulatory Visit: Payer: Medicare Other | Admitting: Internal Medicine

## 2023-02-03 ENCOUNTER — Encounter (HOSPITAL_COMMUNITY)
Admission: RE | Admit: 2023-02-03 | Discharge: 2023-02-03 | Disposition: A | Discharge status: Home / Self Care | Payer: Medicare Other | Source: Ambulatory Visit | Attending: Endocrinology | Admitting: Endocrinology

## 2023-02-03 DIAGNOSIS — E210 Primary hyperparathyroidism: Secondary | ICD-10-CM | POA: Insufficient documentation

## 2023-02-03 MED ORDER — TECHNETIUM TC 99M SESTAMIBI GENERIC - CARDIOLITE
25.7000 | Freq: Once | INTRAVENOUS | Status: AC | PRN
  Administered 2023-02-03: 25.7 via INTRAVENOUS

## 2023-03-07 ENCOUNTER — Other Ambulatory Visit (HOSPITAL_COMMUNITY): Payer: Self-pay | Admitting: General Surgery

## 2023-03-07 DIAGNOSIS — E210 Primary hyperparathyroidism: Secondary | ICD-10-CM

## 2023-03-07 NOTE — Addendum Note (Signed)
Encounter addended by: Merideth Abbey on: 03/07/2023 1:08 PM  Actions taken: Imaging Exam ended, Charge Capture section accepted

## 2023-03-26 ENCOUNTER — Ambulatory Visit (HOSPITAL_COMMUNITY)
Admission: RE | Admit: 2023-03-26 | Discharge: 2023-03-26 | Disposition: A | Discharge status: Home / Self Care | Payer: Medicare Other | Source: Ambulatory Visit | Attending: General Surgery | Admitting: General Surgery

## 2023-03-26 DIAGNOSIS — E210 Primary hyperparathyroidism: Secondary | ICD-10-CM | POA: Diagnosis not present

## 2023-03-26 MED ORDER — IOHEXOL 350 MG/ML SOLN
75.0000 mL | Freq: Once | INTRAVENOUS | Status: AC | PRN
  Administered 2023-03-26: 75 mL via INTRAVENOUS

## 2023-04-25 NOTE — Patient Instructions (Signed)
SURGICAL WAITING ROOM VISITATION  Patients having surgery or a procedure may have no more than 2 support people in the waiting area - these visitors may rotate.    Children under the age of 28 must have an adult with them who is not the patient.  Due to an increase in RSV and influenza rates and associated hospitalizations, children ages 70 and under may not visit patients in [facility] hospitals.  If the patient needs to stay at the hospital during part of their recovery, the visitor guidelines for inpatient rooms apply. Pre-op nurse will coordinate an appropriate time for 1 support person to accompany patient in pre-op.  This support person may not rotate.    Please refer to the [facility] website for the visitor guidelines for Inpatients (after your surgery is over and you are in a regular room).       Your procedure is scheduled on: 05/15/23   Report to [facility] Main Entrance    Report to admitting at 5:15 AM   Call this number if you have problems the morning of surgery [phone]   Do not eat food or drink liquids :After Midnight.   Oral Hygiene is also important to reduce your risk of infection.                                    Remember - BRUSH YOUR TEETH THE MORNING OF SURGERY WITH YOUR REGULAR TOOTHPASTE   Stop all vitamins and herbal supplements 7 days before surgery.   Take these medicines the morning of surgery with A SIP OF WATER: Neoral, Omeprazole, Pravastatin, Effexor             You may not have any metal on your body including hair pins, jewelry, and body piercing             Do not wear make-up, lotions, powders, perfumes or deodorant  Do not wear nail polish including gel and S&S, artificial/acrylic nails, or any other type of covering on natural nails including finger and toenails. If you have artificial nails, gel coating, etc. that needs to be removed by a nail salon please have this removed prior to surgery or surgery may need to be  canceled/ delayed if the surgeon/ anesthesia feels like they are unable to be safely monitored.   Do not shave  48 hours prior to surgery.    Do not bring valuables to the hospital. [facility] IS NOT             RESPONSIBLE   FOR VALUABLES.   Contacts, glasses, dentures or bridgework may not be worn into surgery.   Bring small overnight bag day of surgery.   DO NOT BRING YOUR HOME MEDICATIONS TO THE HOSPITAL. PHARMACY WILL DISPENSE MEDICATIONS LISTED ON YOUR MEDICATION LIST TO YOU DURING YOUR ADMISSION IN THE HOSPITAL!    Patients discharged on the day of surgery will not be allowed to drive home.  Someone NEEDS to stay with you for the first 24 hours after anesthesia.   Special Instructions: Bring a copy of your healthcare power of attorney and living will documents the day of surgery if you haven't scanned them before.              Please read over the following fact sheets you were given: IF YOU HAVE QUESTIONS ABOUT YOUR PRE-OP INSTRUCTIONS PLEASE CALL [phone] [facility]   If  you received a COVID test during your pre-op visit  it is requested that you wear a mask when out in public, stay away from anyone that may not be feeling well and notify your surgeon if you develop symptoms. If you test positive for Covid or have been in contact with anyone that has tested positive in the last 10 days please notify you surgeon.    [place] - Preparing for Surgery Before surgery, you can play an important role.  Because skin is not sterile, your skin needs to be as free of germs as possible.  You can reduce the number of germs on your skin by washing with CHG (chlorahexidine gluconate) soap before surgery.  CHG is an antiseptic cleaner which kills germs and bonds with the skin to continue killing germs even after washing. Please DO NOT use if you have an allergy to CHG or antibacterial soaps.  If your skin becomes reddened/irritated stop using the CHG and inform your nurse when you arrive at  Short Stay. Do not shave (including legs and underarms) for at least 48 hours prior to the first CHG shower.  You may shave your face/neck.  Please follow these instructions carefully:  1.  Shower with CHG Soap the night before surgery and the  morning of surgery.  2.  If you choose to wash your hair, wash your hair first as usual with your normal  shampoo.  3.  After you shampoo, rinse your hair and body thoroughly to remove the shampoo.                             4.  Use CHG as you would any other liquid soap.  You can apply chg directly to the skin and wash.  Gently with a scrungie or clean washcloth.  5.  Apply the CHG Soap to your body ONLY FROM THE NECK DOWN.   Do   not use on face/ open                           Wound or open sores. Avoid contact with eyes, ears mouth and   genitals (private parts).                       Wash face,  Genitals (private parts) with your normal soap.             6.  Wash thoroughly, paying special attention to the area where your    surgery  will be performed.  7.  Thoroughly rinse your body with warm water from the neck down.  8.  DO NOT shower/wash with your normal soap after using and rinsing off the CHG Soap.                9.  Pat yourself dry with a clean towel.            10.  Wear clean pajamas.            11.  Place clean sheets on your bed the night of your first shower and do not  sleep with pets. Day of Surgery : Do not apply any lotions/deodorants the morning of surgery.  Please wear clean clothes to the hospital/surgery center.  FAILURE TO FOLLOW THESE INSTRUCTIONS MAY RESULT IN THE CANCELLATION OF YOUR SURGERY  PATIENT SIGNATURE_________________________________  NURSE SIGNATURE__________________________________  ________________________________________________________________________

## 2023-04-25 NOTE — Progress Notes (Signed)
COVID Vaccine received:  []  No [x]  Yes Date of any COVID positive Test in last 90 days: No PCP - Dr. Sigmund Hazel Cardiologist - no  Chest x-ray -  EKG -  04/28/23 Stress Test - no ECHO - no Cardiac Cath -no   Bowel Prep - [x]  No  []   Yes ______  Pacemaker / ICD device [x]  No []  Yes   Spinal Cord Stimulator:[x]  No []  Yes       History of Sleep Apnea? [x]  No []  Yes   CPAP used?- [x]  No []  Yes    Does the patient monitor blood sugar?          [x]  No []  Yes  []  N/A  Patient has: [x]  NO Hx DM   []  Pre-DM                 []  DM1  []   DM2 Does patient have a [employer] or Dexacom? []  No []  Yes   Fasting Blood Sugar Ranges-  Checks Blood Sugar _____ times a day  GLP1 agonist / usual dose - no GLP1 instructions:  SGLT-2 inhibitors / usual dose -no  SGLT-2 instructions:   Blood Thinner / Instructions:no Aspirin Instructions:no  Comments:   Activity level: Patient is  not able  to climb a flight of stairs without difficulty; [x]  No CP  []  No SOB, but would have _Knee pain__   Patient can perform ADLs without assistance.   Anesthesia review: HTN, Obesity, Abnl.  EKG 04/28/23  Patient denies shortness of breath, fever, cough and chest pain at PAT appointment.  Patient verbalized understanding and agreement to the Pre-Surgical Instructions that were given to them at this PAT appointment. Patient was also educated of the need to review these PAT instructions again prior to his/her surgery.I reviewed the appropriate phone numbers to call if they have any and questions or concerns.

## 2023-04-25 NOTE — Progress Notes (Signed)
Please Send preop orders for PST visit 04/28/23

## 2023-04-28 ENCOUNTER — Encounter (HOSPITAL_COMMUNITY): Payer: Self-pay

## 2023-04-28 ENCOUNTER — Encounter (HOSPITAL_COMMUNITY)
Admission: RE | Admit: 2023-04-28 | Discharge: 2023-04-28 | Disposition: A | Discharge status: Home / Self Care | Payer: Medicare Other | Source: Ambulatory Visit | Attending: General Surgery | Admitting: General Surgery

## 2023-04-28 ENCOUNTER — Other Ambulatory Visit: Payer: Self-pay

## 2023-04-28 VITALS — HR 104 | Temp 98.6°F | Resp 18 | Ht 65.0 in | Wt 272.0 lb

## 2023-04-28 DIAGNOSIS — I10 Essential (primary) hypertension: Secondary | ICD-10-CM | POA: Diagnosis not present

## 2023-04-28 DIAGNOSIS — Z01818 Encounter for other preprocedural examination: Secondary | ICD-10-CM | POA: Insufficient documentation

## 2023-04-28 LAB — CBC
HCT: 45.3 % (ref 36.0–46.0)
Hemoglobin: 15.1 g/dL — ABNORMAL HIGH (ref 12.0–15.0)
MCH: 28.7 pg (ref 26.0–34.0)
MCHC: 33.3 g/dL (ref 30.0–36.0)
MCV: 86 fL (ref 80.0–100.0)
Platelets: 393 10*3/uL (ref 150–400)
RBC: 5.27 MIL/uL — ABNORMAL HIGH (ref 3.87–5.11)
RDW: 13.9 % (ref 11.5–15.5)
WBC: 7.9 10*3/uL (ref 4.0–10.5)
nRBC: 0 % (ref 0.0–0.2)

## 2023-04-28 LAB — BASIC METABOLIC PANEL
Anion gap: 11 (ref 5–15)
BUN: 19 mg/dL (ref 8–23)
CO2: 23 mmol/L (ref 22–32)
Calcium: 10.2 mg/dL (ref 8.9–10.3)
Chloride: 103 mmol/L (ref 98–111)
Creatinine, Ser: 0.88 mg/dL (ref 0.44–1.00)
GFR, Estimated: >60 mL/min (ref >60)
Glucose, Bld: 104 mg/dL — ABNORMAL HIGH (ref 70–99)
Potassium: 4.4 mmol/L (ref 3.5–5.1)
Sodium: 137 mmol/L (ref 135–145)

## 2023-04-30 ENCOUNTER — Ambulatory Visit: Payer: Self-pay | Admitting: General Surgery

## 2023-05-14 NOTE — Anesthesia Preprocedure Evaluation (Signed)
Anesthesia Evaluation  Patient identified by MRN, date of birth, ID band Patient awake    Reviewed: Allergy & Precautions, NPO status , Patient's Chart, lab work & pertinent test results  History of Anesthesia Complications Negative for: history of anesthetic complications  Airway Mallampati: III  TM Distance: >3 FB Neck ROM: Full    Dental  (+) Dental Advisory Given,    Pulmonary neg shortness of breath, neg sleep apnea, neg COPD, neg recent URI, former smoker   Pulmonary exam normal breath sounds clear to auscultation       Cardiovascular hypertension (telmisartan), Pt. on medications (-) angina (-) Past MI, (-) Cardiac Stents and (-) CABG (-) dysrhythmias  Rhythm:Regular Rate:Normal  HLD   Neuro/Psych  PSYCHIATRIC DISORDERS  Depression    negative neurological ROS     GI/Hepatic Neg liver ROS,GERD  Medicated,,IBS   Endo/Other  neg diabetes  Morbid obesityPrimary hyperparathyroidism  Renal/GU negative Renal ROS     Musculoskeletal  (+) Arthritis ,    Abdominal  (+) + obese  Peds  Hematology negative hematology ROS (+) Lab Results      Component                Value               Date                      WBC                      7.9                 04/28/2023                HGB                      15.1 (H)            04/28/2023                HCT                      45.3                04/28/2023                MCV                      86.0                04/28/2023                PLT                      393                 04/28/2023              Anesthesia Other Findings   Reproductive/Obstetrics                             Anesthesia Physical Anesthesia Plan  ASA: 3  Anesthesia Plan: General   Post-op Pain Management: Tylenol PO (pre-op)*   Induction: Intravenous  PONV Risk Score and Plan: 3 and Ondansetron, Dexamethasone and Treatment may vary due to age or medical  condition  Airway Management Planned: Oral ETT  Additional Equipment:  Intra-op Plan:   Post-operative Plan: Extubation in OR  Informed Consent: I have reviewed the patients History and Physical, chart, labs and discussed the procedure including the risks, benefits and alternatives for the proposed anesthesia with the patient or authorized representative who has indicated his/her understanding and acceptance.     Dental advisory given  Plan Discussed with: CRNA and Anesthesiologist  Anesthesia Plan Comments: (Risks of general anesthesia discussed including, but not limited to, sore throat, hoarse voice, chipped/damaged teeth, injury to vocal cords, nausea and vomiting, allergic reactions, lung infection, heart attack, stroke, and death. All questions answered. )        Anesthesia Quick Evaluation

## 2023-05-15 ENCOUNTER — Ambulatory Visit (HOSPITAL_COMMUNITY): Payer: Medicare Other | Admitting: Physician Assistant

## 2023-05-15 ENCOUNTER — Other Ambulatory Visit: Payer: Self-pay

## 2023-05-15 ENCOUNTER — Encounter (HOSPITAL_COMMUNITY)
Admission: RE | Disposition: A | Discharge status: Home / Self Care | Payer: Self-pay | Source: Ambulatory Visit | Attending: General Surgery

## 2023-05-15 ENCOUNTER — Observation Stay (HOSPITAL_COMMUNITY)
Admission: RE | Admit: 2023-05-15 | Discharge: 2023-05-16 | Disposition: A | Discharge status: Home / Self Care | Payer: Medicare Other | Source: Ambulatory Visit | Attending: General Surgery | Admitting: General Surgery

## 2023-05-15 ENCOUNTER — Ambulatory Visit (HOSPITAL_BASED_OUTPATIENT_CLINIC_OR_DEPARTMENT_OTHER): Payer: Medicare Other | Admitting: Physician Assistant

## 2023-05-15 ENCOUNTER — Encounter (HOSPITAL_COMMUNITY): Payer: Self-pay | Admitting: General Surgery

## 2023-05-15 DIAGNOSIS — Z87891 Personal history of nicotine dependence: Secondary | ICD-10-CM | POA: Diagnosis not present

## 2023-05-15 DIAGNOSIS — D351 Benign neoplasm of parathyroid gland: Secondary | ICD-10-CM | POA: Diagnosis not present

## 2023-05-15 DIAGNOSIS — I10 Essential (primary) hypertension: Principal | ICD-10-CM | POA: Insufficient documentation

## 2023-05-15 DIAGNOSIS — K449 Diaphragmatic hernia without obstruction or gangrene: Secondary | ICD-10-CM | POA: Diagnosis not present

## 2023-05-15 DIAGNOSIS — Z79899 Other long term (current) drug therapy: Secondary | ICD-10-CM | POA: Diagnosis not present

## 2023-05-15 DIAGNOSIS — E210 Primary hyperparathyroidism: Principal | ICD-10-CM | POA: Diagnosis present

## 2023-05-15 HISTORY — PX: PARATHYROID EXPLORATION: SHX732

## 2023-05-15 SURGERY — EXPLORATION, PARATHYROID
Anesthesia: General

## 2023-05-15 MED ORDER — ROCURONIUM BROMIDE 10 MG/ML (PF) SYRINGE
PREFILLED_SYRINGE | INTRAVENOUS | Status: DC | PRN
  Administered 2023-05-15: 70 mg via INTRAVENOUS

## 2023-05-15 MED ORDER — METOPROLOL TARTRATE 5 MG/5ML IV SOLN: 5.0000 mg | Freq: Four times a day (QID) | INTRAVENOUS | Status: DC | PRN

## 2023-05-15 MED ORDER — CHLORHEXIDINE GLUCONATE 0.12 % MT SOLN
15.0000 mL | Freq: Once | OROMUCOSAL | Status: AC
  Administered 2023-05-15: 15 mL via OROMUCOSAL

## 2023-05-15 MED ORDER — ACETAMINOPHEN 500 MG PO TABS
1000.0000 mg | ORAL_TABLET | ORAL | Status: AC
  Administered 2023-05-15: 1000 mg via ORAL
  Filled 2023-05-15: qty 2

## 2023-05-15 MED ORDER — LIDOCAINE 2% (20 MG/ML) 5 ML SYRINGE
INTRAMUSCULAR | Status: DC | PRN
  Administered 2023-05-15: 100 mg via INTRAVENOUS

## 2023-05-15 MED ORDER — ONDANSETRON 4 MG PO TBDP: 4.0000 mg | ORAL_TABLET | Freq: Four times a day (QID) | ORAL | Status: DC | PRN

## 2023-05-15 MED ORDER — HYDROMORPHONE HCL 1 MG/ML IJ SOLN: 0.5000 mg | INTRAMUSCULAR | Status: DC | PRN

## 2023-05-15 MED ORDER — BUPIVACAINE-EPINEPHRINE 0.25% -1:200000 IJ SOLN
INTRAMUSCULAR | Status: DC | PRN
  Administered 2023-05-15: 5 mL

## 2023-05-15 MED ORDER — BUPIVACAINE-EPINEPHRINE 0.25% -1:200000 IJ SOLN
INTRAMUSCULAR | Status: AC
  Filled 2023-05-15: qty 1

## 2023-05-15 MED ORDER — HEMOSTATIC AGENTS (NO CHARGE) OPTIME
TOPICAL | Status: DC | PRN
Start: 2023-05-15 — End: 2023-05-15
  Administered 2023-05-15: 1 via TOPICAL

## 2023-05-15 MED ORDER — TRAMADOL HCL 50 MG PO TABS: 50.0000 mg | ORAL_TABLET | Freq: Four times a day (QID) | ORAL | Status: DC | PRN

## 2023-05-15 MED ORDER — ONDANSETRON HCL 4 MG/2ML IJ SOLN
INTRAMUSCULAR | Status: DC | PRN
  Administered 2023-05-15: 4 mg via INTRAVENOUS

## 2023-05-15 MED ORDER — FENTANYL CITRATE (PF) 250 MCG/5ML IJ SOLN
INTRAMUSCULAR | Status: AC
  Filled 2023-05-15: qty 5

## 2023-05-15 MED ORDER — DEXAMETHASONE SODIUM PHOSPHATE 4 MG/ML IJ SOLN
INTRAMUSCULAR | Status: DC | PRN
  Administered 2023-05-15: 10 mg via INTRAVENOUS

## 2023-05-15 MED ORDER — PROPOFOL 10 MG/ML IV BOLUS
INTRAVENOUS | Status: AC
  Filled 2023-05-15: qty 20

## 2023-05-15 MED ORDER — PROPOFOL 10 MG/ML IV BOLUS
INTRAVENOUS | Status: DC | PRN
  Administered 2023-05-15: 200 mg via INTRAVENOUS

## 2023-05-15 MED ORDER — ONDANSETRON HCL 4 MG/2ML IJ SOLN: 4.0000 mg | Freq: Four times a day (QID) | INTRAMUSCULAR | Status: DC | PRN

## 2023-05-15 MED ORDER — PANTOPRAZOLE SODIUM 40 MG PO TBEC: 40.0000 mg | DELAYED_RELEASE_TABLET | Freq: Every day | ORAL | Status: DC

## 2023-05-15 MED ORDER — OXYCODONE HCL 5 MG PO TABS: 5.0000 mg | ORAL_TABLET | ORAL | Status: DC | PRN

## 2023-05-15 MED ORDER — LACTATED RINGERS IV SOLN: INTRAVENOUS | Status: DC

## 2023-05-15 MED ORDER — CHLORHEXIDINE GLUCONATE CLOTH 2 % EX PADS: 6.0000 | MEDICATED_PAD | Freq: Once | CUTANEOUS | Status: DC

## 2023-05-15 MED ORDER — FENTANYL CITRATE PF 50 MCG/ML IJ SOSY
25.0000 ug | PREFILLED_SYRINGE | INTRAMUSCULAR | Status: DC | PRN
  Administered 2023-05-15 (×2): 25 ug via INTRAVENOUS

## 2023-05-15 MED ORDER — SUGAMMADEX SODIUM 200 MG/2ML IV SOLN
INTRAVENOUS | Status: DC | PRN
  Administered 2023-05-15: 200 mg via INTRAVENOUS

## 2023-05-15 MED ORDER — FENTANYL CITRATE (PF) 100 MCG/2ML IJ SOLN
INTRAMUSCULAR | Status: DC | PRN
  Administered 2023-05-15: 100 ug via INTRAVENOUS
  Administered 2023-05-15 (×3): 50 ug via INTRAVENOUS

## 2023-05-15 MED ORDER — DIPHENHYDRAMINE HCL 50 MG/ML IJ SOLN: 12.5000 mg | Freq: Four times a day (QID) | INTRAMUSCULAR | Status: DC | PRN

## 2023-05-15 MED ORDER — ORAL CARE MOUTH RINSE: 15.0000 mL | Freq: Once | OROMUCOSAL | Status: AC

## 2023-05-15 MED ORDER — FENTANYL CITRATE PF 50 MCG/ML IJ SOSY
PREFILLED_SYRINGE | INTRAMUSCULAR | Status: AC
  Filled 2023-05-15: qty 1

## 2023-05-15 MED ORDER — VENLAFAXINE HCL 75 MG PO TABS
75.0000 mg | ORAL_TABLET | Freq: Every day | ORAL | Status: DC
  Filled 2023-05-15: qty 1

## 2023-05-15 MED ORDER — 0.9 % SODIUM CHLORIDE (POUR BTL) OPTIME
TOPICAL | Status: DC | PRN
Start: 2023-05-15 — End: 2023-05-15
  Administered 2023-05-15: 1000 mL

## 2023-05-15 MED ORDER — OXYCODONE HCL 5 MG PO TABS: 5.0000 mg | ORAL_TABLET | Freq: Once | ORAL | Status: DC | PRN

## 2023-05-15 MED ORDER — FLUTICASONE PROPIONATE 50 MCG/ACT NA SUSP: 1.0000 | Freq: Every day | NASAL | Status: DC | PRN

## 2023-05-15 MED ORDER — CEFAZOLIN IN SODIUM CHLORIDE 3-0.9 GM/100ML-% IV SOLN
3.0000 g | INTRAVENOUS | Status: AC
  Administered 2023-05-15: 3 g via INTRAVENOUS
  Filled 2023-05-15: qty 100

## 2023-05-15 MED ORDER — IRBESARTAN 75 MG PO TABS: 75.0000 mg | ORAL_TABLET | Freq: Every day | ORAL | Status: DC

## 2023-05-15 MED ORDER — OXYCODONE HCL 5 MG/5ML PO SOLN: 5.0000 mg | Freq: Once | ORAL | Status: DC | PRN

## 2023-05-15 MED ORDER — DIPHENHYDRAMINE HCL 12.5 MG/5ML PO ELIX: 12.5000 mg | ORAL_SOLUTION | Freq: Four times a day (QID) | ORAL | Status: DC | PRN

## 2023-05-15 MED ORDER — ACETAMINOPHEN 500 MG PO TABS
1000.0000 mg | ORAL_TABLET | Freq: Four times a day (QID) | ORAL | Status: DC
  Administered 2023-05-15 – 2023-05-16 (×3): 1000 mg via ORAL
  Filled 2023-05-15 (×3): qty 2

## 2023-05-15 MED ORDER — AMISULPRIDE (ANTIEMETIC) 5 MG/2ML IV SOLN: 10.0000 mg | Freq: Once | INTRAVENOUS | Status: DC | PRN

## 2023-05-15 SURGICAL SUPPLY — 41 items
ADH SKN CLS APL DERMABOND .7 (GAUZE/BANDAGES/DRESSINGS) ×1
APL PRP STRL LF DISP 70% ISPRP (MISCELLANEOUS) ×1
ATTRACTOMAT 16X20 MAGNETIC DRP (DRAPES) ×2 IMPLANT
BLADE SURG 15 STRL LF DISP TIS (BLADE) ×2 IMPLANT
BLADE SURG 15 STRL SS (BLADE) ×1
CHLORAPREP W/TINT 26 (MISCELLANEOUS) ×2 IMPLANT
CLIP TI MEDIUM 6 (CLIP) ×2 IMPLANT
CLIP TI WIDE RED SMALL 6 (CLIP) ×2 IMPLANT
COVER SURGICAL LIGHT HANDLE (MISCELLANEOUS) ×2 IMPLANT
DERMABOND ADVANCED .7 DNX12 (GAUZE/BANDAGES/DRESSINGS) ×2 IMPLANT
DISSECTOR SURG LIGASURE 21 (MISCELLANEOUS) ×2 IMPLANT
DRAPE LAPAROTOMY T 98X78 PEDS (DRAPES) ×2 IMPLANT
DRAPE UTILITY XL STRL (DRAPES) ×2 IMPLANT
ELECT PENCIL ROCKER SW 15FT (MISCELLANEOUS) ×2 IMPLANT
ELECT REM PT RETURN 15FT ADLT (MISCELLANEOUS) ×2 IMPLANT
GAUZE 4X4 16PLY ~~LOC~~+RFID DBL (SPONGE) ×2 IMPLANT
GLOVE BIOGEL PI IND STRL 7.0 (GLOVE) ×2 IMPLANT
GLOVE SURG SS PI 7.0 STRL IVOR (GLOVE) ×2 IMPLANT
GOWN STRL REUS W/ TWL LRG LVL3 (GOWN DISPOSABLE) ×2 IMPLANT
GOWN STRL REUS W/ TWL XL LVL3 (GOWN DISPOSABLE) IMPLANT
GOWN STRL REUS W/TWL LRG LVL3 (GOWN DISPOSABLE) ×1
GOWN STRL REUS W/TWL XL LVL3 (GOWN DISPOSABLE)
HEMOSTAT SURGICEL 2X4 FIBR (HEMOSTASIS) ×2 IMPLANT
ILLUMINATOR WAVEGUIDE N/F (MISCELLANEOUS) ×2 IMPLANT
KIT BASIN OR (CUSTOM PROCEDURE TRAY) ×2 IMPLANT
KIT TURNOVER KIT A (KITS) IMPLANT
NDL HYPO 25X1 1.5 SAFETY (NEEDLE) ×2 IMPLANT
NEEDLE HYPO 25X1 1.5 SAFETY (NEEDLE) ×1
PACK BASIC VI WITH GOWN DISP (CUSTOM PROCEDURE TRAY) ×2 IMPLANT
STAPLER VISISTAT 35W (STAPLE) IMPLANT
SUT MNCRL AB 4-0 PS2 18 (SUTURE) ×2 IMPLANT
SUT SILK 2 0 (SUTURE)
SUT SILK 2-0 18XBRD TIE 12 (SUTURE) IMPLANT
SUT SILK 3 0 (SUTURE)
SUT SILK 3-0 18XBRD TIE 12 (SUTURE) IMPLANT
SUT VIC AB 3-0 SH 18 (SUTURE) ×2 IMPLANT
SYR BULB IRRIG 60ML STRL (SYRINGE) ×2 IMPLANT
SYR CONTROL 10ML LL (SYRINGE) ×2 IMPLANT
TOWEL OR 17X26 10 PK STRL BLUE (TOWEL DISPOSABLE) ×2 IMPLANT
TOWEL OR NON WOVEN STRL DISP B (DISPOSABLE) ×2 IMPLANT
TUBING CONNECTING 10 (TUBING) ×2 IMPLANT

## 2023-05-15 NOTE — Anesthesia Postprocedure Evaluation (Signed)
Anesthesia Post Note  Patient: Tiffany Norton  Procedure(s) Performed: RIGHT INFERIOR PARATHYROIDECTOMY     Patient location during evaluation: PACU Anesthesia Type: General Level of consciousness: awake Pain management: pain level controlled Vital Signs Assessment: post-procedure vital signs reviewed and stable Respiratory status: spontaneous breathing, nonlabored ventilation and respiratory function stable Cardiovascular status: blood pressure returned to baseline and stable Postop Assessment: no apparent nausea or vomiting Anesthetic complications: no   No notable events documented.  Last Vitals:  Vitals:   05/15/23 1008 05/15/23 1112  BP: (!) 163/79 (!) 161/95  Pulse: 81 85  Resp: 16 14  Temp: 36.7 C 36.7 C  SpO2: 98% 94%    Last Pain:  Vitals:   05/15/23 1112  TempSrc: Oral  PainSc:                  [name]

## 2023-05-15 NOTE — Op Note (Signed)
Preoperative diagnosis: primary hyperparathyroidism  Postoperative diagnosis: same   Procedure: right inferior parathyroidectomy  Surgeon: Feliciana Rossetti, M.D.  Asst: Darnell Level, M.D.  Anesthesia: GETA  Indications for procedure: Tiffany Norton is a 67 y.o. year old female with findings of hypercalcemia and hyperparathyroidism with localization to right inferior on CT scan.  Description of procedure: The patient was brought to the operating room and placed in a supine position on the operating room table. Following administration of general anesthesia, the patient was positioned and then prepped and draped in the usual aseptic fashion.   A small Kocher incision was made. Dissection was carried through subcutaneous tissues and platysma. Hemostasis was achieved with the electrocautery. Skin flaps were elevated cephalad and caudad from the thyroid notch to the sternal notch. A wheatlander self-retaining retractor was placed for exposure. Strap muscles were incised in the midline and dissection was begun on the right side.  Blunt dissection was used to separate the strap muscles away from the thyroid tissue. There was a fullness posterior to the inferior portion of the right thyroid. Further dissection revealed a small brown mass. This was freed from surrounding tissue with ligasure and sent to pathology.  After pathology confirmation, fibrillar was placed into the dissection plane. The strap muscles were re-apposed with interrupted 3-0 vicryl. The platysma was re-approximated with interrupted 3-0 vicryl. The skin was closed with 4-0 monocryl in running fashion. Dermabond was placed for dressing. The patient woke up and was taken to PACU in stable condition.  Findings: brown mass posterior to thyroid  Specimen: right inferior parathyroid  Implant: fibrillar   Blood loss: 10 ml  Local anesthesia:  10 ml marcaine   Complications: none  Feliciana Rossetti, M.D. General, Bariatric, &  Minimally Invasive Surgery [facility]

## 2023-05-15 NOTE — Anesthesia Procedure Notes (Signed)
Procedure Name: Intubation Date/Time: 05/15/2023 7:41 AM  Performed by: Vanessa Eureka, CRNAPre-anesthesia Checklist: Patient identified, Emergency Drugs available, Suction available and Patient being monitored Patient Re-evaluated:Patient Re-evaluated prior to induction Oxygen Delivery Method: Circle system utilized Preoxygenation: Pre-oxygenation with 100% oxygen Induction Type: IV induction Ventilation: Mask ventilation without difficulty Laryngoscope Size: 2 and Miller Grade View: Grade I Tube type: Oral Number of attempts: 1 Airway Equipment and Method: Stylet Placement Confirmation: ETT inserted through vocal cords under direct vision, positive ETCO2 and breath sounds checked- equal and bilateral Tube secured with: Tape Dental Injury: Teeth and Oropharynx as per pre-operative assessment

## 2023-05-15 NOTE — Transfer of Care (Signed)
Immediate Anesthesia Transfer of Care Note  Patient: Tiffany Norton  Procedure(s) Performed: RIGHT INFERIOR PARATHYROIDECTOMY  Patient Location: PACU  Anesthesia Type:General  Level of Consciousness: awake and patient cooperative  Airway & Oxygen Therapy: Patient Spontanous Breathing and Patient connected to face mask  Post-op Assessment: Report given to RN and Patient moving all extremities  Post vital signs: Reviewed and stable  Last Vitals:  Vitals Value Taken Time  BP 149/59 05/15/23 0902  Temp    Pulse 77 05/15/23 0904  Resp    SpO2 96 % 05/15/23 0904  Vitals shown include unfiled device data.  Last Pain:  Vitals:   05/15/23 0616  TempSrc: Oral  PainSc:          Complications: No notable events documented.

## 2023-05-15 NOTE — H&P (Signed)
Chief Complaint: New Consultation (Primary hyperparathyroidism)  History of Present Illness: Tiffany Norton is a 68 y.o. female who is seen today as an office consultation for evaluation of New Consultation (Primary hyperparathyroidism)  She has been dealing with high calcium since working with [facility]. Recently, her PTH was elevated to 66. Her sestamibi scan did not localize to any one area.  She has been having hives daily, she has been having headaches and more upset stomach issues. She denies kidney stones, she does have constipation, she denies bone pain or mood changes.  Review of Systems: A complete review of systems was obtained from the patient. I have reviewed this information and discussed as appropriate with the patient. See HPI as well for other ROS.  Review of Systems  Constitutional: Negative.  HENT: Negative.  Eyes: Negative.  Respiratory: Negative.  Cardiovascular: Negative.  Gastrointestinal: Negative.  Genitourinary: Negative.  Musculoskeletal: Negative.  Skin: Positive for rash.  Neurological: Positive for headaches.  Endo/Heme/Allergies: Negative.  Psychiatric/Behavioral: Negative.    Medical History: Past Medical History:  Diagnosis Date  Hypertension  Thyroid disease   There is no problem list on file for this patient.  Past Surgical History:  Procedure Laterality Date  CESAREAN SECTION  elbow surgery  knee surgery    Allergies  Allergen Reactions  Aspirin Hives and Other (See Comments)  Etodolac Hives and Other (See Comments)  Nsaids (Non-Steroidal Anti-Inflammatory Drug) Other (See Comments)   Current Outpatient Medications on File Prior to Visit  Medication Sig Dispense Refill  fluticasone propionate (FLONASE) 50 mcg/actuation nasal spray Place 1 spray into one nostril once daily  telmisartan (MICARDIS) 20 MG tablet Take 10 mg by mouth once daily  venlafaxine (EFFEXOR) 75 MG tablet Take 75 mg by mouth once daily  with food   No current facility-administered medications on file prior to visit.   Family History  Problem Relation Age of Onset  Obesity Mother  High blood pressure (Hypertension) Mother  Colon cancer Mother  Hyperlipidemia (Elevated cholesterol) Father    Social History   Tobacco Use  Smoking Status Never  Smokeless Tobacco Never    Social History   Socioeconomic History  Marital status: Married  Tobacco Use  Smoking status: Never  Smokeless tobacco: Never  Substance and Sexual Activity  Alcohol use: Yes  Comment: 2-3  Drug use: Never   Objective:   Vitals:  03/06/23 1458  BP: (!) 150/70  Pulse: (!) 129  Temp: 36.1 C (97 F)  SpO2: 97%  Weight: (!) 125.2 kg (276 lb)  Height: 165.1 cm (5\' 5" )   Body mass index is 45.93 kg/m.  Physical Exam Constitutional:  Appearance: Normal appearance.  HENT:  Head: Normocephalic and atraumatic.  Pulmonary:  Effort: Pulmonary effort is normal.  Musculoskeletal:  General: Normal range of motion.  Cervical back: Normal range of motion.  Neurological:  General: No focal deficit present.  Mental Status: She is alert and oriented to person, place, and time. Mental status is at baseline.  Psychiatric:  Mood and Affect: Mood normal.  Behavior: Behavior normal.  Thought Content: Thought content normal.   Labs, Imaging and Diagnostic Testing: I reviewed Dr. Willeen Cass notes. Cr 0.94 Calcitriol (Vit D 1,25) 95.6 Ical - 5.6 PTH 66  Assessment and Plan:   Diagnoses and all orders for this visit:  Primary hyperparathyroidism (CMS/HHS-HCC) - CT neck soft tissue with and without contrast; Future  She has hypercalcemia, she has mildly elevated PTH, her urine calcium was >600 consistent  with primary hyperparathyroidism. Interestingly, her Vit D was measured high at 95. She did not localize on NM scan. We will get a CT of the neck and then proceed with 4 gland exploration.  The anatomy and physiology of the thyroid gland  and organs of the neck were discussed. Pathophysiology of thyroid problems were discussed. Options were discussed, and I made a recommendation to remove part (and possibly all) of the thyroid gland to treat the pathology.   Risks of bleeding, infection, injury to other organs including nerves, reoperation, death, and other risks were discussed. I noted a good likelihood this will help address the problem. While there are risks, I feel the risks of nonoperative management are greater; therefore, I feel surgery offers the best option. Educational material was given to help further explain the topics & concerns from our discussion. We will work to minimize complications.

## 2023-05-16 ENCOUNTER — Encounter (HOSPITAL_COMMUNITY): Payer: Self-pay | Admitting: General Surgery

## 2023-05-16 DIAGNOSIS — E210 Primary hyperparathyroidism: Secondary | ICD-10-CM | POA: Diagnosis not present

## 2023-05-16 LAB — BASIC METABOLIC PANEL
Anion gap: 8 (ref 5–15)
BUN: 20 mg/dL (ref 8–23)
CO2: 22 mmol/L (ref 22–32)
Calcium: 9 mg/dL (ref 8.9–10.3)
Chloride: 105 mmol/L (ref 98–111)
Creatinine, Ser: 0.94 mg/dL (ref 0.44–1.00)
GFR, Estimated: >60 mL/min (ref >60)
Glucose, Bld: 124 mg/dL — ABNORMAL HIGH (ref 70–99)
Potassium: 3.9 mmol/L (ref 3.5–5.1)
Sodium: 135 mmol/L (ref 135–145)

## 2023-05-16 LAB — CBC
HCT: 41.6 % (ref 36.0–46.0)
Hemoglobin: 13.1 g/dL (ref 12.0–15.0)
MCH: 27.2 pg (ref 26.0–34.0)
MCHC: 31.5 g/dL (ref 30.0–36.0)
MCV: 86.3 fL (ref 80.0–100.0)
Platelets: 245 10*3/uL (ref 150–400)
RBC: 4.82 MIL/uL (ref 3.87–5.11)
RDW: 14.4 % (ref 11.5–15.5)
WBC: 12.9 10*3/uL — ABNORMAL HIGH (ref 4.0–10.5)
nRBC: 0 % (ref 0.0–0.2)

## 2023-05-16 LAB — SURGICAL PATHOLOGY

## 2023-05-16 NOTE — Plan of Care (Signed)
  Problem: Activity: Goal: Risk for activity intolerance will decrease Outcome: Adequate for Discharge   Problem: Nutrition: Goal: Adequate nutrition will be maintained Outcome: Adequate for Discharge

## 2023-05-16 NOTE — Discharge Summary (Signed)
Physician Discharge Summary  Tiffany Norton ZOX:[id number] DOB: May 07, 1956 DOA: 05/15/2023  PCP: Sigmund Hazel, MD  Admit date: 05/15/2023 Discharge date:  05/16/2023   Recommendations for Outpatient Follow-up:   (include homehealth, outpatient follow-up instructions, specific recommendations for PCP to follow-up on, etc.)   Discharge Diagnoses:  Principal Problem:   Primary hyperparathyroidism [facility])   Surgical Procedure: right inferior parathyroidectomy  Discharge Condition: Good Disposition: Home  Diet recommendation: reg diet   Hospital Course:  67 yo female presented for parathyroidectomy. Post op she was observed on the floor. She did well and was discharged hoem POD 1.  Discharge Instructions  Discharge Instructions     Call MD for:  persistant nausea and vomiting   Complete by: As directed    Call MD for:  severe uncontrolled pain   Complete by: As directed    Call MD for:  temperature >100.4   Complete by: As directed    Diet - low sodium heart healthy   Complete by: As directed    Discharge wound care:   Complete by: As directed    Shower normal tomorrow. Glue to stay on for 10-14 days. No bandage needed.   Increase activity slowly   Complete by: As directed       Allergies as of 05/16/2023       Reactions   Aspirin Hives, Other (See Comments)   Etodolac Hives, Other (See Comments)   Nsaids Other (See Comments)        Medication List     STOP taking these medications    clobetasol ointment 0.05 % Commonly known as: TEMOVATE   cycloSPORINE modified 100 MG capsule Commonly known as: NEORAL   nystatin powder Commonly known as: MYCOSTATIN/NYSTOP   nystatin-triamcinolone ointment Commonly known as: MYCOLOG       TAKE these medications    EPINEPHrine 0.3 mg/0.3 mL Soaj injection Commonly known as: EPI-PEN Inject 0.3 mg into the muscle as needed for anaphylaxis.   fluticasone 50 MCG/ACT nasal spray Commonly known as:  FLONASE Place 1 spray into both nostrils daily. What changed:  when to take this reasons to take this   omeprazole 20 MG capsule Commonly known as: PRILOSEC Take 20 mg by mouth daily.   telmisartan 20 MG tablet Commonly known as: MICARDIS Take 0.5 tablets (10 mg total) by mouth daily.   venlafaxine 75 MG tablet Commonly known as: EFFEXOR Take 75 mg by mouth daily.               Discharge Care Instructions  (From admission, onward)           Start     Ordered   05/16/23 0000  Discharge wound care:       Comments: Shower normal tomorrow. Glue to stay on for 10-14 days. No bandage needed.   05/16/23 0806              The results of significant diagnostics from this hospitalization (including imaging, microbiology, ancillary and laboratory) are listed below for reference.    Significant Diagnostic Studies: No results found.  Labs: Basic Metabolic Panel: Recent Labs  Lab 05/16/23 0415  NA 135  K 3.9  CL 105  CO2 22  GLUCOSE 124*  BUN 20  CREATININE 0.94  CALCIUM 9.0   Liver Function Tests: No results for input(s): "AST", "ALT", "ALKPHOS", "BILITOT", "PROT", "ALBUMIN" in the last 168 hours.  CBC: Recent Labs  Lab 05/16/23 0415  WBC 12.9*  HGB 13.1  HCT 41.6  MCV 86.3  PLT 245    CBG: No results for input(s): "GLUCAP" in the last 168 hours.  Principal Problem:   Primary hyperparathyroidism (HCC)   Time coordinating discharge: 

## 2023-05-16 NOTE — Progress Notes (Signed)
   05/16/23 0844  TOC Brief Assessment  Insurance and Status Reviewed  Patient has primary care physician Yes  Home environment has been reviewed Resides with spouse  Prior level of function: Independent at baseline  Prior/Current Home Services No current home services  Social Determinants of Health Reivew SDOH reviewed no interventions necessary  Readmission risk has been reviewed Yes  Transition of care needs no transition of care needs at this time

## 2023-05-16 NOTE — Progress Notes (Signed)
AVS revieweed w/ pt & s/o - both verbalized an understanding. No other questions at this time. PIV removed by primary nurse. Pt home w/ husband via POV

## 2023-10-02 ENCOUNTER — Encounter: Payer: Self-pay | Admitting: Obstetrics and Gynecology

## 2023-10-04 ENCOUNTER — Encounter: Payer: Self-pay | Admitting: Obstetrics and Gynecology

## 2024-01-01 NOTE — Progress Notes (Deleted)
 68 y.o. G74P1001 Married Caucasian female here for annual exam.    PCP: Perley Bradley, MD   No LMP recorded. Patient is postmenopausal.           Sexually active: Yes  The current method of family planning is {contraception:315051}.    Menopausal hormone therapy:  n/a Exercising: {yes no:314532}  {types:19826} Smoker:  Former  OB History  Gravida Para Term Preterm AB Living  1 1 1   1   SAB IAB Ectopic Multiple Live Births          # Outcome Date GA Lbr Len/2nd Weight Sex Type Anes PTL Lv  1 Term              HEALTH MAINTENANCE: Last 2 paps:  09/19/20 neg HR HPV neg, 10/27/14 neg  History of abnormal Pap or positive HPV:  no Mammogram:   10/01/23 Breast density Cat c, BIRADS Cat 1 neg  Colonoscopy:  10/03/2006 Bone Density:  11/13/21  Result  osteopenia    Immunization History  Administered Date(s) Administered   Influenza,inj,quad, With Preservative 05/20/2019   Influenza-Unspecified 04/18/2017, 06/11/2020      reports that she quit smoking about 10 years ago. Her smoking use included cigarettes. She has never been exposed to tobacco smoke. She has never used smokeless tobacco. She reports current alcohol use of about 2.0 standard drinks of alcohol per week. She reports that she does not use drugs.  Past Medical History:  Diagnosis Date   Acid reflux    Arthritis    Back pain    Bilateral swelling of feet    CIN I (cervical intraepithelial neoplasia I)    Constipation    Depression    Elevated cholesterol    Hyperlipidemia    Hypertension    IBS (irritable bowel syndrome)    Joint pain    Knee pain    Obesity    Plantar fasciitis    PMDD (premenstrual dysphoric disorder)    Prediabetes    Serum calcium elevated    Normal PTH    Past Surgical History:  Procedure Laterality Date   ANAL FISSURE REPAIR     ANKLE SURGERY     Arm Surg     CERVICAL BIOPSY  W/ LOOP ELECTRODE EXCISION  2006   CIN l   CESAREAN SECTION     COLPOSCOPY     PARATHYROID  EXPLORATION  N/A 05/15/2023   Procedure: RIGHT INFERIOR PARATHYROIDECTOMY;  Surgeon: Derral Flick, MD;  Location: [facility];  Service: General;  Laterality: N/A;   TUBAL LIGATION      Current Outpatient Medications  Medication Sig Dispense Refill   EPINEPHrine  0.3 mg/0.3 mL IJ SOAJ injection Inject 0.3 mg into the muscle as needed for anaphylaxis. 1 each 1   fluticasone  (FLONASE ) 50 MCG/ACT nasal spray Place 1 spray into both nostrils daily. (Patient taking differently: Place 1 spray into both nostrils daily as needed for allergies.) 16 g 2   omeprazole (PRILOSEC) 20 MG capsule Take 20 mg by mouth daily.     telmisartan  (MICARDIS ) 20 MG tablet Take 0.5 tablets (10 mg total) by mouth daily. 45 tablet 0   venlafaxine  (EFFEXOR ) 75 MG tablet Take 75 mg by mouth daily.     Current Facility-Administered Medications  Medication Dose Route Frequency Provider Last Rate Last Admin   omalizumab  (XOLAIR ) prefilled syringe 300 mg  300 mg Subcutaneous Q28 days Orelia Binet, MD   300 mg at 09/04/22 1614    ALLERGIES: Aspirin,  Etodolac, and Nsaids  Family History  Problem Relation Age of Onset   Hypertension Mother    Ovarian cancer Mother    Colon cancer Mother    Cancer Mother    Depression Mother    Obesity Mother    Aortic aneurysm Father    Hyperlipidemia Father    Sudden death Father     Review of Systems  PHYSICAL EXAM:  There were no vitals taken for this visit.    General appearance: alert, cooperative and appears stated age Head: normocephalic, without obvious abnormality, atraumatic Neck: no adenopathy, supple, symmetrical, trachea midline and thyroid normal to inspection and palpation Lungs: clear to auscultation bilaterally Breasts: normal appearance, no masses or tenderness, No nipple retraction or dimpling, No nipple discharge or bleeding, No axillary adenopathy Heart: regular rate and rhythm Abdomen: soft, non-tender; no masses, no organomegaly Extremities: extremities normal,  atraumatic, no cyanosis or edema Skin: skin color, texture, turgor normal. No rashes or lesions Lymph nodes: cervical, supraclavicular, and axillary nodes normal. Neurologic: grossly normal  Pelvic: External genitalia:  no lesions              No abnormal inguinal nodes palpated.              Urethra:  normal appearing urethra with no masses, tenderness or lesions              Bartholins and Skenes: normal                 Vagina: normal appearing vagina with normal color and discharge, no lesions              Cervix: no lesions              Pap taken: {yes no:314532} Bimanual Exam:  Uterus:  normal size, contour, position, consistency, mobility, non-tender              Adnexa: no mass, fullness, tenderness              Rectal exam: {yes no:314532}.  Confirms.              Anus:  normal sphincter tone, no lesions  Chaperone was present for exam:  {BSCHAPERONE:31226::"Emily F, CMA"}  ASSESSMENT: Well woman visit with gynecologic exam.  PHQ-9: ***  ***  PLAN: Mammogram screening discussed. Self breast awareness reviewed. Pap and HRV collected:  {yes no:314532} Guidelines for Calcium, Vitamin D , regular exercise program including cardiovascular and weight bearing exercise. Medication refills:  *** {LABS (Optional):23779} Follow up:  ***    Additional counseling given.  {yes [id number]. ***  total time was spent for this patient encounter, including preparation, face-to-face counseling with the patient, coordination of care, and documentation of the encounter in addition to doing the well woman visit with gynecologic exam.

## 2024-01-05 ENCOUNTER — Ambulatory Visit: Payer: Medicare Other | Admitting: Obstetrics and Gynecology

## 2024-02-16 ENCOUNTER — Ambulatory Visit (INDEPENDENT_AMBULATORY_CARE_PROVIDER_SITE_OTHER): Admitting: Obstetrics and Gynecology

## 2024-02-16 ENCOUNTER — Encounter: Payer: Self-pay | Admitting: Obstetrics and Gynecology

## 2024-02-16 VITALS — BP 118/84 | HR 118

## 2024-02-16 DIAGNOSIS — N952 Postmenopausal atrophic vaginitis: Secondary | ICD-10-CM

## 2024-02-16 DIAGNOSIS — N763 Subacute and chronic vulvitis: Secondary | ICD-10-CM

## 2024-02-16 MED ORDER — FLUCONAZOLE 150 MG PO TABS
150.0000 mg | ORAL_TABLET | Freq: Once | ORAL | 0 refills | Status: AC
Start: 2024-02-16 — End: 2024-02-16

## 2024-02-16 MED ORDER — ESTRADIOL 0.1 MG/GM VA CREA: TOPICAL_CREAM | VAGINAL | 1 refills | Status: DC

## 2024-02-16 NOTE — Progress Notes (Signed)
 GYNECOLOGY  VISIT   HPI: 68 y.o.   Married  Caucasian female   G1P1001 with No LMP recorded. Patient is postmenopausal.   here for: Skin is raw and irritated around buttocks and vagina. Has been there for about 6 weeks.    Vulvar biopsy 10/30/21 lichenoid dermatitis and possible early lichen sclerosus.  Received Rx Clobetasol . She stopped this due to burning and itching.   Having bladder issues.  Has urgency.  She saw her PCP and had a negative UC.  Was referred to urology.  She had a vaginal monitoring procedure through urology and will do follow up for results of this.  Is treating with Gemtesa, which is helping the urgency.   Saw her PCP and dermatology about the rash.  She was tx with Nystatin  by dermatology, and she is now feeling better.   Has had resolution of the hives she was having.   Sweating a lot.   Retiring at the end of July.  GYNECOLOGIC HISTORY: No LMP recorded. Patient is postmenopausal. Contraception:  Tubal  Menopausal hormone therapy:  n/a Last 2 paps:  09/19/20 neg HR HPV neg, 10/27/14 neg  History of abnormal Pap or positive HPV:  no Mammogram:  10/01/23 Breast Density Cat C, BIRADS Cat 1 neg         OB History     Gravida  1   Para  1   Term  1   Preterm      AB      Living  1      SAB      IAB      Ectopic      Multiple      Live Births                 Patient Active Problem List   Diagnosis Date Noted   Primary hyperparathyroidism (HCC) 05/15/2023   Other allergic rhinitis 01/02/2022   Chronic idiopathic urticaria 01/02/2022   Vitamin D  deficiency 02/16/2020   Prediabetes 02/16/2020   B12 deficiency 02/16/2020   Class 3 severe obesity with serious comorbidity and body mass index (BMI) of 40.0 to 44.9 in adult 02/16/2020   Essential hypertension 04/18/2016   Other hyperlipidemia    CIN I (cervical intraepithelial neoplasia I)    PMDD (premenstrual dysphoric disorder)    IBS (irritable bowel syndrome)     Past  Medical History:  Diagnosis Date   Acid reflux    Arthritis    Back pain    Bilateral swelling of feet    CIN I (cervical intraepithelial neoplasia I)    Constipation    Depression    Elevated cholesterol    Hyperlipidemia    Hypertension    IBS (irritable bowel syndrome)    Joint pain    Knee pain    Obesity    Plantar fasciitis    PMDD (premenstrual dysphoric disorder)    Prediabetes    Serum calcium elevated    Normal PTH    Past Surgical History:  Procedure Laterality Date   ANAL FISSURE REPAIR     ANKLE SURGERY     Arm Surg     CERVICAL BIOPSY  W/ LOOP ELECTRODE EXCISION  2006   CIN l   CESAREAN SECTION     COLPOSCOPY     PARATHYROID  EXPLORATION N/A 05/15/2023   Procedure: RIGHT INFERIOR PARATHYROIDECTOMY;  Surgeon: Stevie Herlene Righter, MD;  Location: [facility];  Service: General;  Laterality: N/A;   TUBAL  LIGATION      Current Outpatient Medications  Medication Sig Dispense Refill   EPINEPHrine  0.3 mg/0.3 mL IJ SOAJ injection Inject 0.3 mg into the muscle as needed for anaphylaxis. 1 each 1   estradiol (ESTRACE) 0.1 MG/GM vaginal cream Use 1/2 g vaginally every night for the first 2 weeks, then use 1/2 g vaginally two or three times per week as needed to maintain symptom relief. 42.5 g 1   fluconazole  (DIFLUCAN ) 150 MG tablet Take 1 tablet (150 mg total) by mouth once for 1 dose. Take one tablet (150 mg) by mouth every 3 days for 3 doses. 3 tablet 0   fluticasone  (FLONASE ) 50 MCG/ACT nasal spray Place 1 spray into both nostrils daily. (Patient taking differently: Place 1 spray into both nostrils daily as needed for allergies.) 16 g 2   nystatin -triamcinolone  (MYCOLOG II) cream Apply topically 2 (two) times daily as needed.     omeprazole (PRILOSEC) 20 MG capsule Take 20 mg by mouth daily.     telmisartan  (MICARDIS ) 20 MG tablet Take 0.5 tablets (10 mg total) by mouth daily. 45 tablet 0   venlafaxine  (EFFEXOR ) 75 MG tablet Take 75 mg by mouth daily.     Vibegron  (GEMTESA PO) Take by mouth.     Current Facility-Administered Medications  Medication Dose Route Frequency Provider Last Rate Last Admin   omalizumab  (XOLAIR ) prefilled syringe 300 mg  300 mg Subcutaneous Q28 days Lorin Norris, MD   300 mg at 09/04/22 1614     ALLERGIES: Aspirin, Etodolac, Iodine, and Nsaids  Family History  Problem Relation Age of Onset   Hypertension Mother    Ovarian cancer Mother    Colon cancer Mother    Cancer Mother    Depression Mother    Obesity Mother    Aortic aneurysm Father    Hyperlipidemia Father    Sudden death Father     Social History   Socioeconomic History   Marital status: Married    Spouse name: Not on file   Number of children: Not on file   Years of education: Not on file   Highest education level: Not on file  Occupational History   Not on file  Tobacco Use   Smoking status: Former    Current packs/day: 0.00    Types: Cigarettes    Quit date: 2015    Years since quitting: 10.5    Passive exposure: Never   Smokeless tobacco: Never  Vaping Use   Vaping status: Never Used  Substance and Sexual Activity   Alcohol use: Yes    Alcohol/week: 2.0 standard drinks of alcohol    Types: 2 Glasses of wine per week    Comment: weekly   Drug use: No   Sexual activity: Yes    Birth control/protection: Surgical    Comment: intercourse early 20's, less than 5 sexual partners  Other Topics Concern   Not on file  Social History Narrative   Not on file   Social Drivers of Health   Financial Resource Strain: Not on file  Food Insecurity: Patient Declined (05/15/2023)   Hunger Vital Sign    Worried About Running Out of Food in the Last Year: Patient declined    Ran Out of Food in the Last Year: Patient declined  Transportation Needs: Patient Declined (05/15/2023)   PRAPARE - Administrator, Civil Service (Medical): Patient declined    Lack of Transportation (Non-Medical): Patient declined  Physical Activity: Not on  file  Stress: Not on file  Social Connections: Not on file  Intimate Partner Violence: Patient Declined (05/15/2023)   Humiliation, Afraid, Rape, and Kick questionnaire    Fear of Current or Ex-Partner: Patient declined    Emotionally Abused: Patient declined    Physically Abused: Patient declined    Sexually Abused: Patient declined    Review of Systems  All other systems reviewed and are negative.   PHYSICAL EXAMINATION:   BP 118/84 (BP Location: Left Arm, Patient Position: Sitting)   Pulse (!) 118   SpO2 97%     General appearance: alert, cooperative and appears stated age  Pelvic: External genitalia: erythema and pale coloration of the vulva.  Macerated tissue of the right groin.                Urethra:  normal appearing urethra with no masses, tenderness or lesions              Bartholins and Skenes: normal                 Vagina: atrophy noted.               Cervix: no lesions                Bimanual Exam:  Uterus:  normal size, contour, position, consistency, mobility, non-tender              Adnexa: no mass, fullness, tenderness         Chaperone was present for exam:  Kari HERO, CMA  ASSESSMENT:  Chronic vulvitis.  Looks like yeast infection.  Also has hx of lichenoid dermatitis.  Clobetasol  caused burning symptoms in past.  Vaginal atrophy.  PLAN:  Nuswab vaginitis testing.  Check A1C.  Estradiol cream 1/2 gram pv at hs nightly for 2 weeks and then 1/2 gram pv at hs 2 - 3 times per week.  I discussed potential effect on breast cancer.  Diflucan  150 mg po q 3 days x 3 doses.  FU in 2 - 3 weeks.   26 min  total time was spent for this patient encounter, including preparation, face-to-face counseling with the patient, coordination of care, and documentation of the encounter.

## 2024-02-17 ENCOUNTER — Other Ambulatory Visit (HOSPITAL_COMMUNITY)
Admission: RE | Admit: 2024-02-17 | Discharge: 2024-02-17 | Disposition: A | Discharge status: Home / Self Care | Source: Ambulatory Visit | Attending: Obstetrics and Gynecology | Admitting: Obstetrics and Gynecology

## 2024-02-17 DIAGNOSIS — N763 Subacute and chronic vulvitis: Secondary | ICD-10-CM | POA: Insufficient documentation

## 2024-02-17 LAB — HEMOGLOBIN A1C
Hgb A1c MFr Bld: 5.8 % — ABNORMAL HIGH (ref <5.7)
Mean Plasma Glucose: 120 mg/dL
eAG (mmol/L): 6.6 mmol/L

## 2024-02-17 NOTE — Addendum Note (Signed)
 Addended by: CATHLYN JAYSON CARY, BOBIE E on: 02/17/2024 04:18 PM   Modules accepted: Orders

## 2024-02-18 ENCOUNTER — Ambulatory Visit: Payer: Self-pay | Admitting: Obstetrics and Gynecology

## 2024-02-19 LAB — CERVICOVAGINAL ANCILLARY ONLY
Bacterial Vaginitis (gardnerella): NEGATIVE
Candida Glabrata: NEGATIVE
Candida Vaginitis: NEGATIVE
Comment: NEGATIVE
Comment: NEGATIVE
Comment: NEGATIVE
Comment: NEGATIVE
Trichomonas: NEGATIVE

## 2024-03-22 ENCOUNTER — Encounter: Payer: Self-pay | Admitting: Obstetrics and Gynecology

## 2024-03-22 ENCOUNTER — Ambulatory Visit (INDEPENDENT_AMBULATORY_CARE_PROVIDER_SITE_OTHER): Admitting: Obstetrics and Gynecology

## 2024-03-22 VITALS — BP 124/82 | HR 87

## 2024-03-22 DIAGNOSIS — N763 Subacute and chronic vulvitis: Secondary | ICD-10-CM

## 2024-03-22 MED ORDER — CLOTRIMAZOLE-BETAMETHASONE 1-0.05 % EX CREA: 1.0000 | TOPICAL_CREAM | Freq: Two times a day (BID) | CUTANEOUS | 1 refills | Status: DC

## 2024-03-22 NOTE — Progress Notes (Signed)
 GYNECOLOGY  VISIT   HPI: 68 y.o.   Married  Caucasian female   G1P1001 with No LMP recorded. Patient is postmenopausal.   here for: 2 week follow up- Chronic vulvitis also using Estrace  Did treatment of vulvar yeast infection with course of Diflucan  150 mg po q 3 days x 3 doses.   Started vaginal estradiol  cream, which is helpful.   Has nystatin  from her dermatologist.   Vulvar biopsy 10/30/21 lichenoid dermatitis and possible early lichen sclerosus.   A1C 5.8 on 02/16/24.  Urology gave a prescription for overactive bladder and she stopped due to side effects.   Patient and husband just retired from [employer].    GYNECOLOGIC HISTORY: No LMP recorded. Patient is postmenopausal. Contraception:  Tubal Menopausal hormone therapy:  estrace  Last 2 paps:  09/19/20 neg HR HPV neg, 10/27/14 neg  History of abnormal Pap or positive HPV:  no Mammogram:  10/01/23 Breast Density Cat C, BIRADS Cat 1 neg         OB History     Gravida  1   Para  1   Term  1   Preterm      AB      Living  1      SAB      IAB      Ectopic      Multiple      Live Births                 Patient Active Problem List   Diagnosis Date Noted   Primary hyperparathyroidism (HCC) 05/15/2023   Other allergic rhinitis 01/02/2022   Chronic idiopathic urticaria 01/02/2022   Vitamin D  deficiency 02/16/2020   Prediabetes 02/16/2020   B12 deficiency 02/16/2020   Class 3 severe obesity with serious comorbidity and body mass index (BMI) of 40.0 to 44.9 in adult 02/16/2020   Essential hypertension 04/18/2016   Other hyperlipidemia    CIN I (cervical intraepithelial neoplasia I)    PMDD (premenstrual dysphoric disorder)    IBS (irritable bowel syndrome)     Past Medical History:  Diagnosis Date   Acid reflux    Arthritis    Back pain    Bilateral swelling of feet    CIN I (cervical intraepithelial neoplasia I)    Constipation    Depression    Elevated cholesterol     Hyperlipidemia    Hypertension    IBS (irritable bowel syndrome)    Joint pain    Knee pain    Obesity    Plantar fasciitis    PMDD (premenstrual dysphoric disorder)    Prediabetes    Serum calcium elevated    Normal PTH    Past Surgical History:  Procedure Laterality Date   ANAL FISSURE REPAIR     ANKLE SURGERY     Arm Surg     CERVICAL BIOPSY  W/ LOOP ELECTRODE EXCISION  2006   CIN l   CESAREAN SECTION     COLPOSCOPY     PARATHYROID  EXPLORATION N/A 05/15/2023   Procedure: RIGHT INFERIOR PARATHYROIDECTOMY;  Surgeon: Stevie Herlene Righter, MD;  Location: [facility];  Service: General;  Laterality: N/A;   TUBAL LIGATION      Current Outpatient Medications  Medication Sig Dispense Refill   EPINEPHrine  0.3 mg/0.3 mL IJ SOAJ injection Inject 0.3 mg into the muscle as needed for anaphylaxis. 1 each 1   estradiol  (ESTRACE ) 0.1 MG/GM vaginal cream Use 1/2 g vaginally every night for the  first 2 weeks, then use 1/2 g vaginally two or three times per week as needed to maintain symptom relief. 42.5 g 1   fluticasone  (FLONASE ) 50 MCG/ACT nasal spray Place 1 spray into both nostrils daily. (Patient taking differently: Place 1 spray into both nostrils daily as needed for allergies.) 16 g 2   nystatin -triamcinolone  (MYCOLOG II) cream Apply topically 2 (two) times daily as needed.     omeprazole (PRILOSEC) 20 MG capsule Take 20 mg by mouth daily.     telmisartan  (MICARDIS ) 20 MG tablet Take 0.5 tablets (10 mg total) by mouth daily. 45 tablet 0   venlafaxine  (EFFEXOR ) 75 MG tablet Take 75 mg by mouth daily.     Vibegron (GEMTESA PO) Take by mouth.     Current Facility-Administered Medications  Medication Dose Route Frequency Provider Last Rate Last Admin   omalizumab  (XOLAIR ) prefilled syringe 300 mg  300 mg Subcutaneous Q28 days Lorin Norris, MD   300 mg at 09/04/22 1614     ALLERGIES: Aspirin, Etodolac, Iodine, and Nsaids  Family History  Problem Relation Age of Onset   Hypertension  Mother    Ovarian cancer Mother    Colon cancer Mother    Cancer Mother    Depression Mother    Obesity Mother    Aortic aneurysm Father    Hyperlipidemia Father    Sudden death Father     Social History   Socioeconomic History   Marital status: Married    Spouse name: Not on file   Number of children: Not on file   Years of education: Not on file   Highest education level: Not on file  Occupational History   Not on file  Tobacco Use   Smoking status: Former    Current packs/day: 0.00    Types: Cigarettes    Quit date: 2015    Years since quitting: 10.5    Passive exposure: Never   Smokeless tobacco: Never  Vaping Use   Vaping status: Never Used  Substance and Sexual Activity   Alcohol use: Yes    Alcohol/week: 2.0 standard drinks of alcohol    Types: 2 Glasses of wine per week    Comment: weekly   Drug use: No   Sexual activity: Yes    Birth control/protection: Surgical    Comment: intercourse early 20's, less than 5 sexual partners  Other Topics Concern   Not on file  Social History Narrative   Not on file   Social Drivers of Health   Financial Resource Strain: Not on file  Food Insecurity: Patient Declined (05/15/2023)   Hunger Vital Sign    Worried About Running Out of Food in the Last Year: Patient declined    Ran Out of Food in the Last Year: Patient declined  Transportation Needs: Patient Declined (05/15/2023)   PRAPARE - Administrator, Civil Service (Medical): Patient declined    Lack of Transportation (Non-Medical): Patient declined  Physical Activity: Not on file  Stress: Not on file  Social Connections: Not on file  Intimate Partner Violence: Patient Declined (05/15/2023)   Humiliation, Afraid, Rape, and Kick questionnaire    Fear of Current or Ex-Partner: Patient declined    Emotionally Abused: Patient declined    Physically Abused: Patient declined    Sexually Abused: Patient declined    Review of Systems  All other systems  reviewed and are negative.   PHYSICAL EXAMINATION:   BP 124/82 (BP Location: Left Arm, Patient Position: Sitting)  Pulse 87   SpO2 98%     General appearance: alert, cooperative and appears stated age    Pelvic: External genitalia:  generalized erythema of the skin of the vulva, perianal region, and flexural skin folds.  A few petechia of the posterior perianal region.  Thickening and white change of skin of perineum.               Urethra:  normal appearing urethra with no masses, tenderness or lesions              Bartholins and Skenes: normal                 Vagina: normal appearing vagina with normal color and discharge, no lesions              Cervix: no lesions                Bimanual Exam:  Uterus:  normal size, contour, position, consistency, mobility, non-tender              Adnexa: no mass, fullness, tenderness           Chaperone was present for exam:  Erica L, CMA  ASSESSMENT:  Chronic vulvitis.  I suspect chronic yeast and lichen sclerosus. Allergy  to Clobetasol  ointment.   PLAN:  Lotrisone  cream twice daily for 2 - 3 weeks and then use twice weekly.  Ok to continue vaginal estrogen cream.  Fu in 4 weeks for vulvar biopsy.  Rationale explained to rule out dysplasia.   22 min  total time was spent for this patient encounter, including preparation, face-to-face counseling with the patient, coordination of care, and documentation of the encounter.

## 2024-05-10 ENCOUNTER — Other Ambulatory Visit (HOSPITAL_COMMUNITY)
Admission: RE | Admit: 2024-05-10 | Discharge: 2024-05-10 | Disposition: A | Discharge status: Home / Self Care | Source: Ambulatory Visit | Attending: Obstetrics and Gynecology | Admitting: Obstetrics and Gynecology

## 2024-05-10 ENCOUNTER — Ambulatory Visit (INDEPENDENT_AMBULATORY_CARE_PROVIDER_SITE_OTHER): Admitting: Obstetrics and Gynecology

## 2024-05-10 ENCOUNTER — Encounter: Payer: Self-pay | Admitting: Obstetrics and Gynecology

## 2024-05-10 VITALS — BP 122/84 | HR 88

## 2024-05-10 DIAGNOSIS — N763 Subacute and chronic vulvitis: Secondary | ICD-10-CM | POA: Insufficient documentation

## 2024-05-10 NOTE — Addendum Note (Signed)
 Addended by: CATHLYN JAYSON NIKKI BOBIE E on: 05/10/2024 04:47 PM   Modules accepted: Orders

## 2024-05-10 NOTE — Patient Instructions (Signed)
 Vulva Biopsy, Care After After a vulva biopsy, it is common to have: Slight bleeding from the biopsy site. Soreness or slight pain at the biopsy site. Follow these instructions at home: Your doctor may give you more instructions. If you have problems, contact your doctor. Biopsy site care  Follow instructions from your doctor about how to take care of your biopsy site. Make sure you: Clean the area using water and mild soap two times a day or as told by your doctor. Gently pat the area dry. You may shower 24 hours after the procedure. If you were prescribed an antibiotic ointment, apply it as told by your doctor. Do not stop using the antibiotic even if your condition gets better. If told by your doctor, take a warm water bath (sitz bath) to help with pain and soreness. A sitz bath is taken while you are sitting down. Do this as often as told by your doctor. The water should only come up to your hips and cover your butt. You may pat the area dry with a soft, clean towel. Leave stitches (sutures) or skin glue in place for at least 2 weeks. Leave tape strips alone unless you are told to take them off. You may trim the edges of the tape strips if they curl up. Check your biopsy area every day for signs of infection. It may be helpful to use a handheld mirror to do this. Check for: Redness, swelling, or more pain. More fluid or blood. Warmth. Pus or a bad smell. Do not rub the biopsy area after peeing (urinating). Gently pat the area dry, or use a bottle filled with warm water (peri bottle) to clean the area. Gently wipe from front to back. Lifestyle Wear loose, cotton underwear. Do not wear tight pants. For at least 1 week or until your doctor says it is okay: Do not use a tampon, douche, or put anything in your vagina. Do not have sex. Until your doctor says it is okay: Do not exercise. Do not swim or use a hot tub. General instructions Take over-the-counter and prescription  medicines only as told by your doctor. Drink enough fluid to keep your pee (urine) pale yellow. Use a sanitary pad until the bleeding stops. If told, put ice on the biopsy site. To do this: Place ice in a plastic bag. Place a towel between your skin and the bag. Leave the ice on for 20 minutes, 2-3 times a day. Take off the ice if your skin turns bright red. This is very important. If you cannot feel pain, heat, or cold, you have a greater risk of damage to the area. Keep all follow-up visits. Contact a doctor if: You have redness, swelling, or more pain around your biopsy site. You have more fluid or blood coming from your biopsy site. Your biopsy site feels warm when you touch it. Medicines or ice packs do not help with your pain. You have a fever or chills. Get help right away if: You have a lot of bleeding from the vulva. You have pus or a bad smell coming from your biopsy site. You have pain in your belly (abdomen). Summary After the procedure, it is common to have slight bleeding and soreness at the biopsy site. Follow all instructions as told by your doctor. Take sitz baths as told by your doctor. Leave any stitches in place. Check your biopsy site for infection. Signs include redness, swelling, more pain, more fluid or blood, or warmth. Get help  right away if you have a lot of bleeding, pus or a bad smell, or pain in your belly. This information is not intended to replace advice given to you by your health care provider. Make sure you discuss any questions you have with your health care provider. Document Revised: 04/24/2021 Document Reviewed: 04/24/2021 Elsevier Patient Education  2024 [employer].

## 2024-05-10 NOTE — Progress Notes (Signed)
 GYNECOLOGY  VISIT   HPI: 68 y.o.   Married  Caucasian female   G1P1001 with No LMP recorded. Patient is postmenopausal.   here for: Vulvar biopsy      Prior vulvar biopsy 10/30/21 lichenoid dermatitis and possible early lichen sclerosus.   Has urinary incontinence and sees Dr. Gaston.  She has overactive bladder.  Louanne was not covered, so she is taking another medication.   Denies accidental leakage of stool.   Notes that she sweats a lot.   GYNECOLOGIC HISTORY: No LMP recorded. Patient is postmenopausal. Contraception:  tubal  Menopausal hormone therapy:  estrace  Last 2 paps:  09/19/20 neg HR HPV neg, 10/27/14 neg  History of abnormal Pap or positive HPV:  no Mammogram:  10/01/23 Breast Density Cat C, BIRADS Cat 1 neg        OB History     Gravida  1   Para  1   Term  1   Preterm      AB      Living  1      SAB      IAB      Ectopic      Multiple      Live Births                 Patient Active Problem List   Diagnosis Date Noted   Primary hyperparathyroidism (HCC) 05/15/2023   Other allergic rhinitis 01/02/2022   Chronic idiopathic urticaria 01/02/2022   Vitamin D  deficiency 02/16/2020   Prediabetes 02/16/2020   B12 deficiency 02/16/2020   Class 3 severe obesity with serious comorbidity and body mass index (BMI) of 40.0 to 44.9 in adult 02/16/2020   Essential hypertension 04/18/2016   Other hyperlipidemia    CIN I (cervical intraepithelial neoplasia I)    PMDD (premenstrual dysphoric disorder)    IBS (irritable bowel syndrome)     Past Medical History:  Diagnosis Date   Acid reflux    Arthritis    Back pain    Bilateral swelling of feet    CIN I (cervical intraepithelial neoplasia I)    Constipation    Depression    Elevated cholesterol    Hyperlipidemia    Hypertension    IBS (irritable bowel syndrome)    Joint pain    Knee pain    Obesity    Plantar fasciitis    PMDD (premenstrual dysphoric disorder)    Prediabetes     Serum calcium elevated    Normal PTH    Past Surgical History:  Procedure Laterality Date   ANAL FISSURE REPAIR     ANKLE SURGERY     Arm Surg     CERVICAL BIOPSY  W/ LOOP ELECTRODE EXCISION  2006   CIN l   CESAREAN SECTION     COLPOSCOPY     PARATHYROID  EXPLORATION N/A 05/15/2023   Procedure: RIGHT INFERIOR PARATHYROIDECTOMY;  Surgeon: Stevie Herlene Righter, MD;  Location: [facility];  Service: General;  Laterality: N/A;   TUBAL LIGATION      Current Outpatient Medications  Medication Sig Dispense Refill   clotrimazole -betamethasone  (LOTRISONE ) cream Apply 1 Application topically 2 (two) times daily. Use twice a day for 2 - 3 weeks.  Then use twice a week at bed time. 30 g 1   EPINEPHrine  0.3 mg/0.3 mL IJ SOAJ injection Inject 0.3 mg into the muscle as needed for anaphylaxis. 1 each 1   estradiol  (ESTRACE ) 0.1 MG/GM vaginal cream Use 1/2 g vaginally  every night for the first 2 weeks, then use 1/2 g vaginally two or three times per week as needed to maintain symptom relief. 42.5 g 1   fluticasone  (FLONASE ) 50 MCG/ACT nasal spray Place 1 spray into both nostrils daily. (Patient taking differently: Place 1 spray into both nostrils daily as needed for allergies.) 16 g 2   nystatin -triamcinolone  (MYCOLOG II) cream Apply topically 2 (two) times daily as needed.     omeprazole (PRILOSEC) 20 MG capsule Take 20 mg by mouth daily.     telmisartan  (MICARDIS ) 20 MG tablet Take 0.5 tablets (10 mg total) by mouth daily. 45 tablet 0   venlafaxine  (EFFEXOR ) 75 MG tablet Take 75 mg by mouth daily.     Vibegron (GEMTESA PO) Take by mouth.     Current Facility-Administered Medications  Medication Dose Route Frequency Provider Last Rate Last Admin   omalizumab  (XOLAIR ) prefilled syringe 300 mg  300 mg Subcutaneous Q28 days Lorin Norris, MD   300 mg at 09/04/22 1614     ALLERGIES: Aspirin, Etodolac, Iodine, and Nsaids  Family History  Problem Relation Age of Onset   Hypertension Mother    Ovarian  cancer Mother    Colon cancer Mother    Cancer Mother    Depression Mother    Obesity Mother    Aortic aneurysm Father    Hyperlipidemia Father    Sudden death Father     Social History   Socioeconomic History   Marital status: Married    Spouse name: Not on file   Number of children: Not on file   Years of education: Not on file   Highest education level: Not on file  Occupational History   Not on file  Tobacco Use   Smoking status: Former    Current packs/day: 0.00    Types: Cigarettes    Quit date: 2015    Years since quitting: 10.7    Passive exposure: Never   Smokeless tobacco: Never  Vaping Use   Vaping status: Never Used  Substance and Sexual Activity   Alcohol use: Yes    Alcohol/week: 2.0 standard drinks of alcohol    Types: 2 Glasses of wine per week    Comment: weekly   Drug use: No   Sexual activity: Yes    Partners: Male    Birth control/protection: Surgical    Comment: intercourse early 20's, less than 5 sexual partners  Other Topics Concern   Not on file  Social History Narrative   Not on file   Social Drivers of Health   Financial Resource Strain: Not on file  Food Insecurity: Patient Declined (05/15/2023)   Hunger Vital Sign    Worried About Running Out of Food in the Last Year: Patient declined    Ran Out of Food in the Last Year: Patient declined  Transportation Needs: Patient Declined (05/15/2023)   PRAPARE - Administrator, Civil Service (Medical): Patient declined    Lack of Transportation (Non-Medical): Patient declined  Physical Activity: Not on file  Stress: Not on file  Social Connections: Not on file  Intimate Partner Violence: Patient Declined (05/15/2023)   Humiliation, Afraid, Rape, and Kick questionnaire    Fear of Current or Ex-Partner: Patient declined    Emotionally Abused: Patient declined    Physically Abused: Patient declined    Sexually Abused: Patient declined    Review of Systems  All other systems  reviewed and are negative.   PHYSICAL EXAMINATION:   BP  122/84 (BP Location: Left Arm, Patient Position: Sitting)   Pulse 88     General appearance: alert, cooperative and appears stated age  Pelvic: External genitalia and perianal region:  pale skin coloration of the clitoral region, perineum and bilateral perianal regions.  Also has red chapped appearing skin.  The right and left perineum have each a 7 - 8 mm area of white thickened tissue.              Vulvar and perianal biopsies.  Consent and time out done.  Sterile prep with Hibiclens . Local 1% lidocaine , lot [id number], exp Feb, 2028. 4 mm punch biopsy of left perianal and then left perineal area. To pathology separately.  Two 3/0 interrupted sutures to the left perineum and one 3/0 interrupted suture to the left perianal region.  Minimal EBL.  No complications.   Chaperone was present for exam:  Kari HERO, CMA  ASSESSMENT:  Chronic vulvitis.  Overactive bladder.   PLAN:  FU left perianal and left perineal biopsies.  Continue Lotrisone  cream.    Consider zinc oxide.   She will follow up with urology.

## 2024-05-13 ENCOUNTER — Ambulatory Visit: Payer: Self-pay | Admitting: Obstetrics and Gynecology

## 2024-05-13 DIAGNOSIS — D049 Carcinoma in situ of skin, unspecified: Secondary | ICD-10-CM

## 2024-05-13 LAB — SURGICAL PATHOLOGY

## 2024-05-14 ENCOUNTER — Telehealth: Payer: Self-pay | Admitting: *Deleted

## 2024-05-14 ENCOUNTER — Encounter: Payer: Self-pay | Admitting: Psychiatry

## 2024-05-14 NOTE — Telephone Encounter (Signed)
 Spoke with the patient regarding the referral to GYN oncology. Patient scheduled as new patient with Dr Eldonna on 9/29 at 9 am.  Patient given an arrival time of 8:30 am.  Explained to the patient the the doctor will perform a pelvic exam at this visit. Patient given the policy that only one visitor allowed and that visitor must be over 16 yrs are allowed in the Cancer Center. Patient given the address/phone number for the clinic and that the center offers free valet service. Patient aware that masks optional.

## 2024-05-17 ENCOUNTER — Encounter: Payer: Self-pay | Admitting: Psychiatry

## 2024-05-17 ENCOUNTER — Inpatient Hospital Stay (HOSPITAL_BASED_OUTPATIENT_CLINIC_OR_DEPARTMENT_OTHER): Admitting: Gynecologic Oncology

## 2024-05-17 ENCOUNTER — Inpatient Hospital Stay: Attending: Psychiatry | Admitting: Psychiatry

## 2024-05-17 VITALS — BP 137/72 | HR 97 | Temp 98.4°F | Resp 20 | Ht 65.0 in | Wt 258.4 lb

## 2024-05-17 DIAGNOSIS — F419 Anxiety disorder, unspecified: Secondary | ICD-10-CM | POA: Insufficient documentation

## 2024-05-17 DIAGNOSIS — Z79890 Hormone replacement therapy (postmenopausal): Secondary | ICD-10-CM | POA: Diagnosis not present

## 2024-05-17 DIAGNOSIS — E7800 Pure hypercholesterolemia, unspecified: Secondary | ICD-10-CM | POA: Insufficient documentation

## 2024-05-17 DIAGNOSIS — N903 Dysplasia of vulva, unspecified: Secondary | ICD-10-CM

## 2024-05-17 DIAGNOSIS — F32A Depression, unspecified: Secondary | ICD-10-CM | POA: Insufficient documentation

## 2024-05-17 DIAGNOSIS — Z800 Family history of malignant neoplasm of digestive organs: Secondary | ICD-10-CM | POA: Insufficient documentation

## 2024-05-17 DIAGNOSIS — K5900 Constipation, unspecified: Secondary | ICD-10-CM | POA: Insufficient documentation

## 2024-05-17 DIAGNOSIS — Z87891 Personal history of nicotine dependence: Secondary | ICD-10-CM | POA: Insufficient documentation

## 2024-05-17 DIAGNOSIS — K589 Irritable bowel syndrome without diarrhea: Secondary | ICD-10-CM | POA: Insufficient documentation

## 2024-05-17 DIAGNOSIS — D071 Carcinoma in situ of vulva: Secondary | ICD-10-CM | POA: Insufficient documentation

## 2024-05-17 DIAGNOSIS — M1990 Unspecified osteoarthritis, unspecified site: Secondary | ICD-10-CM | POA: Diagnosis not present

## 2024-05-17 DIAGNOSIS — Z6841 Body Mass Index (BMI) 40.0 and over, adult: Secondary | ICD-10-CM | POA: Diagnosis not present

## 2024-05-17 DIAGNOSIS — E669 Obesity, unspecified: Secondary | ICD-10-CM | POA: Insufficient documentation

## 2024-05-17 DIAGNOSIS — L900 Lichen sclerosus et atrophicus: Secondary | ICD-10-CM

## 2024-05-17 DIAGNOSIS — Z79899 Other long term (current) drug therapy: Secondary | ICD-10-CM | POA: Diagnosis not present

## 2024-05-17 DIAGNOSIS — E785 Hyperlipidemia, unspecified: Secondary | ICD-10-CM | POA: Insufficient documentation

## 2024-05-17 DIAGNOSIS — K219 Gastro-esophageal reflux disease without esophagitis: Secondary | ICD-10-CM | POA: Insufficient documentation

## 2024-05-17 DIAGNOSIS — Z803 Family history of malignant neoplasm of breast: Secondary | ICD-10-CM | POA: Diagnosis not present

## 2024-05-17 MED ORDER — SENNOSIDES-DOCUSATE SODIUM 8.6-50 MG PO TABS
2.0000 | ORAL_TABLET | Freq: Every day | ORAL | 0 refills | Status: DC
Start: 2024-05-17 — End: 2024-06-17

## 2024-05-17 MED ORDER — OXYCODONE HCL 5 MG PO TABS
5.0000 mg | ORAL_TABLET | ORAL | 0 refills | Status: DC | PRN
Start: 2024-05-17 — End: 2024-06-17

## 2024-05-17 NOTE — H&P (View-Only) (Signed)
 GYNECOLOGIC ONCOLOGY NEW PATIENT CONSULTATION  Date of Service: 05/17/2024 Referring Provider: Bobie Cathlyn Cary, MD   ASSESSMENT AND PLAN: Tiffany Norton is a 68 y.o. woman with vulvar dysplasia and extensive lichen sclerosus.  We reviewed the nature of vulvar dysplasia. Surgical excision is the mainstay of treatment, but ablative therapy or pharmacologic treatment is an option for some patients in certain clinical scenarios. The goals of treatment of vulvar dysplasia are to prevent development of vulvar squamous carcinoma and relieve any associated symptoms, such as pain or itching. The goal is also preserve vulvar anatomy as best as possible.  Excision provides both treatment and a diagnostic specimen for those women with high-grade dysplasia. Invasive squamous cell carcinoma is present at the time of excision in 10-22% of women with VIN on initial biopsy.    Given patient's extensive lichen sclerosus and the small nature of her lesions on the perineal body near the posterior fourchette, I recommend laser ablation given concern for poor wound healing in the setting of her extensive lichen sclerosus and location of the lesion.  Could consider additional biopsy of the right sided portion of the lesion at the time of the procedure to aid in ruling out any invasive component.  Reviewed with patient that this is not definitive compared to complete excision, but based on exam do not have active concern for invasion at this time.  Patient was consented for: Pelvic exam under anesthesia, CO2 laser ablation of the vulva, possible vulvar biopsy on 06/08/24.  The risks of surgery were discussed in detail and she understands these to including but not limited to bleeding requiring a blood transfusion, infection, injury to adjacent organs (including but not limited to the bowels, bladder, ureters, nerves, blood vessels), wound separation, unforseen complication, and possible need for  re-exploration.  If the patient experiences any of these events, she understands that her hospitalization or recovery may be prolonged and that she may need to take additional medications for a prolonged period. The patient will receive DVT and antibiotic prophylaxis as indicated. She voiced a clear understanding. She had the opportunity to ask questions and informed consent was obtained today. She wishes to proceed.  She does not require preoperative clearance. Her METs are >4.  All preoperative instructions were reviewed. Postoperative expectations were also reviewed. Written handouts were provided to the patient.  Additionally recommend that patient follow-up with her dermatologist, Tiffany Clause, PA-C, at [facility] dermatology given the extensive lichen sclerosis to involve also the skin of her bilateral buttocks.  Additionally she has skin changes underlying her pannus that likely warrant intervention.  In the meantime, patient instructed to continue with her steroid cream.  A copy of this note was sent to the patient's referring provider.  Tiffany Masters, MD Gynecologic Oncology   Medical Decision Making I personally spent  TOTAL 60 minutes face-to-face and non-face-to-face in the care of this patient, which includes all pre, intra, and post visit time on the date of service.   ------------  CC: vulvar dysplasia  HISTORY OF PRESENT ILLNESS:  Tiffany Norton is a 68 y.o. woman who is seen in consultation at the request of Tiffany Cathlyn Cary, MD for evaluation of vulvar dysplasia.  Patient was seen by GYN on 02/16/2024 for note of skin nonirritated around buttocks and vagina.  This was reported for about 6 weeks.  She previously had a vulvar biopsy in 10/30/2021 that noted lichenoid dermatitis with possible early lichen sclerosis.  She had previously received a prescription for  clobetasol  but had stopped using this due to burning and itching.  On exam she was noted to have  erythema and pale coloration of the vulva with macerated tissue in the right groin.  Given concern for possible yeast a prescription for Diflucan  was sent.  She was also given a prescription for estradiol  cream.  She returned on 03/22/2024.  On exam at that time she was noted to have thickening and white change of skin of the perineum.  She was given a prescription for Lotrisone  to use twice daily for 2 to 3 weeks and continue vaginal estrogen cream.  At follow-up on 05/10/2024 she underwent vulvar biopsy with a punch biopsy of the left perianal and the left perineal areas.  The left perianal biopsy returned with lichen sclerosis.  The left perineal biopsy returned with squamous cell carcinoma in situ  Today pt endorses the history as above. She has tolerated the lotrisone .  Still notes itching and irritation that has not improved.  Denies any bleeding.  Has not noted in the bumps on her vulva.   PAST MEDICAL HISTORY: Past Medical History:  Diagnosis Date   Acid reflux    Arthritis    Back pain    Bilateral swelling of feet    CIN I (cervical intraepithelial neoplasia I)    Constipation    Depression    Elevated cholesterol    Hyperlipidemia    Hypertension    IBS (irritable bowel syndrome)    Joint pain    Knee pain    Obesity    Plantar fasciitis    PMDD (premenstrual dysphoric disorder)    Prediabetes    Serum calcium elevated    Normal PTH    PAST SURGICAL HISTORY: Past Surgical History:  Procedure Laterality Date   ANAL FISSURE REPAIR     ANKLE SURGERY Right    Arm Surg Right    CERVICAL BIOPSY  W/ LOOP ELECTRODE EXCISION  08/19/2004   CIN l   CESAREAN SECTION     COLPOSCOPY     PARATHYROID  EXPLORATION N/A 05/15/2023   Procedure: RIGHT INFERIOR PARATHYROIDECTOMY;  Surgeon: Tiffany Herlene Righter, MD;  Location: [facility];  Service: General;  Laterality: N/A;   TUBAL LIGATION Bilateral     OB/GYN HISTORY: OB History  Gravida Para Term Preterm AB Living  1 1 1   1   SAB IAB  Ectopic Multiple Live Births      1    # Outcome Date GA Lbr Len/2nd Weight Sex Type Anes PTL Lv  1 Term      CS-Unspec   LIV      Age at menarche: 33 Age at menopause: 54 Hx of HRT: vaginal estrogen Hx of STI: no Last pap: 09/19/2020 NILM, HPV HR neg History of abnormal pap smears: LEEP 2006, normal since  SCREENING STUDIES:  Last mammogram: 2024 Last colonoscopy: 2024, 5 year follow-up  MEDICATIONS:  Current Outpatient Medications:    oxyCODONE  (OXY IR/ROXICODONE ) 5 MG immediate release tablet, Take 1 tablet (5 mg total) by mouth every 4 (four) hours as needed for severe pain (pain score 7-10). For AFTER surgery only, do not take and drive, Disp: 10 tablet, Rfl: 0   senna-docusate (SENOKOT-S) 8.6-50 MG tablet, Take 2 tablets by mouth at bedtime. For AFTER surgery, do not take if having diarrhea, Disp: 30 tablet, Rfl: 0   clotrimazole -betamethasone  (LOTRISONE ) cream, Apply 1 Application topically 2 (two) times daily. Use twice a day for 2 - 3 weeks.  Then  use twice a week at bed time., Disp: 30 g, Rfl: 1   EPINEPHrine  0.3 mg/0.3 mL IJ SOAJ injection, Inject 0.3 mg into the muscle as needed for anaphylaxis., Disp: 1 each, Rfl: 1   estradiol  (ESTRACE ) 0.1 MG/GM vaginal cream, Use 1/2 g vaginally every night for the first 2 weeks, then use 1/2 g vaginally two or three times per week as needed to maintain symptom relief., Disp: 42.5 g, Rfl: 1   fluticasone  (FLONASE ) 50 MCG/ACT nasal spray, Place 1 spray into both nostrils daily. (Patient taking differently: Place 1 spray into both nostrils daily as needed for allergies.), Disp: 16 g, Rfl: 2   nystatin -triamcinolone  (MYCOLOG II) cream, Apply topically 2 (two) times daily as needed., Disp: , Rfl:    omeprazole (PRILOSEC) 20 MG capsule, Take 20 mg by mouth daily., Disp: , Rfl:    telmisartan  (MICARDIS ) 20 MG tablet, Take 0.5 tablets (10 mg total) by mouth daily., Disp: 45 tablet, Rfl: 0   venlafaxine  (EFFEXOR ) 75 MG tablet, Take 75 mg by mouth  daily., Disp: , Rfl:   ALLERGIES: Allergies  Allergen Reactions   Etodolac Hives and Other (See Comments)   Nsaids Other (See Comments)   Aspirin Hives and Other (See Comments)   Iodine     Other Reaction(s): Unknown    FAMILY HISTORY: Family History  Problem Relation Age of Onset   Hypertension Mother    Colon cancer Mother        late 60s/early 50s   Depression Mother    Obesity Mother    Aortic aneurysm Father    Hyperlipidemia Father    Sudden death Father    Breast cancer Niece    Endometrial cancer Neg Hx    Ovarian cancer Neg Hx     SOCIAL HISTORY: Social History   Socioeconomic History   Marital status: Married    Spouse name: Not on file   Number of children: Not on file   Years of education: Not on file   Highest education level: Not on file  Occupational History   Not on file  Tobacco Use   Smoking status: Former    Current packs/day: 0.00    Types: Cigarettes    Quit date: 2015    Years since quitting: 10.7    Passive exposure: Never   Smokeless tobacco: Never  Vaping Use   Vaping status: Never Used  Substance and Sexual Activity   Alcohol use: Yes    Alcohol/week: 2.0 standard drinks of alcohol    Types: 2 Glasses of wine per week    Comment: weekly   Drug use: No   Sexual activity: Yes    Partners: Male    Birth control/protection: Surgical    Comment: intercourse early 20's, less than 5 sexual partners  Other Topics Concern   Not on file  Social History Narrative   Not on file   Social Drivers of Health   Financial Resource Strain: Not on file  Food Insecurity: No Food Insecurity (05/14/2024)   Hunger Vital Sign    Worried About Running Out of Food in the Last Year: Never true    Ran Out of Food in the Last Year: Never true  Transportation Needs: No Transportation Needs (05/14/2024)   PRAPARE - Administrator, Civil Service (Medical): No    Lack of Transportation (Non-Medical): No  Physical Activity: Not on file   Stress: Not on file  Social Connections: Not on file  Intimate Partner Violence:  Not At Risk (05/14/2024)   Humiliation, Afraid, Rape, and Kick questionnaire    Fear of Current or Ex-Partner: No    Emotionally Abused: No    Physically Abused: No    Sexually Abused: No    REVIEW OF SYSTEMS: New patient intake form was reviewed.  Complete 10-system review is negative except for the following: Rash, anxiety, depression  PHYSICAL EXAM: BP 137/72 (BP Location: Left Arm, Patient Position: Sitting)   Pulse 97   Temp 98.4 F (36.9 C) (Oral)   Resp 20   Ht 5' 5 (1.651 m)   Wt 258 lb 6.4 oz (117.2 kg)   SpO2 99%   BMI 43.00 kg/m  Constitutional: No acute distress. Neuro/Psych: Alert, oriented.  Head and Neck: Normocephalic, atraumatic. Neck symmetric without masses. Sclera anicteric.  Respiratory: Normal work of breathing. Clear to auscultation bilaterally. Cardiovascular: Regular rate and rhythm, no murmurs, rubs, or gallops. Abdomen: Normoactive bowel sounds. Soft, non-distended, non-tender to palpation. No masses appreciated. Well healed c/s incision. Extremities: Grossly normal range of motion. Warm, well perfused. No edema bilaterally. Skin: Erythema underlying pannus particularly on left Lymphatic: No cervical, supraclavicular, or inguinal adenopathy. Genitourinary: External genitalia with broad erythema and whitening of skin with breakdown that particular extends posteriorly into involve the bilateral buttocks consistent with lichen sclerosis.  Biopsy site on left buttocks with suture in place and at left perineal body with suture in place. Urethral meatus without lesions or prolapse. On speculum exam, vagina and cervix without lesions. Bimanual exam reveals normal cervix. Uterus and adnexal difficult to palpate due to body habitus.  Exam chaperoned by Eleanor Epps, NP  VULVAR COLPOSCOPY PROCEDURE NOTE  Procedure Details: After appropriate verbal informed consent was obtained, a  timeout was performed. Acetic acid was applied to the vulva and the vulva was inspected with the colposcope with the findings as noted below. The vulva was then cleansed of the acetic acid with water. The patient tolerated the procedure well.   Adequate Exam: yes  Biopsy Specimen: no  Condition: Stable. Patient tolerated procedure well.  Complications: None  Findings: Acetowhite changes of the perineal body near the posterior fourchette with a subcentimeter lesion on the left at prior biopsy site and a subcentimeter lesion on the right.  Colposcopic Impression: VIN3   LABORATORY AND RADIOLOGIC DATA: Outside medical records were reviewed to synthesize the above history, along with the history and physical obtained during the visit.  Outside laboratory, pathology reports were reviewed, with pertinent results below.    WBC  Date Value Ref Range Status  05/16/2023 12.9 (H) 4.0 - 10.5 K/uL Final   Hemoglobin  Date Value Ref Range Status  05/16/2023 13.1 12.0 - 15.0 g/dL Final  94/80/7976 86.3 11.1 - 15.9 g/dL Final   HCT  Date Value Ref Range Status  05/16/2023 41.6 36.0 - 46.0 % Final   Hematocrit  Date Value Ref Range Status  01/04/2022 40.4 34.0 - 46.6 % Final   Platelets  Date Value Ref Range Status  05/16/2023 245 150 - 400 K/uL Final  09/08/2019 320 150 - 450 x10E3/uL Final   Creatinine, Ser  Date Value Ref Range Status  05/16/2023 0.94 0.44 - 1.00 mg/dL Final   AST  Date Value Ref Range Status  01/04/2022 18 0 - 40 IU/L Final   ALT  Date Value Ref Range Status  01/04/2022 26 0 - 32 IU/L Final   Diagnosis  Date Value Ref Range Status  09/19/2020   Final   - Negative  for Intraepithelial Lesions or Malignancy (NILM)  09/19/2020 - Benign reactive/reparative changes  Final    Surgical pathology (05/10/24): A. PERIANAL, LEFT, BIOPSY:       Lichen sclerosus.   B. PERINEAL, LEFT, BIOPSY:  Squamous cell carcinoma in situ.  See comment.   COMMENT:    Immunohistochemical stains were performed to characterize the lesion in  part B. P16 shows block like pattern of staining. P53 shows patchy basal  and parabasal staining. Ki 67 shows increase proliferation index in the  full thickness of the epithelium (5%). The overall findings are in  keeping with squamous cell carcinoma in situ.

## 2024-05-17 NOTE — Progress Notes (Signed)
 GYNECOLOGIC ONCOLOGY NEW PATIENT CONSULTATION  Date of Service: 05/17/2024 Referring Provider: Bobie Cathlyn Cary, MD   ASSESSMENT AND PLAN: Tiffany Norton is a 68 y.o. woman with vulvar dysplasia and extensive lichen sclerosus.  We reviewed the nature of vulvar dysplasia. Surgical excision is the mainstay of treatment, but ablative therapy or pharmacologic treatment is an option for some patients in certain clinical scenarios. The goals of treatment of vulvar dysplasia are to prevent development of vulvar squamous carcinoma and relieve any associated symptoms, such as pain or itching. The goal is also preserve vulvar anatomy as best as possible.  Excision provides both treatment and a diagnostic specimen for those women with high-grade dysplasia. Invasive squamous cell carcinoma is present at the time of excision in 10-22% of women with VIN on initial biopsy.    Given patient's extensive lichen sclerosus and the small nature of her lesions on the perineal body near the posterior fourchette, I recommend laser ablation given concern for poor wound healing in the setting of her extensive lichen sclerosus and location of the lesion.  Could consider additional biopsy of the right sided portion of the lesion at the time of the procedure to aid in ruling out any invasive component.  Reviewed with patient that this is not definitive compared to complete excision, but based on exam do not have active concern for invasion at this time.  Patient was consented for: Pelvic exam under anesthesia, CO2 laser ablation of the vulva, possible vulvar biopsy on 06/08/24.  The risks of surgery were discussed in detail and she understands these to including but not limited to bleeding requiring a blood transfusion, infection, injury to adjacent organs (including but not limited to the bowels, bladder, ureters, nerves, blood vessels), wound separation, unforseen complication, and possible need for  re-exploration.  If the patient experiences any of these events, she understands that her hospitalization or recovery may be prolonged and that she may need to take additional medications for a prolonged period. The patient will receive DVT and antibiotic prophylaxis as indicated. She voiced a clear understanding. She had the opportunity to ask questions and informed consent was obtained today. She wishes to proceed.  She does not require preoperative clearance. Her METs are >4.  All preoperative instructions were reviewed. Postoperative expectations were also reviewed. Written handouts were provided to the patient.  Additionally recommend that patient follow-up with her dermatologist, Andrea Clause, PA-C, at [facility] dermatology given the extensive lichen sclerosis to involve also the skin of her bilateral buttocks.  Additionally she has skin changes underlying her pannus that likely warrant intervention.  In the meantime, patient instructed to continue with her steroid cream.  A copy of this note was sent to the patient's referring provider.  [name], MD Gynecologic Oncology   Medical Decision Making I personally spent  TOTAL 60 minutes face-to-face and non-face-to-face in the care of this patient, which includes all pre, intra, and post visit time on the date of service.   ------------  CC: vulvar dysplasia  HISTORY OF PRESENT ILLNESS:  Tiffany Norton is a 68 y.o. woman who is seen in consultation at the request of Bobie Cathlyn Cary, MD for evaluation of vulvar dysplasia.  Patient was seen by GYN on 02/16/2024 for note of skin nonirritated around buttocks and vagina.  This was reported for about 6 weeks.  She previously had a vulvar biopsy in 10/30/2021 that noted lichenoid dermatitis with possible early lichen sclerosis.  She had previously received a prescription for  clobetasol  but had stopped using this due to burning and itching.  On exam she was noted to have  erythema and pale coloration of the vulva with macerated tissue in the right groin.  Given concern for possible yeast a prescription for Diflucan  was sent.  She was also given a prescription for estradiol  cream.  She returned on 03/22/2024.  On exam at that time she was noted to have thickening and white change of skin of the perineum.  She was given a prescription for Lotrisone  to use twice daily for 2 to 3 weeks and continue vaginal estrogen cream.  At follow-up on 05/10/2024 she underwent vulvar biopsy with a punch biopsy of the left perianal and the left perineal areas.  The left perianal biopsy returned with lichen sclerosis.  The left perineal biopsy returned with squamous cell carcinoma in situ  Today pt endorses the history as above. She has tolerated the lotrisone .  Still notes itching and irritation that has not improved.  Denies any bleeding.  Has not noted in the bumps on her vulva.   PAST MEDICAL HISTORY: Past Medical History:  Diagnosis Date   Acid reflux    Arthritis    Back pain    Bilateral swelling of feet    CIN I (cervical intraepithelial neoplasia I)    Constipation    Depression    Elevated cholesterol    Hyperlipidemia    Hypertension    IBS (irritable bowel syndrome)    Joint pain    Knee pain    Obesity    Plantar fasciitis    PMDD (premenstrual dysphoric disorder)    Prediabetes    Serum calcium elevated    Normal PTH    PAST SURGICAL HISTORY: Past Surgical History:  Procedure Laterality Date   ANAL FISSURE REPAIR     ANKLE SURGERY Right    Arm Surg Right    CERVICAL BIOPSY  W/ LOOP ELECTRODE EXCISION  08/19/2004   CIN l   CESAREAN SECTION     COLPOSCOPY     PARATHYROID  EXPLORATION N/A 05/15/2023   Procedure: RIGHT INFERIOR PARATHYROIDECTOMY;  Surgeon: Stevie Herlene Righter, MD;  Location: [facility];  Service: General;  Laterality: N/A;   TUBAL LIGATION Bilateral     OB/GYN HISTORY: OB History  Gravida Para Term Preterm AB Living  1 1 1   1   SAB IAB  Ectopic Multiple Live Births      1    # Outcome Date GA Lbr Len/2nd Weight Sex Type Anes PTL Lv  1 Term      CS-Unspec   LIV      Age at menarche: 33 Age at menopause: 54 Hx of HRT: vaginal estrogen Hx of STI: no Last pap: 09/19/2020 NILM, HPV HR neg History of abnormal pap smears: LEEP 2006, normal since  SCREENING STUDIES:  Last mammogram: 2024 Last colonoscopy: 2024, 5 year follow-up  MEDICATIONS:  Current Outpatient Medications:    oxyCODONE  (OXY IR/ROXICODONE ) 5 MG immediate release tablet, Take 1 tablet (5 mg total) by mouth every 4 (four) hours as needed for severe pain (pain score 7-10). For AFTER surgery only, do not take and drive, Disp: 10 tablet, Rfl: 0   senna-docusate (SENOKOT-S) 8.6-50 MG tablet, Take 2 tablets by mouth at bedtime. For AFTER surgery, do not take if having diarrhea, Disp: 30 tablet, Rfl: 0   clotrimazole -betamethasone  (LOTRISONE ) cream, Apply 1 Application topically 2 (two) times daily. Use twice a day for 2 - 3 weeks.  Then  use twice a week at bed time., Disp: 30 g, Rfl: 1   EPINEPHrine  0.3 mg/0.3 mL IJ SOAJ injection, Inject 0.3 mg into the muscle as needed for anaphylaxis., Disp: 1 each, Rfl: 1   estradiol  (ESTRACE ) 0.1 MG/GM vaginal cream, Use 1/2 g vaginally every night for the first 2 weeks, then use 1/2 g vaginally two or three times per week as needed to maintain symptom relief., Disp: 42.5 g, Rfl: 1   fluticasone  (FLONASE ) 50 MCG/ACT nasal spray, Place 1 spray into both nostrils daily. (Patient taking differently: Place 1 spray into both nostrils daily as needed for allergies.), Disp: 16 g, Rfl: 2   nystatin -triamcinolone  (MYCOLOG II) cream, Apply topically 2 (two) times daily as needed., Disp: , Rfl:    omeprazole (PRILOSEC) 20 MG capsule, Take 20 mg by mouth daily., Disp: , Rfl:    telmisartan  (MICARDIS ) 20 MG tablet, Take 0.5 tablets (10 mg total) by mouth daily., Disp: 45 tablet, Rfl: 0   venlafaxine  (EFFEXOR ) 75 MG tablet, Take 75 mg by mouth  daily., Disp: , Rfl:   ALLERGIES: Allergies  Allergen Reactions   Etodolac Hives and Other (See Comments)   Nsaids Other (See Comments)   Aspirin Hives and Other (See Comments)   Iodine     Other Reaction(s): Unknown    FAMILY HISTORY: Family History  Problem Relation Age of Onset   Hypertension Mother    Colon cancer Mother        late 60s/early 50s   Depression Mother    Obesity Mother    Aortic aneurysm Father    Hyperlipidemia Father    Sudden death Father    Breast cancer Niece    Endometrial cancer Neg Hx    Ovarian cancer Neg Hx     SOCIAL HISTORY: Social History   Socioeconomic History   Marital status: Married    Spouse name: Not on file   Number of children: Not on file   Years of education: Not on file   Highest education level: Not on file  Occupational History   Not on file  Tobacco Use   Smoking status: Former    Current packs/day: 0.00    Types: Cigarettes    Quit date: 2015    Years since quitting: 10.7    Passive exposure: Never   Smokeless tobacco: Never  Vaping Use   Vaping status: Never Used  Substance and Sexual Activity   Alcohol use: Yes    Alcohol/week: 2.0 standard drinks of alcohol    Types: 2 Glasses of wine per week    Comment: weekly   Drug use: No   Sexual activity: Yes    Partners: Male    Birth control/protection: Surgical    Comment: intercourse early 20's, less than 5 sexual partners  Other Topics Concern   Not on file  Social History Narrative   Not on file   Social Drivers of Health   Financial Resource Strain: Not on file  Food Insecurity: No Food Insecurity (05/14/2024)   Hunger Vital Sign    Worried About Running Out of Food in the Last Year: Never true    Ran Out of Food in the Last Year: Never true  Transportation Needs: No Transportation Needs (05/14/2024)   PRAPARE - Administrator, Civil Service (Medical): No    Lack of Transportation (Non-Medical): No  Physical Activity: Not on file   Stress: Not on file  Social Connections: Not on file  Intimate Partner Violence:  Not At Risk (05/14/2024)   Humiliation, Afraid, Rape, and Kick questionnaire    Fear of Current or Ex-Partner: No    Emotionally Abused: No    Physically Abused: No    Sexually Abused: No    REVIEW OF SYSTEMS: New patient intake form was reviewed.  Complete 10-system review is negative except for the following: Rash, anxiety, depression  PHYSICAL EXAM: BP 137/72 (BP Location: Left Arm, Patient Position: Sitting)   Pulse 97   Temp 98.4 F (36.9 C) (Oral)   Resp 20   Ht 5' 5 (1.651 m)   Wt 258 lb 6.4 oz (117.2 kg)   SpO2 99%   BMI 43.00 kg/m  Constitutional: No acute distress. Neuro/Psych: Alert, oriented.  Head and Neck: Normocephalic, atraumatic. Neck symmetric without masses. Sclera anicteric.  Respiratory: Normal work of breathing. Clear to auscultation bilaterally. Cardiovascular: Regular rate and rhythm, no murmurs, rubs, or gallops. Abdomen: Normoactive bowel sounds. Soft, non-distended, non-tender to palpation. No masses appreciated. Well healed c/s incision. Extremities: Grossly normal range of motion. Warm, well perfused. No edema bilaterally. Skin: Erythema underlying pannus particularly on left Lymphatic: No cervical, supraclavicular, or inguinal adenopathy. Genitourinary: External genitalia with broad erythema and whitening of skin with breakdown that particular extends posteriorly into involve the bilateral buttocks consistent with lichen sclerosis.  Biopsy site on left buttocks with suture in place and at left perineal body with suture in place. Urethral meatus without lesions or prolapse. On speculum exam, vagina and cervix without lesions. Bimanual exam reveals normal cervix. Uterus and adnexal difficult to palpate due to body habitus.  Exam chaperoned by Eleanor Epps, NP  VULVAR COLPOSCOPY PROCEDURE NOTE  Procedure Details: After appropriate verbal informed consent was obtained, a  timeout was performed. Acetic acid was applied to the vulva and the vulva was inspected with the colposcope with the findings as noted below. The vulva was then cleansed of the acetic acid with water. The patient tolerated the procedure well.   Adequate Exam: yes  Biopsy Specimen: no  Condition: Stable. Patient tolerated procedure well.  Complications: None  Findings: Acetowhite changes of the perineal body near the posterior fourchette with a subcentimeter lesion on the left at prior biopsy site and a subcentimeter lesion on the right.  Colposcopic Impression: VIN3   LABORATORY AND RADIOLOGIC DATA: Outside medical records were reviewed to synthesize the above history, along with the history and physical obtained during the visit.  Outside laboratory, pathology reports were reviewed, with pertinent results below.    WBC  Date Value Ref Range Status  05/16/2023 12.9 (H) 4.0 - 10.5 K/uL Final   Hemoglobin  Date Value Ref Range Status  05/16/2023 13.1 12.0 - 15.0 g/dL Final  94/80/7976 86.3 11.1 - 15.9 g/dL Final   HCT  Date Value Ref Range Status  05/16/2023 41.6 36.0 - 46.0 % Final   Hematocrit  Date Value Ref Range Status  01/04/2022 40.4 34.0 - 46.6 % Final   Platelets  Date Value Ref Range Status  05/16/2023 245 150 - 400 K/uL Final  09/08/2019 320 150 - 450 x10E3/uL Final   Creatinine, Ser  Date Value Ref Range Status  05/16/2023 0.94 0.44 - 1.00 mg/dL Final   AST  Date Value Ref Range Status  01/04/2022 18 0 - 40 IU/L Final   ALT  Date Value Ref Range Status  01/04/2022 26 0 - 32 IU/L Final   Diagnosis  Date Value Ref Range Status  09/19/2020   Final   - Negative  for Intraepithelial Lesions or Malignancy (NILM)  09/19/2020 - Benign reactive/reparative changes  Final    Surgical pathology (05/10/24): A. PERIANAL, LEFT, BIOPSY:       Lichen sclerosus.   B. PERINEAL, LEFT, BIOPSY:  Squamous cell carcinoma in situ.  See comment.   COMMENT:    Immunohistochemical stains were performed to characterize the lesion in  part B. P16 shows block like pattern of staining. P53 shows patchy basal  and parabasal staining. Ki 67 shows increase proliferation index in the  full thickness of the epithelium (5%). The overall findings are in  keeping with squamous cell carcinoma in situ.

## 2024-05-17 NOTE — Patient Instructions (Signed)
 Continue the Lotrisone  cream until surgery. Recommend that you follow up with your dermatologist with [facility].  Preparing for your Surgery  Plan for surgery on 06/08/2024 with Dr. [name] at [facility]. You will be scheduled for pelvic examination under anesthesia, carbon dioxide vulvar laser, possible biopsies.   Pre-operative Testing -You will receive a phone call from presurgical testing at [facility] to discuss surgery instructions and arrange for lab work if needed.  -Bring your insurance card, copy of an advanced directive if applicable, medication list.  -You should not be taking blood thinners or aspirin at least ten days prior to surgery unless instructed by your surgeon.  -Do not take supplements such as fish oil (omega 3), red yeast rice, turmeric before your surgery. You want to avoid medications with aspirin in them including headache powders such as BC or Goody's), Excedrin migraine.  Day Before Surgery at Home -You will be advised you can have clear liquids up until 3 hours before your surgery.    Your role in recovery Your role is to become active as soon as directed by your doctor, while still giving yourself time to heal.  Rest when you feel tired. You will be asked to do the following in order to speed your recovery:  - Cough and breathe deeply. This helps to clear and expand your lungs and can prevent pneumonia after surgery.  - STAY ACTIVE WHEN YOU GET HOME. Do mild physical activity. Walking or moving your legs help your circulation and body functions return to normal. Do not try to get up or walk alone the first time after surgery.   -If you develop swelling on one leg or the other, pain in the back of your leg, redness/warmth in one of your legs, please call the office or go to the Emergency Room to have a doppler to rule out a blood clot. For shortness of breath, chest pain-seek care in the Emergency Room as soon as  possible. - Actively manage your pain. Managing your pain lets you move in comfort. We will ask you to rate your pain on a scale of zero to 10. It is your responsibility to tell your doctor or nurse where and how much you hurt so your pain can be treated.  Special Considerations -Your final pathology results from surgery should be available around one week after surgery and the results will be relayed to you when available.  -FMLA forms can be faxed to [phone] and please allow 5-7 business days for completion.  Pain Management After Surgery -You will be prescribed your pain medication and bowel regimen medications before surgery so that you can have these available when you are discharged from the hospital. The pain medication is for use ONLY AFTER surgery and a new prescription will not be given.   -Make sure that you have Tylenol  at home IF YOU ARE ABLE TO TAKE THESE MEDICATION to use on a regular basis after surgery for pain control.   -Review the attached handout on narcotic use and their risks and side effects.   Bowel Regimen -You will be prescribed Sennakot-S to take nightly to prevent constipation especially if you are taking the narcotic pain medication intermittently.  It is important to prevent constipation and drink adequate amounts of liquids. You can stop taking this medication when you are not taking pain medication and you are back on your normal bowel routine.  Risks of Surgery Risks of surgery are low but include bleeding,  infection, damage to surrounding structures, re-operation, blood clots, and very rarely death.  AFTER SURGERY INSTRUCTIONS  Return to work:  variable based on occupation  We recommend purchasing several bags of frozen green peas and dividing them into ziploc bags. You will want to keep these in the freezer and have them ready to use as ice packs to the vulvar incision. Once the ice pack is no longer cold, you can get another from the freezer. The  frozen peas mold to your body better than a regular ice pack.  -You have been given a donut pillow, peri bottle, and topical lidocaine  to use after surgery. The lidocaine  is as needed application to the lasered area for discomfort up to 3 times daily.   -You will also be given a cream from the hospital after surgery called silvadene that you can apply to the lasered areas at least twice daily to assist with healing.  Activity: 1. Be up and out of the bed during the day.  Take a nap if needed.  You may walk up steps but be careful and use the hand rail.  Stair climbing will tire you more than you think, you may need to stop part way and rest.   2. No lifting or straining for 1 week over 10 pounds. No pushing, pulling, straining for 1 week.  3. No driving for minimum 24 hours after surgery but this is typically longer until the following criteria have been met: Do not drive if you are taking narcotic pain medicine and make sure that your reaction time has returned.   4. You can shower as soon as the next day after surgery. Shower daily. No tub baths or submerging your body in water until cleared by your surgeon. If you have the soap that was given to you by pre-surgical testing that was used before surgery, you do not need to use it afterwards because this can irritate your incisions.   5. No sexual activity and nothing in the vagina for 4-6 weeks.  6. You may experience vulvar spotting and discharge after surgery.  The spotting is normal but if you experience heavy bleeding, call our office.  7. Take Tylenol  first for pain if you are able to take these medication and only use narcotic pain medication for severe pain not relieved by the Tylenol .  Monitor your Tylenol  intake to a max of 4,000 mg in a 24 hour period.  Diet: 1. Low sodium Heart Healthy Diet is recommended but you are cleared to resume your normal (before surgery) diet after your procedure.  2. It is safe to use a laxative, such as  Miralax or Colace, if you have difficulty moving your bowels. You have been prescribed Sennakot at bedtime every evening to keep bowel movements regular and to prevent constipation.    Wound Care: 1. Keep clean and dry.  Shower daily.  Reasons to call the Doctor: Fever - Oral temperature greater than 100.4 degrees Fahrenheit Foul-smelling vaginal discharge Difficulty urinating Nausea and vomiting Increased pain at the site of the incision that is unrelieved with pain medicine. Difficulty breathing with or without chest pain New calf pain especially if only on one side Sudden, continuing increased vaginal bleeding with or without clots.   Contacts: For questions or concerns you should contact:  Dr. [name] at [phone]  Eleanor Epps, NP at [phone]  After Hours: call [phone] and have the GYN Oncologist paged/contacted (after 5 pm or on the weekends).  Messages sent via  mychart are for non-urgent matters and are not responded to after hours so for urgent needs, please call the after hours number.

## 2024-05-31 NOTE — Progress Notes (Signed)
 Patient here for new patient consultation with Dr. Eldonna and for a pre-operative appointment prior to her scheduled surgery on 06/08/2024. She is scheduled for pelvic examination under anesthesia, carbon dioxide vulvar laser, possible biopsies.   Discussed post-op pain management in detail including the aspects of the enhanced recovery pathway.  Advised her that a new prescription would be sent in for oxycodone  and it is only to be used for after her upcoming surgery.  We discussed the use of tylenol  post-op and to monitor for a maximum of 4,000 mg in a 24 hour period.  Also prescribed sennakot to be used after surgery and to hold if having loose stools.  Discussed bowel regimen in detail.     Discussed measures to take at home to prevent DVT including frequent mobility.  Reportable signs and symptoms of DVT discussed. Post-operative instructions discussed and expectations for after surgery. Incisional care discussed as well including reportable signs and symptoms including erythema, drainage, wound separation.     10 minutes spent preparing information and with the patient.  Verbalizing understanding of material discussed. No needs or concerns voiced at the end of the visit.   Advised patient to call for any needs.  Advised that her post-operative medications had been prescribed and could be picked up at any time.    This appointment is included in the global surgical bundle as pre-operative teaching and has no charge.

## 2024-06-01 NOTE — Patient Instructions (Signed)
 SURGICAL WAITING ROOM VISITATION  Patients having surgery or a procedure may have no more than 2 support people in the waiting area - these visitors may rotate.    Children under the age of 72 must have an adult with them who is not the patient.  Visitors with respiratory illnesses are discouraged from visiting and should remain at home.  If the patient needs to stay at the hospital during part of their recovery, the visitor guidelines for inpatient rooms apply. Pre-op nurse will coordinate an appropriate time for 1 support person to accompany patient in pre-op.  This support person may not rotate.    Please refer to the [facility] website for the visitor guidelines for Inpatients (after your surgery is over and you are in a regular room).       Your procedure is scheduled on: 06/08/24   Report to [facility] Main Entrance    Report to admitting at 6:30 AM   Call this number if you have problems the morning of surgery [phone]   Do not eat food or drink liquids:After Midnight.but may have sips of liquids to take meds.       Oral Hygiene is also important to reduce your risk of infection.                                    Remember - BRUSH YOUR TEETH THE MORNING OF SURGERY WITH YOUR REGULAR TOOTHPASTE    Stop all vitamins and herbal supplements 7 days before surgery.   Take these medicines the morning of surgery with A SIP OF WATER: estradiol , nasal spray, omeprazole, oxycodone , effexor .             You may not have any metal on your body including hair pins, jewelry, and body piercing             Do not wear make-up, lotions, powders, perfumes/cologne, or deodorant  Do not wear nail polish including gel and S&S, artificial/acrylic nails, or any other type of covering on natural nails including finger and toenails. If you have artificial nails, gel coating, etc. that needs to be removed by a nail salon please have this removed prior to surgery or surgery may need  to be canceled/ delayed if the surgeon/ anesthesia feels like they are unable to be safely monitored.   Do not shave  48 hours prior to surgery.            Do not bring valuables to the hospital. [facility] IS NOT             RESPONSIBLE   FOR VALUABLES.   Contacts, glasses, dentures or bridgework may not be worn into surgery.   Bring small overnight bag day of surgery.   DO NOT BRING YOUR HOME MEDICATIONS TO THE HOSPITAL. PHARMACY WILL DISPENSE MEDICATIONS LISTED ON YOUR MEDICATION LIST TO YOU DURING YOUR ADMISSION IN THE HOSPITAL!    Patients discharged on the day of surgery will not be allowed to drive home.  Someone NEEDS to stay with you for the first 24 hours after anesthesia.   Special Instructions: Bring a copy of your healthcare power of attorney and living will documents the day of surgery if you haven't scanned them before.              Please read over the following fact sheets you were given: IF YOU HAVE QUESTIONS ABOUT  YOUR PRE-OP INSTRUCTIONS PLEASE CALL [phone] [place]   If you received a COVID test during your pre-op visit  it is requested that you wear a mask when out in public, stay away from anyone that may not be feeling well and notify your surgeon if you develop symptoms. If you test positive for Covid or have been in contact with anyone that has tested positive in the last 10 days please notify you surgeon.    [place] - Preparing for Surgery Before surgery, you can play an important role.  Because skin is not sterile, your skin needs to be as free of germs as possible.  You can reduce the number of germs on your skin by washing with CHG (chlorahexidine gluconate) soap before surgery.  CHG is an antiseptic cleaner which kills germs and bonds with the skin to continue killing germs even after washing. Please DO NOT use if you have an allergy  to CHG or antibacterial soaps.  If your skin becomes reddened/irritated stop using the CHG and inform your nurse when you  arrive at Short Stay. Do not shave (including legs and underarms) for at least 48 hours prior to the first CHG shower.  You may shave your face/neck.  Please follow these instructions carefully:  1.  Shower with CHG Soap the night before surgery and the morning of surgery.  2.  If you choose to wash your hair, wash your hair first as usual with your normal  shampoo.  3.  After you shampoo, rinse your hair and body thoroughly to remove the shampoo.                             4.  Use CHG as you would any other liquid soap.  You can apply chg directly to the skin and wash.  Gently with a scrungie or clean washcloth.  5.  Apply the CHG Soap to your body ONLY FROM THE NECK DOWN.   Do   not use on face/ open                           Wound or open sores. Avoid contact with eyes, ears mouth and   genitals (private parts).                       Wash face,  Genitals (private parts) with your normal soap.             6.  Wash thoroughly, paying special attention to the area where your    surgery  will be performed.  7.  Thoroughly rinse your body with warm water from the neck down.  8.  DO NOT shower/wash with your normal soap after using and rinsing off the CHG Soap.                9.  Pat yourself dry with a clean towel.            10.  Wear clean pajamas.            11.  Place clean sheets on your bed the night of your first shower and do not  sleep with pets. Day of Surgery : Do not apply any CHG, lotions/deodorants the morning of surgery.  Please wear clean clothes to the hospital/surgery center.  FAILURE TO FOLLOW THESE INSTRUCTIONS MAY RESULT IN THE CANCELLATION OF YOUR SURGERY  PATIENT SIGNATURE_________________________________  NURSE SIGNATURE__________________________________  ________________________________________________________________________

## 2024-06-01 NOTE — Progress Notes (Signed)
 COVID Vaccine received:  []  No [x]  Yes Date of any COVID positive Test in last 90 days: no PCP - Dr. Artist Cohens Cardiologist - n/a  Chest x-ray -  EKG -   Stress Test -  ECHO -  Cardiac Cath -   Bowel Prep - [x]  No  []   Yes ______  Pacemaker / ICD device [x]  No []  Yes   Spinal Cord Stimulator:[x]  No []  Yes       History of Sleep Apnea? [x]  No []  Yes   CPAP used?- [x]  No []  Yes    Does the patient monitor blood sugar?          [x]  No []  Yes  []  N/A  Patient has: [x]  NO Hx DM   []  Pre-DM                 []  DM1  []   DM2 Does patient have a [employer] or Dexacom? []  No []  Yes   Fasting Blood Sugar Ranges-  Checks Blood Sugar _____ times a day  GLP1 agonist / usual dose - no GLP1 instructions:  SGLT-2 inhibitors / usual dose - no SGLT-2 instructions:   Blood Thinner / Instructions:no Aspirin Instructions:no  Comments: Abnl. EKG, HTN  Activity level: Patient is able  to climb a flight of stairs without difficulty; [x]  No CP  [x]  No SOB, but would have knee pain___   Patient can /perform ADLs without assistance.   Anesthesia review:   Patient denies shortness of breath, fever, cough and chest pain at PAT appointment.  Patient verbalized understanding and agreement to the Pre-Surgical Instructions that were given to them at this PAT appointment. Patient was also educated of the need to review these PAT instructions again prior to his/her surgery.I reviewed the appropriate phone numbers to call if they have any and questions or concerns.

## 2024-06-03 ENCOUNTER — Encounter (HOSPITAL_COMMUNITY): Payer: Self-pay

## 2024-06-03 ENCOUNTER — Other Ambulatory Visit: Payer: Self-pay

## 2024-06-03 ENCOUNTER — Encounter (HOSPITAL_COMMUNITY)
Admission: RE | Admit: 2024-06-03 | Discharge: 2024-06-03 | Disposition: A | Discharge status: Home / Self Care | Source: Ambulatory Visit | Attending: Psychiatry | Admitting: Psychiatry

## 2024-06-03 VITALS — BP 147/90 | HR 87 | Temp 98.4°F | Resp 18 | Ht 65.0 in | Wt 261.0 lb

## 2024-06-03 DIAGNOSIS — I10 Essential (primary) hypertension: Secondary | ICD-10-CM | POA: Insufficient documentation

## 2024-06-03 DIAGNOSIS — Z01818 Encounter for other preprocedural examination: Secondary | ICD-10-CM | POA: Diagnosis not present

## 2024-06-03 LAB — BASIC METABOLIC PANEL WITH GFR
Anion gap: 11 (ref 5–15)
BUN: 19 mg/dL (ref 8–23)
CO2: 23 mmol/L (ref 22–32)
Calcium: 9.4 mg/dL (ref 8.9–10.3)
Chloride: 107 mmol/L (ref 98–111)
Creatinine, Ser: 0.94 mg/dL (ref 0.44–1.00)
GFR, Estimated: >60 mL/min (ref >60)
Glucose, Bld: 124 mg/dL — ABNORMAL HIGH (ref 70–99)
Potassium: 4 mmol/L (ref 3.5–5.1)
Sodium: 140 mmol/L (ref 135–145)

## 2024-06-03 LAB — CBC
HCT: 44.3 % (ref 36.0–46.0)
Hemoglobin: 14.4 g/dL (ref 12.0–15.0)
MCH: 29.1 pg (ref 26.0–34.0)
MCHC: 32.5 g/dL (ref 30.0–36.0)
MCV: 89.7 fL (ref 80.0–100.0)
Platelets: 228 K/uL (ref 150–400)
RBC: 4.94 MIL/uL (ref 3.87–5.11)
RDW: 13.8 % (ref 11.5–15.5)
WBC: 7.6 K/uL (ref 4.0–10.5)
nRBC: 0 % (ref 0.0–0.2)

## 2024-06-07 ENCOUNTER — Telehealth: Payer: Self-pay | Admitting: *Deleted

## 2024-06-07 NOTE — Discharge Instructions (Signed)
 AFTER SURGERY INSTRUCTIONS   Return to work:  variable based on occupation   We recommend purchasing several bags of frozen green peas and dividing them into ziploc bags. You will want to keep these in the freezer and have them ready to use as ice packs to the vulvar incision. Once the ice pack is no longer cold, you can get another from the freezer. The frozen peas mold to your body better than a regular ice pack.   -You have been given a donut pillow, peri bottle, and topical lidocaine  to use after surgery. The lidocaine  is as needed application to the lasered area for discomfort up to 3 times daily.    -You will also be given a cream from the hospital after surgery called silvadene that you can apply to the lasered areas at least twice daily to assist with healing.   Activity: 1. Be up and out of the bed during the day.  Take a nap if needed.  You may walk up steps but be careful and use the hand rail.  Stair climbing will tire you more than you think, you may need to stop part way and rest.    2. No lifting or straining for 1 week over 10 pounds. No pushing, pulling, straining for 1 week.   3. No driving for minimum 24 hours after surgery but this is typically longer until the following criteria have been met: Do not drive if you are taking narcotic pain medicine and make sure that your reaction time has returned.    4. You can shower as soon as the next day after surgery. Shower daily. No tub baths or submerging your body in water until cleared by your surgeon. If you have the soap that was given to you by pre-surgical testing that was used before surgery, you do not need to use it afterwards because this can irritate your incisions.    5. No sexual activity and nothing in the vagina for 4-6 weeks.   6. You may experience vulvar spotting and discharge after surgery.  The spotting is normal but if you experience heavy bleeding, call our office.   7. Take Tylenol  first for pain if you are  able to take these medication and only use narcotic pain medication for severe pain not relieved by the Tylenol .  Monitor your Tylenol  intake to a max of 4,000 mg in a 24 hour period.   Diet: 1. Low sodium Heart Healthy Diet is recommended but you are cleared to resume your normal (before surgery) diet after your procedure.   2. It is safe to use a laxative, such as Miralax or Colace, if you have difficulty moving your bowels. You have been prescribed Sennakot at bedtime every evening to keep bowel movements regular and to prevent constipation.     Wound Care: 1. Keep clean and dry.  Shower daily.   Reasons to call the Doctor: Fever - Oral temperature greater than 100.4 degrees Fahrenheit Foul-smelling vaginal discharge Difficulty urinating Nausea and vomiting Increased pain at the site of the incision that is unrelieved with pain medicine. Difficulty breathing with or without chest pain New calf pain especially if only on one side Sudden, continuing increased vaginal bleeding with or without clots.   Contacts: For questions or concerns you should contact:   Dr. [name] at [phone]   Eleanor Epps, NP at [phone]   After Hours: call [phone] and have the GYN Oncologist paged/contacted (after 5 pm or on the weekends).  Messages sent via mychart are for non-urgent matters and are not responded to after hours so for urgent needs, please call the after hours number.

## 2024-06-07 NOTE — Telephone Encounter (Signed)
Telephone call to check on pre-operative status.  Patient compliant with pre-operative instructions.  Reinforced nothing to eat after midnight. Clear liquids until 0530. Patient to arrive at 0630.  No questions or concerns voiced.  Instructed to call for any needs. 

## 2024-06-08 ENCOUNTER — Encounter (HOSPITAL_COMMUNITY)
Admission: RE | Disposition: A | Discharge status: Home / Self Care | Payer: Self-pay | Source: Home / Self Care | Attending: Psychiatry

## 2024-06-08 ENCOUNTER — Other Ambulatory Visit: Payer: Self-pay

## 2024-06-08 ENCOUNTER — Ambulatory Visit (HOSPITAL_COMMUNITY): Payer: Self-pay | Admitting: Physician Assistant

## 2024-06-08 ENCOUNTER — Ambulatory Visit (HOSPITAL_COMMUNITY)
Admission: RE | Admit: 2024-06-08 | Discharge: 2024-06-08 | Disposition: A | Discharge status: Home / Self Care | Attending: Psychiatry | Admitting: Psychiatry

## 2024-06-08 ENCOUNTER — Encounter (HOSPITAL_COMMUNITY): Payer: Self-pay | Admitting: Psychiatry

## 2024-06-08 ENCOUNTER — Ambulatory Visit (HOSPITAL_COMMUNITY): Admitting: Certified Registered Nurse Anesthetist

## 2024-06-08 DIAGNOSIS — M1990 Unspecified osteoarthritis, unspecified site: Secondary | ICD-10-CM | POA: Insufficient documentation

## 2024-06-08 DIAGNOSIS — F32A Depression, unspecified: Secondary | ICD-10-CM | POA: Insufficient documentation

## 2024-06-08 DIAGNOSIS — E66813 Obesity, class 3: Secondary | ICD-10-CM

## 2024-06-08 DIAGNOSIS — I10 Essential (primary) hypertension: Secondary | ICD-10-CM | POA: Diagnosis not present

## 2024-06-08 DIAGNOSIS — E213 Hyperparathyroidism, unspecified: Secondary | ICD-10-CM | POA: Insufficient documentation

## 2024-06-08 DIAGNOSIS — Z87891 Personal history of nicotine dependence: Secondary | ICD-10-CM | POA: Diagnosis not present

## 2024-06-08 DIAGNOSIS — K219 Gastro-esophageal reflux disease without esophagitis: Secondary | ICD-10-CM | POA: Insufficient documentation

## 2024-06-08 DIAGNOSIS — Z6841 Body Mass Index (BMI) 40.0 and over, adult: Secondary | ICD-10-CM | POA: Insufficient documentation

## 2024-06-08 DIAGNOSIS — E785 Hyperlipidemia, unspecified: Secondary | ICD-10-CM | POA: Diagnosis not present

## 2024-06-08 DIAGNOSIS — N903 Dysplasia of vulva, unspecified: Secondary | ICD-10-CM

## 2024-06-08 DIAGNOSIS — D071 Carcinoma in situ of vulva: Secondary | ICD-10-CM | POA: Insufficient documentation

## 2024-06-08 DIAGNOSIS — K589 Irritable bowel syndrome without diarrhea: Secondary | ICD-10-CM | POA: Diagnosis not present

## 2024-06-08 DIAGNOSIS — L900 Lichen sclerosus et atrophicus: Secondary | ICD-10-CM | POA: Diagnosis not present

## 2024-06-08 HISTORY — PX: VULVA /PERINEUM BIOPSY: SHX319

## 2024-06-08 HISTORY — PX: CO2 LASER APPLICATION: SHX5778

## 2024-06-08 HISTORY — PX: LESION DESTRUCTION: SHX5132

## 2024-06-08 HISTORY — DX: Carcinoma in situ of vulva: D07.1

## 2024-06-08 SURGERY — DESTRUCTION, LESION, GENITALIA
Anesthesia: General

## 2024-06-08 MED ORDER — OXYCODONE HCL 5 MG/5ML PO SOLN: 5.0000 mg | Freq: Once | ORAL | Status: AC | PRN

## 2024-06-08 MED ORDER — AMISULPRIDE (ANTIEMETIC) 5 MG/2ML IV SOLN: 10.0000 mg | Freq: Once | INTRAVENOUS | Status: DC | PRN

## 2024-06-08 MED ORDER — ORAL CARE MOUTH RINSE: 15.0000 mL | Freq: Once | OROMUCOSAL | Status: AC

## 2024-06-08 MED ORDER — DEXAMETHASONE SODIUM PHOSPHATE 4 MG/ML IJ SOLN
INTRAMUSCULAR | Status: DC | PRN
  Administered 2024-06-08: 5 mg via INTRAVENOUS

## 2024-06-08 MED ORDER — SILVER NITRATE-POT NITRATE 75-25 % EX MISC
CUTANEOUS | Status: DC | PRN
  Administered 2024-06-08: 1

## 2024-06-08 MED ORDER — CHLORHEXIDINE GLUCONATE 0.12 % MT SOLN
15.0000 mL | Freq: Once | OROMUCOSAL | Status: AC
  Administered 2024-06-08: 15 mL via OROMUCOSAL

## 2024-06-08 MED ORDER — ACETAMINOPHEN 500 MG PO TABS
ORAL_TABLET | ORAL | Status: AC
  Administered 2024-06-08: 1000 mg
  Filled 2024-06-08: qty 2

## 2024-06-08 MED ORDER — HYDROMORPHONE HCL 1 MG/ML IJ SOLN
INTRAMUSCULAR | Status: AC
  Filled 2024-06-08: qty 1

## 2024-06-08 MED ORDER — MIDAZOLAM HCL 2 MG/2ML IJ SOLN
INTRAMUSCULAR | Status: AC
  Filled 2024-06-08: qty 2

## 2024-06-08 MED ORDER — OXYCODONE HCL 5 MG PO TABS
5.0000 mg | ORAL_TABLET | Freq: Once | ORAL | Status: AC | PRN
  Administered 2024-06-08: 5 mg via ORAL

## 2024-06-08 MED ORDER — ONDANSETRON HCL 4 MG/2ML IJ SOLN
INTRAMUSCULAR | Status: DC | PRN
  Administered 2024-06-08: 4 mg via INTRAVENOUS

## 2024-06-08 MED ORDER — FENTANYL CITRATE (PF) 100 MCG/2ML IJ SOLN
INTRAMUSCULAR | Status: DC | PRN
  Administered 2024-06-08 (×3): 50 ug via INTRAVENOUS

## 2024-06-08 MED ORDER — BUPIVACAINE HCL (PF) 0.25 % IJ SOLN
INTRAMUSCULAR | Status: DC | PRN
  Administered 2024-06-08: 10 mL

## 2024-06-08 MED ORDER — FENTANYL CITRATE (PF) 100 MCG/2ML IJ SOLN
INTRAMUSCULAR | Status: AC
  Filled 2024-06-08: qty 2

## 2024-06-08 MED ORDER — LIDOCAINE HCL (CARDIAC) PF 100 MG/5ML IV SOSY
PREFILLED_SYRINGE | INTRAVENOUS | Status: DC | PRN
  Administered 2024-06-08: 80 mg via INTRAVENOUS

## 2024-06-08 MED ORDER — OXYCODONE HCL 5 MG PO TABS
ORAL_TABLET | ORAL | Status: AC
  Filled 2024-06-08: qty 1

## 2024-06-08 MED ORDER — LIDOCAINE HCL (PF) 2 % IJ SOLN
INTRAMUSCULAR | Status: AC
  Filled 2024-06-08: qty 5

## 2024-06-08 MED ORDER — HYDROMORPHONE HCL 1 MG/ML IJ SOLN
0.2500 mg | INTRAMUSCULAR | Status: DC | PRN
  Administered 2024-06-08 (×2): 0.5 mg via INTRAVENOUS

## 2024-06-08 MED ORDER — MONSELS FERRIC SUBSULFATE EX SOLN
CUTANEOUS | Status: AC
  Filled 2024-06-08: qty 8

## 2024-06-08 MED ORDER — LACTATED RINGERS IV SOLN: INTRAVENOUS | Status: DC

## 2024-06-08 MED ORDER — ONDANSETRON HCL 4 MG/2ML IJ SOLN
INTRAMUSCULAR | Status: AC
  Filled 2024-06-08: qty 2

## 2024-06-08 MED ORDER — PROPOFOL 10 MG/ML IV BOLUS
INTRAVENOUS | Status: DC | PRN
  Administered 2024-06-08: 200 mg via INTRAVENOUS

## 2024-06-08 MED ORDER — DEXAMETHASONE SOD PHOSPHATE PF 10 MG/ML IJ SOLN: 4.0000 mg | INTRAMUSCULAR | Status: DC

## 2024-06-08 MED ORDER — PROPOFOL 10 MG/ML IV BOLUS
INTRAVENOUS | Status: AC
  Filled 2024-06-08: qty 20

## 2024-06-08 MED ORDER — SILVER SULFADIAZINE 1 % EX CREA
1.0000 | TOPICAL_CREAM | Freq: Once | CUTANEOUS | Status: AC
  Administered 2024-06-08: 1 via TOPICAL
  Filled 2024-06-08: qty 50

## 2024-06-08 MED ORDER — ACETIC ACID 5 % SOLN
Status: AC
  Filled 2024-06-08: qty 48

## 2024-06-08 MED ORDER — MIDAZOLAM HCL 5 MG/5ML IJ SOLN
INTRAMUSCULAR | Status: DC | PRN
  Administered 2024-06-08 (×2): 1 mg via INTRAVENOUS

## 2024-06-08 MED ORDER — MEPERIDINE HCL 100 MG/ML IJ SOLN: 6.2500 mg | INTRAMUSCULAR | Status: DC | PRN

## 2024-06-08 MED ORDER — IODINE STRONG (LUGOLS) 5 % PO SOLN
ORAL | Status: AC
Start: 2024-06-08 — End: 2024-06-08
  Filled 2024-06-08: qty 1

## 2024-06-08 MED ORDER — SILVER SULFADIAZINE 1 % EX CREA: 1.0000 | TOPICAL_CREAM | Freq: Two times a day (BID) | CUTANEOUS | Status: AC | PRN

## 2024-06-08 MED ORDER — SILVER NITRATE-POT NITRATE 75-25 % EX MISC
CUTANEOUS | Status: AC
Start: 2024-06-08 — End: 2024-06-08
  Filled 2024-06-08: qty 10

## 2024-06-08 MED ORDER — ACETIC ACID 5 % SOLN
Status: DC | PRN
  Administered 2024-06-08: 1 via TOPICAL

## 2024-06-08 SURGICAL SUPPLY — 30 items
BLADE SURG 15 STRL LF DISP TIS (BLADE) IMPLANT
DEPRESSOR TONGUE 6 IN STERILE (GAUZE/BANDAGES/DRESSINGS) ×2 IMPLANT
DRAPE SHEET LG 3/4 BI-LAMINATE (DRAPES) IMPLANT
DRAPE UNDERBUTTOCKS STRL (DISPOSABLE) IMPLANT
DRSG TELFA 3X8 NADH STRL (GAUZE/BANDAGES/DRESSINGS) IMPLANT
ELECT BALL LEEP 3MM BLK (ELECTRODE) IMPLANT
GAUZE 4X4 16PLY ~~LOC~~+RFID DBL (SPONGE) ×2 IMPLANT
GLOVE BIOGEL PI MICRO STRL 6 (GLOVE) ×8 IMPLANT
GOWN STRL REUS W/ TWL LRG LVL3 (GOWN DISPOSABLE) ×2 IMPLANT
KIT BASIN OR (CUSTOM PROCEDURE TRAY) ×2 IMPLANT
NDL HYPO 22X1.5 SAFETY MO (MISCELLANEOUS) ×2 IMPLANT
NDL SPNL 22GX7 QUINCKE BK (NEEDLE) IMPLANT
NEEDLE HYPO 22X1.5 SAFETY MO (MISCELLANEOUS) ×1 IMPLANT
NEEDLE SPNL 22GX7 QUINCKE BK (NEEDLE) IMPLANT
PACK LITHOTOMY IV (CUSTOM PROCEDURE TRAY) ×2 IMPLANT
PAD PREP 24X48 CUFFED NSTRL (MISCELLANEOUS) ×2 IMPLANT
PUNCH BIOPSY 3 (MISCELLANEOUS) IMPLANT
SCOPETTES 8 STERILE (MISCELLANEOUS) IMPLANT
SOL PREP POV-IOD 4OZ 10% (MISCELLANEOUS) ×2 IMPLANT
SUT VIC AB 0 CT1 36 (SUTURE) IMPLANT
SUT VIC AB 2-0 SH 27XBRD (SUTURE) IMPLANT
SUT VIC AB 3-0 PS2 18XBRD (SUTURE) IMPLANT
SUT VIC AB 3-0 SH 27X BRD (SUTURE) IMPLANT
SUT VIC AB 4-0 PS2 18 (SUTURE) IMPLANT
SUT VICRYL 0 UR6 27IN ABS (SUTURE) IMPLANT
SYR CONTROL 10ML LL (SYRINGE) ×2 IMPLANT
TOWEL OR 17X26 10 PK STRL BLUE (TOWEL DISPOSABLE) ×2 IMPLANT
TUBING CONNECTING 10 (TUBING) IMPLANT
VACUUM HOSE 7/8X10 W/ WAND (MISCELLANEOUS) ×2 IMPLANT
WATER STERILE IRR 500ML POUR (IV SOLUTION) ×2 IMPLANT

## 2024-06-08 NOTE — Transfer of Care (Signed)
 Immediate Anesthesia Transfer of Care Note  Patient: Tiffany Norton  Procedure(s) Performed: DESTRUCTION, LESION, GENITALIA CO2 LASER APPLICATION BIOPSY, VULVA  Patient Location: PACU  Anesthesia Type:General  Level of Consciousness: awake, alert , and oriented  Airway & Oxygen Therapy: Patient Spontanous Breathing and Patient connected to face mask oxygen  Post-op Assessment: Report given to RN and Post -op Vital signs reviewed and stable  Post vital signs: Reviewed and stable  Last Vitals:  Vitals Value Taken Time  BP 156/79 06/08/24 09:45  Temp 36.4 C 06/08/24 09:45  Pulse 72 06/08/24 09:49  Resp 16 06/08/24 09:49  SpO2 98 % 06/08/24 09:49  Vitals shown include unfiled device data.  Last Pain:  Vitals:   06/08/24 0658  TempSrc:   PainSc: 0-No pain         Complications: No notable events documented.

## 2024-06-08 NOTE — Interval H&P Note (Signed)
 History and Physical Interval Note:  06/08/2024 7:28 AM  Tiffany Norton  has presented today for surgery, with the diagnosis of Vulvar dysplasia.  The various methods of treatment have been discussed with the patient and family. After consideration of risks, benefits and other options for treatment, the patient has consented to  Procedure(s) with comments: DESTRUCTION, LESION, GENITALIA (N/A) - CARBON DIOXIDE LASER APPLICATION TO THE VULVA CO2 LASER APPLICATION (N/A) BIOPSY, VULVA (N/A) as a surgical intervention.  The patient's history has been reviewed, patient examined, no change in status, stable for surgery.  I have reviewed the patient's chart and labs.  Questions were answered to the patient's satisfaction.     Electa Sterry

## 2024-06-08 NOTE — Anesthesia Procedure Notes (Signed)
 Procedure Name: LMA Insertion Date/Time: 06/08/2024 8:58 AM  Performed by: Buster Catheryn SAUNDERS, CRNAPre-anesthesia Checklist: Patient identified, Emergency Drugs available, Suction available and Patient being monitored Patient Re-evaluated:Patient Re-evaluated prior to induction Oxygen Delivery Method: Circle system utilized Preoxygenation: Pre-oxygenation with 100% oxygen Induction Type: IV induction Ventilation: Mask ventilation without difficulty LMA: LMA inserted LMA Size: 4.0 Number of attempts: 1 Placement Confirmation: positive ETCO2 Tube secured with: Tape Dental Injury: Teeth and Oropharynx as per pre-operative assessment

## 2024-06-08 NOTE — Anesthesia Postprocedure Evaluation (Signed)
 Anesthesia Post Note  Patient: Tiffany Norton  Procedure(s) Performed: DESTRUCTION, LESION, GENITALIA CO2 LASER APPLICATION BIOPSY, VULVA     Patient location during evaluation: PACU Anesthesia Type: General Level of consciousness: awake and alert Pain management: pain level controlled Vital Signs Assessment: post-procedure vital signs reviewed and stable Respiratory status: spontaneous breathing, nonlabored ventilation and respiratory function stable Cardiovascular status: blood pressure returned to baseline and stable Postop Assessment: no apparent nausea or vomiting Anesthetic complications: no   No notable events documented.  Last Vitals:  Vitals:   06/08/24 1030 06/08/24 1039  BP: (!) 125/91 (!) 146/91  Pulse: 76 75  Resp: 16 16  Temp:  36.4 C  SpO2: 92% 97%    Last Pain:  Vitals:   06/08/24 1039  TempSrc: Oral  PainSc: 4                  Butler Levander Pinal

## 2024-06-08 NOTE — Anesthesia Preprocedure Evaluation (Addendum)
 Anesthesia Evaluation  Patient identified by MRN, date of birth, ID band Patient awake    Reviewed: Allergy  & Precautions, NPO status , Patient's Chart, lab work & pertinent test results  History of Anesthesia Complications Negative for: history of anesthetic complications  Airway Mallampati: III  TM Distance: >3 FB Neck ROM: Full    Dental  (+) Dental Advisory Given,    Pulmonary neg shortness of breath, neg sleep apnea, neg COPD, neg recent URI, former smoker   Pulmonary exam normal breath sounds clear to auscultation       Cardiovascular hypertension, Pt. on medications (-) angina (-) Past MI, (-) Cardiac Stents and (-) CABG (-) dysrhythmias  Rhythm:Regular Rate:Normal  HLD   Neuro/Psych  PSYCHIATRIC DISORDERS  Depression    negative neurological ROS     GI/Hepatic Neg liver ROS,GERD  Medicated,,IBS   Endo/Other  neg diabetes  Class 3 obesityPrimary hyperparathyroidism  Renal/GU negative Renal ROS     Musculoskeletal  (+) Arthritis ,    Abdominal  (+) + obese  Peds  Hematology negative hematology ROS (+) Lab Results      Component                Value               Date                      WBC                      7.9                 04/28/2023                HGB                      15.1 (H)            04/28/2023                HCT                      45.3                04/28/2023                MCV                      86.0                04/28/2023                PLT                      393                 04/28/2023              Anesthesia Other Findings   Reproductive/Obstetrics                              Anesthesia Physical Anesthesia Plan  ASA: 3  Anesthesia Plan: General   Post-op Pain Management: Tylenol  PO (pre-op)*   Induction: Intravenous  PONV Risk Score and Plan: 3 and Ondansetron , Dexamethasone , Treatment may vary due to age or medical condition and  Midazolam  Airway Management Planned: LMA  Additional  Equipment:   Intra-op Plan:   Post-operative Plan: Extubation in OR  Informed Consent: I have reviewed the patients History and Physical, chart, labs and discussed the procedure including the risks, benefits and alternatives for the proposed anesthesia with the patient or authorized representative who has indicated his/her understanding and acceptance.     Dental advisory given  Plan Discussed with: CRNA and Anesthesiologist  Anesthesia Plan Comments: (Risks of general anesthesia discussed including, but not limited to, sore throat, hoarse voice, chipped/damaged teeth, injury to vocal cords, nausea and vomiting, allergic reactions, lung infection, heart attack, stroke, and death. All questions answered. )         Anesthesia Quick Evaluation

## 2024-06-08 NOTE — Op Note (Signed)
 GYNECOLOGIC ONCOLOGY OPERATIVE NOTE  Date of Service: 06/08/2024  Preoperative Diagnosis: Vulvar dysplasia, lichen sclerosus  Postoperative Diagnosis: Same  Procedures: CO2 laser ablation of the vulva, vulvar biopsies  Surgeon: [name], MD  Assistants: None  Anesthesia: General With LMA  Estimated Blood Loss: 5 mL   Fluids: 850 ml, crystalloid  Findings: External genitalia with whitening and inflamed changes in a figure-of-eight fashion around the introitus and the anus onto nearby skin on the buttocks.  Subcentimeter raised white lesion on the right posterior fourchette across from the prior left posterior fourchette biopsy site.  With acetic acid applied, acetowhite changes of the posterior fourchette as well as some additional acetowhite changes of the inferior aspect of the right labia minora and on the posterior left labia majora.  Specimens:  ID Type Source Tests Collected by Time Destination  1 : right perineal body biopsy Tissue PATH Gyn biopsy SURGICAL PATHOLOGY Masters Hoy, MD 06/08/2024 0914   2 : right labia minora biopsy Tissue PATH Gyn biopsy SURGICAL PATHOLOGY Masters Hoy, MD 06/08/2024 [phone]   3 : left posterior labia majora biopsy Tissue PATH Gyn biopsy SURGICAL PATHOLOGY Masters Hoy, MD 06/08/2024 0920     Complications:  None  Indications for Procedure: Tiffany Norton is a 68 y.o. woman with evidence of lichenoid dermatitis and vulvar dysplasia.  Prior to the procedure, all risks, benefits, and alternatives were discussed and informed surgical consent was signed.  Procedure: Patient was taken to the operating room where general anesthesia with LMA was achieved.  She was positioned in dorsal lithotomy and prepped and draped in sterile fashion.    A thorough exam of the vulva was done after placing a moist sponge with acetic acid on the vulva with the findings as noted above. Using the Tischler biopsy forceps, 3 biopsies were obtained of  areas with acetowhite changes that had not previously biopsied.  Following these biopsies, the areas to be removed was outlined to a 5mm margin.  This included the right posterior labia minora, the posterior fourchette, and the left posterior labia majora. Using the CO2 laser, energy was applied with 8 Watts of continuous power.  The ablation was performed down to a second surgical layer.  The wound bed was hemostatic.  1 figure-of-eight stitch with 3-0 Vicryl was placed at the biopsy site on the left posterior labia majora with hemostasis achieved.  Silvadene cream was applied to the wound bed.   Patient tolerated the procedure well. Sponge, lap, and instrument counts were correct.  No perioperative antibiotics were indicated for this procedure.  Patient was extubated and taken to the PACU in stable condition.  Patient was discharged with Silvadene cream.  [name], MD Gynecologic Oncology

## 2024-06-09 ENCOUNTER — Telehealth: Payer: Self-pay | Admitting: *Deleted

## 2024-06-09 ENCOUNTER — Encounter (HOSPITAL_COMMUNITY): Payer: Self-pay | Admitting: Psychiatry

## 2024-06-09 LAB — SURGICAL PATHOLOGY

## 2024-06-09 NOTE — Telephone Encounter (Signed)
 Spoke with Ms. Lober this morning. She states she is eating, drinking and urinating well. She has not had a BM yet but is passing gas. She is taking senokot as prescribed and encouraged her to drink plenty of water. She denies fever or chills. Incisions are dry and intact. She rates her pain 2/10. Her pain is controlled with oxycodone  once last night, and patient is going to try just using tylenol  today.     Instructed to call office with any fever, chills, purulent drainage, uncontrolled pain or any other questions or concerns. Patient verbalizes understanding.   Pt aware of post op appointments as well as the office number [phone] and after hours number [phone] to call if she has any questions or concerns

## 2024-06-14 ENCOUNTER — Ambulatory Visit: Payer: Self-pay | Admitting: Psychiatry

## 2024-06-16 DIAGNOSIS — L821 Other seborrheic keratosis: Secondary | ICD-10-CM | POA: Diagnosis not present

## 2024-06-16 DIAGNOSIS — L304 Erythema intertrigo: Secondary | ICD-10-CM | POA: Diagnosis not present

## 2024-06-16 DIAGNOSIS — D485 Neoplasm of uncertain behavior of skin: Secondary | ICD-10-CM | POA: Diagnosis not present

## 2024-06-17 ENCOUNTER — Encounter: Payer: Self-pay | Admitting: Psychiatry

## 2024-06-21 ENCOUNTER — Inpatient Hospital Stay: Payer: Self-pay | Attending: Psychiatry | Admitting: Psychiatry

## 2024-06-21 ENCOUNTER — Encounter: Payer: Self-pay | Admitting: Obstetrics and Gynecology

## 2024-06-21 ENCOUNTER — Encounter: Payer: Self-pay | Admitting: Psychiatry

## 2024-06-21 VITALS — BP 140/76 | HR 98 | Temp 98.7°F | Resp 18 | Wt 260.4 lb

## 2024-06-21 DIAGNOSIS — D071 Carcinoma in situ of vulva: Secondary | ICD-10-CM | POA: Insufficient documentation

## 2024-06-21 DIAGNOSIS — Z9889 Other specified postprocedural states: Secondary | ICD-10-CM | POA: Diagnosis not present

## 2024-06-21 DIAGNOSIS — Z7189 Other specified counseling: Secondary | ICD-10-CM

## 2024-06-21 DIAGNOSIS — N903 Dysplasia of vulva, unspecified: Secondary | ICD-10-CM

## 2024-06-21 NOTE — Patient Instructions (Signed)
 It was a pleasure to see you in clinic today. - Healing well - Okay to resume steroid cream (lotrisone ) - Return visit planned for 6 months  Thank you very much for allowing me to provide care for you today.  I appreciate your confidence in choosing our Gynecologic Oncology team at [facility].  If you have any questions about your visit today please call our office or send us  a MyChart message and we will get back to you as soon as possible.

## 2024-06-21 NOTE — Progress Notes (Signed)
 Gynecologic Oncology Return Clinic Visit  Date of Service: 06/21/2024 Referring Provider: Bobie Cathlyn Cary, MD   Assessment & Plan: Tiffany Norton is a 68 y.o. woman with vulvar dysplasia and extensive lichen sclerosus who is s/p CO2 laser ablation of the vulva and vulvar biopsies on 06/08/2024.  Postop: - Pt recovering well from surgery and healing appropriately postoperatively - Ongoing postoperative expectations and precautions reviewed.   VIN3: - Reviewed final pathology in detail. - No evidence of invasive cancer.  - Additional biopsies which show chronic inflammation and hyperkeratosis. - Reviewed signs/symptoms of recurrence.  - Surveillance reviewed. Follow-up q6 months x2 years then annually.  Okay to alternate with her local OB/GYN.  Vulvar dermatitis: - Consistent with lichen sclerosis. - Can resume Lotrisone  cream.   RTC 109mo.  [name], MD Gynecologic Oncology   Medical Decision Making I personally spent  TOTAL 15 minutes face-to-face and non-face-to-face in the care of this patient, which includes all pre, intra, and post visit time on the date of service. The discussion of management of VIN3 is beyond the scope of routine postoperative care.   ----------------------- Reason for Visit: Postop/Treatment discussion  Treatment History: Patient was seen by GYN on 02/16/2024 for note of skin nonirritated around buttocks and vagina.  This was reported for about 6 weeks.  She previously had a vulvar biopsy in 10/30/2021 that noted lichenoid dermatitis with possible early lichen sclerosis.  She had previously received a prescription for clobetasol  but had stopped using this due to burning and itching.  On exam she was noted to have erythema and pale coloration of the vulva with macerated tissue in the right groin.  Given concern for possible yeast a prescription for Diflucan  was sent.  She was also given a prescription for estradiol  cream.  She returned on  03/22/2024.  On exam at that time she was noted to have thickening and white change of skin of the perineum.  She was given a prescription for Lotrisone  to use twice daily for 2 to 3 weeks and continue vaginal estrogen cream.  At follow-up on 05/10/2024 she underwent vulvar biopsy with a punch biopsy of the left perianal and the left perineal areas.  The left perianal biopsy returned with lichen sclerosis.  The left perineal biopsy returned with squamous cell carcinoma in situ   Interval History: Pt reports that she is recovering well from surgery. She is using tylenol  prn for pain. She is eating and drinking well. She is voiding without issue and having regular bowel movements. No bleeding.   Past Medical/Surgical History: Past Medical History:  Diagnosis Date   Acid reflux    Arthritis    Back pain    Bilateral swelling of feet    CIN I (cervical intraepithelial neoplasia I)    Constipation    Depression    Elevated cholesterol    Hyperlipidemia    Hypertension    IBS (irritable bowel syndrome)    Joint pain    Knee pain    Obesity    Plantar fasciitis    PMDD (premenstrual dysphoric disorder)    Prediabetes    Serum calcium elevated    Normal PTH    Past Surgical History:  Procedure Laterality Date   ANAL FISSURE REPAIR     ANKLE SURGERY Right    Arm Surg Right    CERVICAL BIOPSY  W/ LOOP ELECTRODE EXCISION  08/19/2004   CIN l   CESAREAN SECTION     CO2 LASER APPLICATION N/A  06/08/2024   Procedure: CO2 LASER APPLICATION;  Surgeon: Eldonna Mays, MD;  Location: [facility];  Service: Gynecology;  Laterality: N/A;   COLPOSCOPY     LESION DESTRUCTION N/A 06/08/2024   Procedure: DESTRUCTION, LESION, GENITALIA;  Surgeon: Eldonna Mays, MD;  Location: [facility];  Service: Gynecology;  Laterality: N/A;  CARBON DIOXIDE LASER APPLICATION TO THE VULVA   PARATHYROID  EXPLORATION N/A 05/15/2023   Procedure: RIGHT INFERIOR PARATHYROIDECTOMY;  Surgeon: Kinsinger, Herlene Righter, MD;   Location: [facility];  Service: General;  Laterality: N/A;   TUBAL LIGATION Bilateral    VULVA MARYBETH BIOPSY N/A 06/08/2024   Procedure: BIOPSY, VULVA;  Surgeon: Eldonna Mays, MD;  Location: [facility];  Service: Gynecology;  Laterality: N/A;    Family History  Problem Relation Age of Onset   Hypertension Mother    Colon cancer Mother        late 60s/early 62s   Depression Mother    Obesity Mother    Aortic aneurysm Father    Hyperlipidemia Father    Sudden death Father    Breast cancer Niece    Endometrial cancer Neg Hx    Ovarian cancer Neg Hx     Social History   Socioeconomic History   Marital status: Married    Spouse name: Not on file   Number of children: Not on file   Years of education: Not on file   Highest education level: Not on file  Occupational History   Not on file  Tobacco Use   Smoking status: Former    Current packs/day: 0.00    Types: Cigarettes    Quit date: 2015    Years since quitting: 10.8    Passive exposure: Never   Smokeless tobacco: Never  Vaping Use   Vaping status: Never Used  Substance and Sexual Activity   Alcohol use: Yes    Alcohol/week: 2.0 standard drinks of alcohol    Types: 2 Glasses of wine per week    Comment: occas   Drug use: No   Sexual activity: Yes    Partners: Male    Birth control/protection: Surgical    Comment: intercourse early 20's, less than 5 sexual partners  Other Topics Concern   Not on file  Social History Narrative   Not on file   Social Drivers of Health   Financial Resource Strain: Not on file  Food Insecurity: No Food Insecurity (05/14/2024)   Hunger Vital Sign    Worried About Running Out of Food in the Last Year: Never true    Ran Out of Food in the Last Year: Never true  Transportation Needs: No Transportation Needs (05/14/2024)   PRAPARE - Administrator, Civil Service (Medical): No    Lack of Transportation (Non-Medical): No  Physical Activity: Not on file  Stress: Not on file   Social Connections: Not on file    Current Medications:  Current Outpatient Medications:    cetirizine (ZYRTEC) 10 MG tablet, Take 10 mg by mouth daily., Disp: , Rfl:    EPINEPHrine  0.3 mg/0.3 mL IJ SOAJ injection, Inject 0.3 mg into the muscle as needed for anaphylaxis., Disp: 1 each, Rfl: 1   fluticasone  (FLONASE ) 50 MCG/ACT nasal spray, Place 1 spray into both nostrils daily., Disp: 16 g, Rfl: 2   Multiple Vitamin (MULTIVITAMIN WITH MINERALS) TABS tablet, Take 1 tablet by mouth daily., Disp: , Rfl:    nystatin -triamcinolone  (MYCOLOG II) cream, Apply 1 Application topically 2 (two) times daily as needed (itching).,  Disp: , Rfl:    omeprazole (PRILOSEC) 20 MG capsule, Take 20 mg by mouth daily., Disp: , Rfl:    silver sulfADIAZINE (SILVADENE) 1 % cream, Apply 1 Application topically 2 (two) times daily as needed (apply to the vulva as needed for pain)., Disp: , Rfl:    telmisartan  (MICARDIS ) 20 MG tablet, Take 0.5 tablets (10 mg total) by mouth daily., Disp: 45 tablet, Rfl: 0   venlafaxine  (EFFEXOR ) 75 MG tablet, Take 75 mg by mouth daily., Disp: , Rfl:   Review of Symptoms: Complete 10-system review is positive for: Urinary frequency  Physical Exam: BP (!) 144/75 (BP Location: Left Arm, Patient Position: Sitting)   Pulse 98   Temp 98.7 F (37.1 C) (Oral)   Resp 18   Wt 260 lb 6.4 oz (118.1 kg)   SpO2 98%   BMI 43.33 kg/m  General: Alert, oriented, no acute distress. HEENT: Normocephalic, atraumatic. Neck symmetric without masses. Sclera anicteric.  Chest: Normal work of breathing.  Extremities: Grossly normal range of motion.  Warm, well perfused.   Skin: No rashes or lesions noted. GU: External genitalia healing well from recent laser procedure.  Resolving stitches from biopsy sites.  Otherwise still has figure-of-eight fashion area of whitening and erythema around the vulva and anus.  Exam chaperoned by Kimberly Jordan, CMA   Laboratory & Radiologic Studies: Surgical  pathology (06/08/24): A. RIGHT PERINEAL BODY, BIOPSY:  Severe squamous cell dysplasia (HSIL, VIN 3)   B. RIGHT LABIA MINORA, BIOPSY:  Benign squamous mucosa with mild chronic inflammation  Negative for dysplasia and diagnostic viral change   C. LEFT POSTERIOR LABIA MAJORA, BIOPSY:  Benign squamous mucosa with chronic inflammation and hyperkeratosis  Negative for dysplasia and diagnostic viral change

## 2024-06-22 ENCOUNTER — Telehealth: Payer: Self-pay

## 2024-06-22 MED ORDER — CLOTRIMAZOLE-BETAMETHASONE 1-0.05 % EX CREA: TOPICAL_CREAM | Freq: Two times a day (BID) | CUTANEOUS | Status: DC

## 2024-06-22 MED ORDER — CLOTRIMAZOLE-BETAMETHASONE 1-0.05 % EX CREA: 1.0000 | TOPICAL_CREAM | Freq: Two times a day (BID) | CUTANEOUS | 1 refills | Status: DC

## 2024-06-22 NOTE — Telephone Encounter (Signed)
 I sent refills of Lotrisone  cream to her pharmacy.

## 2024-06-22 NOTE — Telephone Encounter (Signed)
 Patient is requesting refill via mychart of Clotrimazole  betamethasone  cream.  Last OV:  05/10/24 Next AEX: 07/13/24 Last MMG (if hormonal medication request): n/a Refill authorized: please advise.

## 2024-06-22 NOTE — Telephone Encounter (Signed)
 Sent message to provider for refill.

## 2024-07-13 ENCOUNTER — Encounter: Payer: Self-pay | Admitting: Obstetrics and Gynecology

## 2024-07-13 ENCOUNTER — Other Ambulatory Visit (HOSPITAL_COMMUNITY)
Admission: RE | Admit: 2024-07-13 | Discharge: 2024-07-13 | Disposition: A | Discharge status: Home / Self Care | Source: Ambulatory Visit | Attending: Obstetrics and Gynecology | Admitting: Obstetrics and Gynecology

## 2024-07-13 ENCOUNTER — Ambulatory Visit: Admitting: Obstetrics and Gynecology

## 2024-07-13 VITALS — BP 116/80 | HR 94 | Ht 66.0 in | Wt 259.0 lb

## 2024-07-13 DIAGNOSIS — Z01419 Encounter for gynecological examination (general) (routine) without abnormal findings: Secondary | ICD-10-CM | POA: Diagnosis present

## 2024-07-13 DIAGNOSIS — Z87410 Personal history of cervical dysplasia: Secondary | ICD-10-CM | POA: Diagnosis not present

## 2024-07-13 DIAGNOSIS — Z9289 Personal history of other medical treatment: Secondary | ICD-10-CM

## 2024-07-13 DIAGNOSIS — Z124 Encounter for screening for malignant neoplasm of cervix: Secondary | ICD-10-CM

## 2024-07-13 DIAGNOSIS — Z1151 Encounter for screening for human papillomavirus (HPV): Secondary | ICD-10-CM | POA: Insufficient documentation

## 2024-07-13 DIAGNOSIS — Z780 Asymptomatic menopausal state: Secondary | ICD-10-CM | POA: Diagnosis not present

## 2024-07-13 DIAGNOSIS — D071 Carcinoma in situ of vulva: Secondary | ICD-10-CM | POA: Diagnosis not present

## 2024-07-13 DIAGNOSIS — M8589 Other specified disorders of bone density and structure, multiple sites: Secondary | ICD-10-CM

## 2024-07-13 NOTE — Progress Notes (Unsigned)
 68 y.o. [id number] Married Caucasian female here for a breast and pelvic exam.    The patient is also followed for vulvar dysplasia and lichen sclerosus. She has Lotrisone  and has refills from her PCP.   Status post CO2 laser ablation of the vulva with vulvar biopsies 06/08/24 by Dr. Eldonna from [facility].   Right perineal biopsy showed VIN III.   Has a follow up appt in May with [facility].  Has a dermatologist, Burnadette Clause, PA-C.  She has an appointment in December, 2025.    Has overactive bladder.  Wears a pad.  She will do Interstim with Dr. Gaston.    PCP: Delayne Artist PARAS, MD   No LMP recorded. Patient is postmenopausal.           Sexually active: Yes.    The current method of family planning is post menopausal status.    Menopausal hormone therapy:  n/a Exercising: No.   Smoker:  Former   OB History     Gravida  1   Para  1   Term  1   Preterm      AB      Living  1      SAB      IAB      Ectopic      Multiple      Live Births  1           HEALTH MAINTENANCE: Last 2 paps: 09/19/20 neg HR HPV neg, 10/27/14 neg  History of abnormal Pap or positive HPV:  unsure, CIN I on chart review, has VIN III.  Mammogram:  10/01/23 Breast density Cat C, BIRADS Cat 1 neg  Colonoscopy:  2017 normal;does every 5 years due to family history colon cancer--mom--Dr. Rosalie.   States she is up to date.  Bone Density:  11/13/21  Result  low bone mass spine and left forearm.   Immunization History  Administered Date(s) Administered   Influenza,inj,quad, With Preservative 05/20/2019   Influenza-Unspecified 04/18/2017, 06/11/2020      reports that she quit smoking about 10 years ago. Her smoking use included cigarettes. She has never been exposed to tobacco smoke. She has never used smokeless tobacco. She reports current alcohol use of about 2.0 standard drinks of alcohol per week. She reports that she does not use drugs.  Past Medical History:  Diagnosis Date   Acid  reflux    Arthritis    Back pain    Bilateral swelling of feet    CIN I (cervical intraepithelial neoplasia I)    Constipation    Depression    Elevated cholesterol    Hyperlipidemia    Hypertension    IBS (irritable bowel syndrome)    Joint pain    Knee pain    Obesity    Plantar fasciitis    PMDD (premenstrual dysphoric disorder)    Prediabetes    Serum calcium elevated    Normal PTH   VIN III (vulvar intraepithelial neoplasia III) 06/08/2024    Past Surgical History:  Procedure Laterality Date   ANAL FISSURE REPAIR     ANKLE SURGERY Right    Arm Surg Right    CERVICAL BIOPSY  W/ LOOP ELECTRODE EXCISION  08/19/2004   CIN l   CESAREAN SECTION     CO2 LASER APPLICATION N/A 06/08/2024   Procedure: CO2 LASER APPLICATION;  Surgeon: Eldonna Mays, MD;  Location: [facility];  Service: Gynecology;  Laterality: N/A;   COLPOSCOPY  LESION DESTRUCTION N/A 06/08/2024   Procedure: DESTRUCTION, LESION, GENITALIA;  Surgeon: Eldonna Mays, MD;  Location: [facility];  Service: Gynecology;  Laterality: N/A;  CARBON DIOXIDE LASER APPLICATION TO THE VULVA   PARATHYROID  EXPLORATION N/A 05/15/2023   Procedure: RIGHT INFERIOR PARATHYROIDECTOMY;  Surgeon: Kinsinger, Herlene Righter, MD;  Location: [facility];  Service: General;  Laterality: N/A;   TUBAL LIGATION Bilateral    VULVA MARYBETH BIOPSY N/A 06/08/2024   Procedure: BIOPSY, VULVA;  Surgeon: Eldonna Mays, MD;  Location: [facility];  Service: Gynecology;  Laterality: N/A;    Current Outpatient Medications  Medication Sig Dispense Refill   cetirizine (ZYRTEC) 10 MG tablet Take 10 mg by mouth daily.     clotrimazole  (LOTRIMIN ) 1 % cream 1 Application.     EPINEPHrine  0.3 mg/0.3 mL IJ SOAJ injection Inject 0.3 mg into the muscle as needed for anaphylaxis. 1 each 1   Multiple Vitamin (MULTIVITAMIN WITH MINERALS) TABS tablet Take 1 tablet by mouth daily.     nystatin -triamcinolone  (MYCOLOG II) cream Apply 1 Application topically 2 (two) times  daily as needed (itching).     omeprazole (PRILOSEC) 20 MG capsule Take 20 mg by mouth daily.     telmisartan  (MICARDIS ) 20 MG tablet Take 0.5 tablets (10 mg total) by mouth daily. 45 tablet 0   tirzepatide (ZEPBOUND) 12.5 MG/0.5ML Pen Inject 12.5 mg into the skin once a week.     venlafaxine  (EFFEXOR ) 75 MG tablet Take 75 mg by mouth daily.     silver  sulfADIAZINE  (SILVADENE ) 1 % cream Apply 1 Application topically 2 (two) times daily as needed (apply to the vulva as needed for pain). (Patient not taking: Reported on 07/13/2024)     No current facility-administered medications for this visit.    ALLERGIES: Etodolac, Nsaids, Aspirin, and Iodine   Family History  Problem Relation Age of Onset   Hypertension Mother    Colon cancer Mother        late 60s/early 52s   Depression Mother    Obesity Mother    Aortic aneurysm Father    Hyperlipidemia Father    Sudden death Father    Breast cancer Niece    Endometrial cancer Neg Hx    Ovarian cancer Neg Hx     Review of Systems  All other systems reviewed and are negative.   PHYSICAL EXAM:  BP 116/80 (BP Location: Left Arm, Patient Position: Sitting)   Pulse 94   Ht 5' 6 (1.676 m)   Wt 259 lb (117.5 kg)   SpO2 98%   BMI 41.80 kg/m     General appearance: alert, cooperative and appears stated age Head: normocephalic, without obvious abnormality, atraumatic Neck: no adenopathy, supple, symmetrical, trachea midline and thyroid normal to inspection and palpation Lungs: clear to auscultation bilaterally Breasts: normal appearance, no masses or tenderness, No nipple retraction or dimpling, No nipple discharge or bleeding, No axillary adenopathy Heart: regular rate and rhythm Abdomen: soft, non-tender; no masses, no organomegaly Extremities: extremities normal, atraumatic, no cyanosis or edema Skin: skin color, texture, turgor normal. No rashes or lesions Lymph nodes: cervical, supraclavicular, and axillary nodes normal. Neurologic:  grossly normal  Pelvic: External genitalia:  white skin change of the supraclitoral region and perianal region.                No abnormal inguinal nodes palpated.              Urethra:  normal appearing urethra with no masses, tenderness or lesions  Bartholins and Skenes: normal                 Vagina: normal appearing vagina with normal color and discharge, no lesions              Cervix: no lesions              Pap taken: yes Bimanual Exam:  Uterus:  normal size, contour, position, consistency, mobility, non-tender              Adnexa: no mass, fullness, tenderness              Rectal exam: yes.  Confirms.              Anus:  normal sphincter tone, large area of white skin change and surrounded by red erythema.    Chaperone was present for exam:  Clotilda DEL, CMA  ASSESSMENT: Encounter for breast and pelvic exam.  Remote hx CIN I.  Status post LEEP 2006.  CIN I. Chronic vulvitis.  Candida of flexural folds. Encounter for medication monitoring. Osteopenia.  Elevated calcium.  Normal PTH.  Calcium has returned to normal. Depression.  On Effexor . FH colon metastatic to ovary in mother.    ***  PLAN: Mammogram screening discussed. Self breast awareness reviewed. Pap and HRV collected:  yes Guidelines for Calcium, Vitamin D , regular exercise program including cardiovascular and weight bearing exercise. Medication refills:  NA Will do BMD at [facility].   She will follow up with dermatology and with [facility]. Follow up:  yearly     Additional counseling given.  yes. ***  total time was spent for this patient encounter, including preparation, face-to-face counseling with the patient, coordination of care, and documentation of the encounter in addition to doing the breast and pelvic exam.

## 2024-07-13 NOTE — Patient Instructions (Signed)

## 2024-07-14 ENCOUNTER — Ambulatory Visit: Payer: Self-pay | Admitting: Obstetrics and Gynecology

## 2024-07-14 LAB — CYTOLOGY - PAP
Comment: NEGATIVE
Diagnosis: NEGATIVE
High risk HPV: NEGATIVE

## 2024-08-25 ENCOUNTER — Encounter: Payer: Self-pay | Admitting: Obstetrics and Gynecology

## 2024-08-26 NOTE — Telephone Encounter (Signed)
 Order faxed & confirmation received. Patient is aware.

## 2024-12-20 ENCOUNTER — Inpatient Hospital Stay: Admitting: Psychiatry

## 2025-07-21 ENCOUNTER — Encounter: Admitting: Obstetrics and Gynecology
